# Patient Record
Sex: Male | Born: 1970 | Race: White | Hispanic: No | Marital: Married | Smoking: Never smoker
Health system: Southern US, Community
[De-identification: ages and names within clinical notes are randomized; demographics above are authoritative.]

## PROBLEM LIST (undated history)

## (undated) ENCOUNTER — Ambulatory Visit

## (undated) ENCOUNTER — Telehealth

## (undated) ENCOUNTER — Encounter: Attending: Hematology & Oncology | Primary: Hematology & Oncology

## (undated) ENCOUNTER — Telehealth: Attending: Oncology | Primary: Oncology

## (undated) ENCOUNTER — Encounter

## (undated) ENCOUNTER — Encounter: Attending: Radiation Oncology | Primary: Radiation Oncology

## (undated) ENCOUNTER — Telehealth: Attending: Surgical Oncology | Primary: Surgical Oncology

## (undated) ENCOUNTER — Encounter: Attending: Oncology | Primary: Oncology

## (undated) ENCOUNTER — Telehealth: Attending: Hematology & Oncology | Primary: Hematology & Oncology

## (undated) ENCOUNTER — Telehealth
Attending: Student in an Organized Health Care Education/Training Program | Primary: Student in an Organized Health Care Education/Training Program

## (undated) ENCOUNTER — Encounter: Attending: Surgery | Primary: Surgery

## (undated) ENCOUNTER — Telehealth: Attending: Radiation Oncology | Primary: Radiation Oncology

## (undated) ENCOUNTER — Ambulatory Visit: Payer: PRIVATE HEALTH INSURANCE

## (undated) ENCOUNTER — Ambulatory Visit: Payer: PRIVATE HEALTH INSURANCE | Attending: Surgical Oncology | Primary: Surgical Oncology

## (undated) ENCOUNTER — Ambulatory Visit: Payer: MEDICARE | Attending: Hematology & Oncology | Primary: Hematology & Oncology

## (undated) ENCOUNTER — Ambulatory Visit: Attending: Radiation Oncology | Primary: Radiation Oncology

## (undated) ENCOUNTER — Ambulatory Visit: Payer: PRIVATE HEALTH INSURANCE | Attending: Hematology & Oncology | Primary: Hematology & Oncology

## (undated) ENCOUNTER — Ambulatory Visit: Payer: PRIVATE HEALTH INSURANCE | Attending: Surgery | Primary: Surgery

## (undated) ENCOUNTER — Encounter: Payer: PRIVATE HEALTH INSURANCE | Attending: Hematology & Oncology | Primary: Hematology & Oncology

## (undated) ENCOUNTER — Encounter: Attending: Surgical Oncology | Primary: Surgical Oncology

## (undated) ENCOUNTER — Ambulatory Visit: Payer: PRIVATE HEALTH INSURANCE | Attending: Radiation Oncology | Primary: Radiation Oncology

## (undated) ENCOUNTER — Ambulatory Visit: Payer: MEDICARE

## (undated) ENCOUNTER — Telehealth: Attending: MS" | Primary: MS"

## (undated) ENCOUNTER — Ambulatory Visit: Attending: Oncology | Primary: Oncology

## (undated) ENCOUNTER — Ambulatory Visit: Attending: Surgical Oncology | Primary: Surgical Oncology

## (undated) ENCOUNTER — Encounter: Attending: Physical Medicine & Rehabilitation | Primary: Physical Medicine & Rehabilitation

## (undated) ENCOUNTER — Telehealth: Attending: Internal Medicine | Primary: Internal Medicine

## (undated) ENCOUNTER — Ambulatory Visit: Attending: Internal Medicine | Primary: Internal Medicine

## (undated) DIAGNOSIS — Z9641 Presence of insulin pump (external) (internal): Secondary | ICD-10-CM

## (undated) DIAGNOSIS — A4902 Methicillin resistant Staphylococcus aureus infection, unspecified site: Secondary | ICD-10-CM

## (undated) DIAGNOSIS — E119 Type 2 diabetes mellitus without complications: Secondary | ICD-10-CM

## (undated) HISTORY — PX: EYE SURGERY: SHX253

## (undated) MED ORDER — LORATADINE 10 MG TABLET: Freq: Every day | ORAL | 0.00 days | Status: SS | PRN

## (undated) MED ORDER — CETIRIZINE 10 MG TABLET: Freq: Every day | ORAL | 0.00000 days | PRN

## (undated) MED ORDER — LOPERAMIDE 2 MG CAPSULE
Freq: Four times a day (QID) | ORAL | 0 days | PRN
Start: ? — End: 2020-02-15

---

## 2005-08-16 ENCOUNTER — Ambulatory Visit: Payer: Self-pay | Admitting: Family Medicine

## 2005-09-08 ENCOUNTER — Ambulatory Visit: Payer: Self-pay | Admitting: Family Medicine

## 2010-05-28 ENCOUNTER — Ambulatory Visit: Payer: Self-pay | Admitting: Family Medicine

## 2012-11-22 IMAGING — US US EXTREM LOW VENOUS*L*
1 series · 17 of 21 positions shown · non-contrast
Comparison: none

REASON FOR EXAM: CR  [id number]  left leg pain swelling eval for DVT
COMMENTS:

PROCEDURE:     US  - US DOPPLER LOW EXTR LEFT  - May 28, 2010  [DATE]
RESULT:     Comparison: None

[Series 1: us extrem low venous*left* · 17 of 21 slices shown]
[im 1/21]
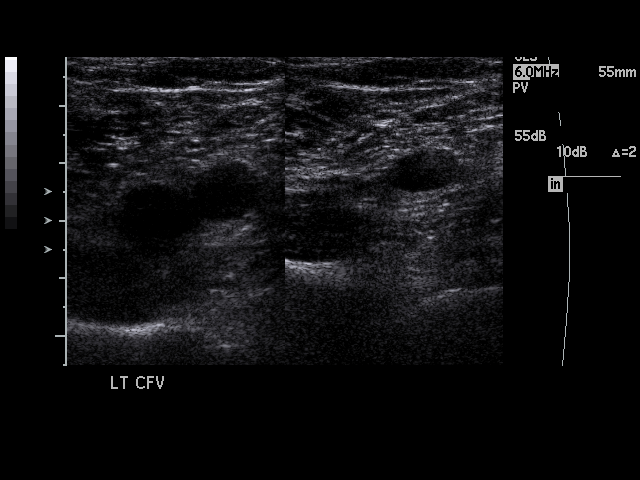
[im 2/21]
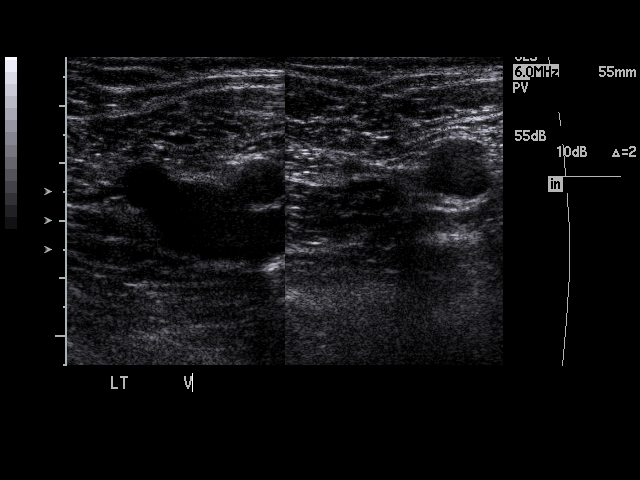
[im 4/21]
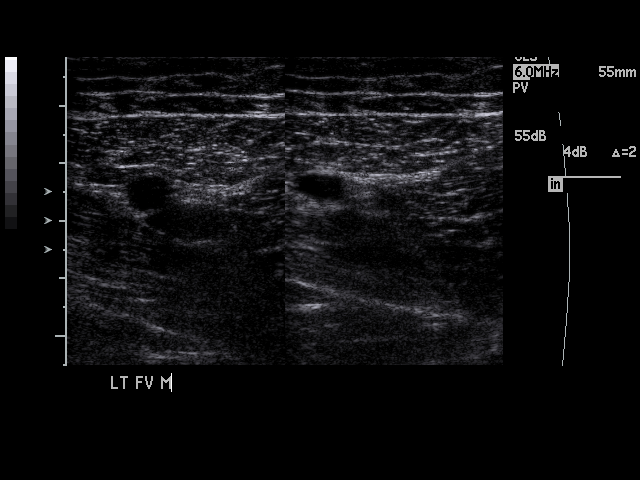
[im 5/21]
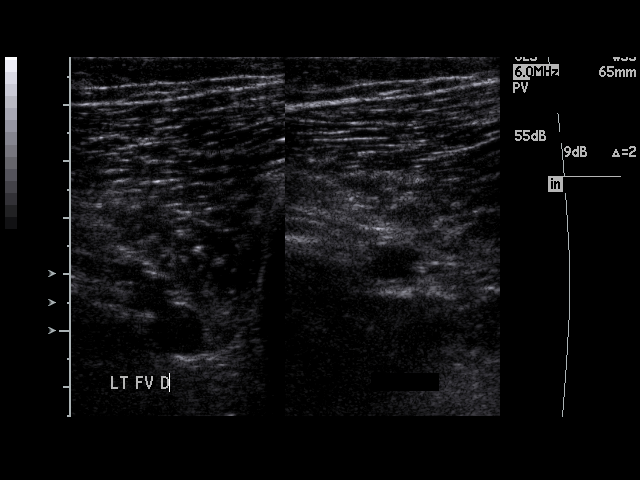
[im 6/21]
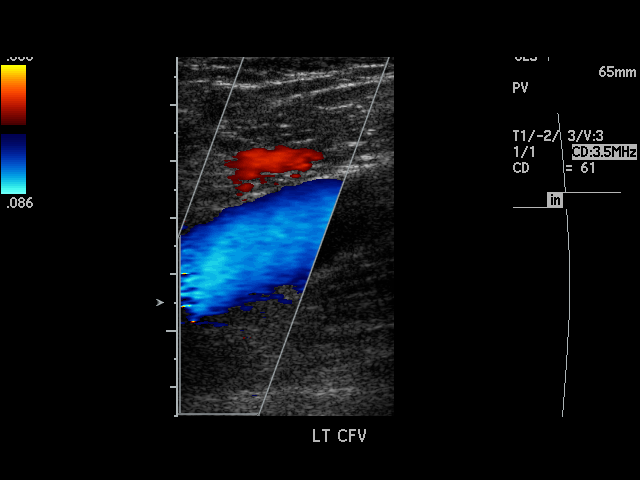
[im 7/21]
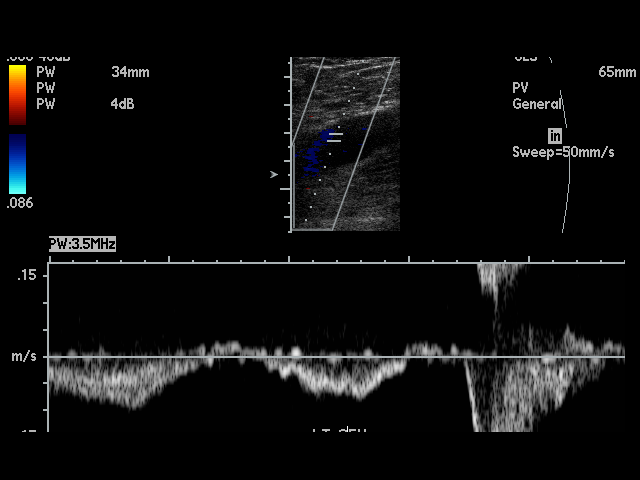
[im 9/21]
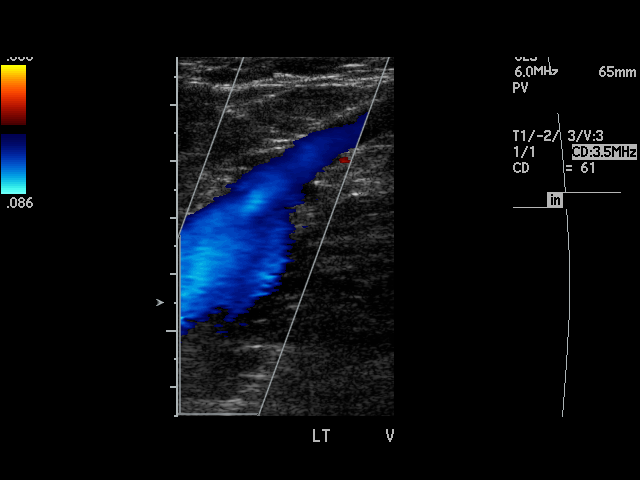
[im 10/21]
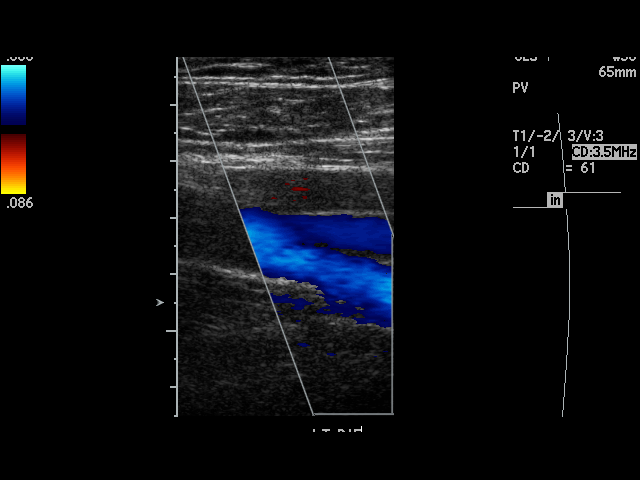
[im 11/21]
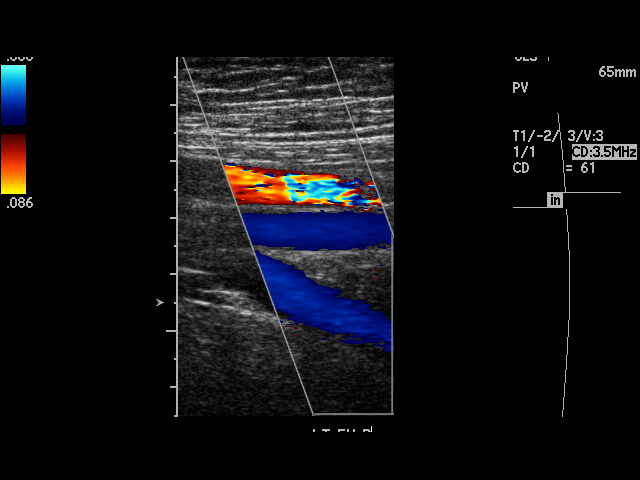
[im 12/21]
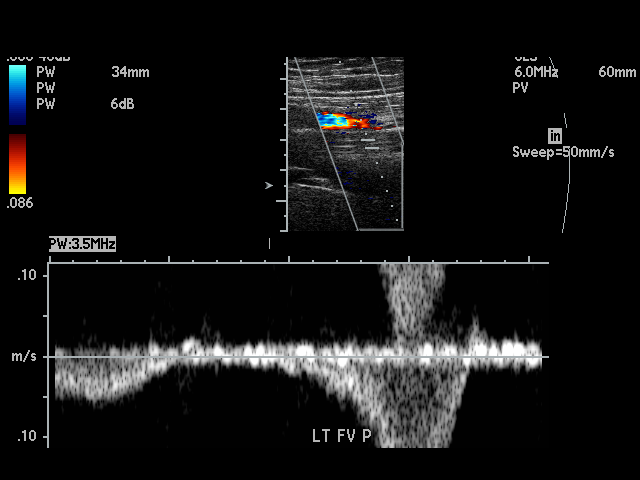
[im 13/21]
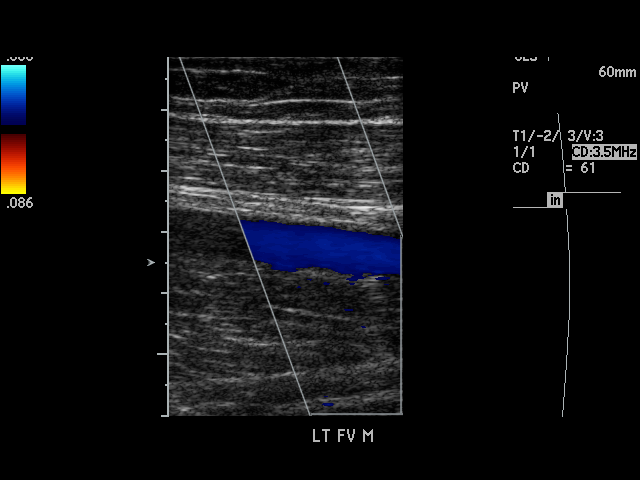
[im 15/21]
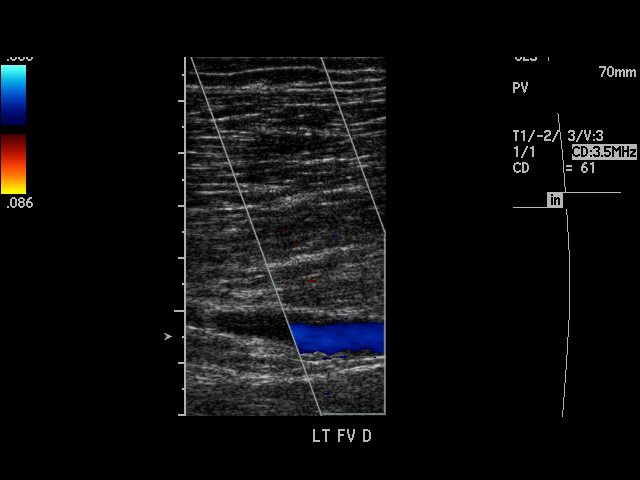
[im 16/21]
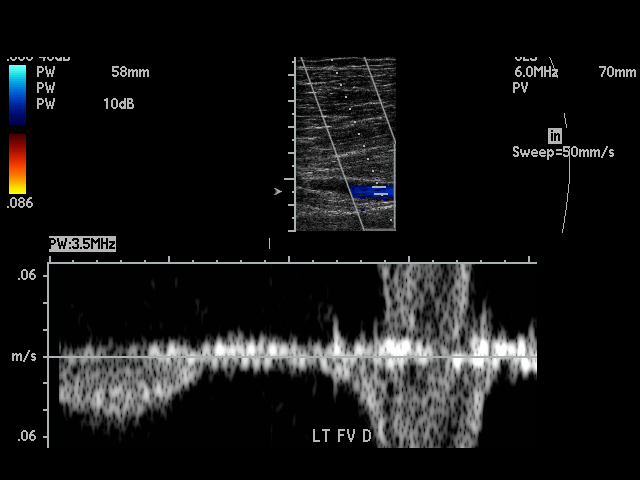
[im 17/21]
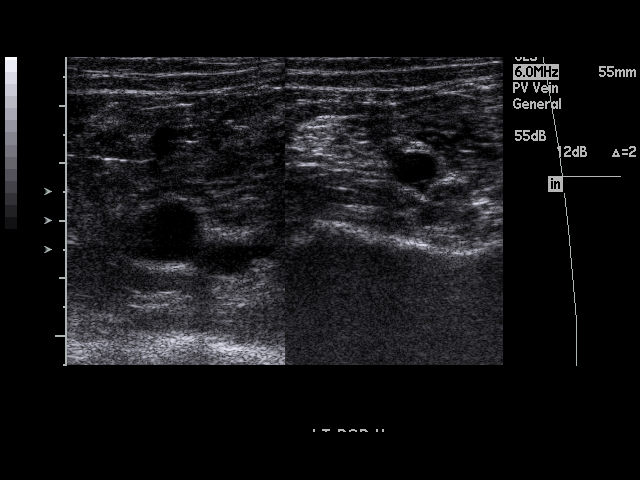
[im 18/21]
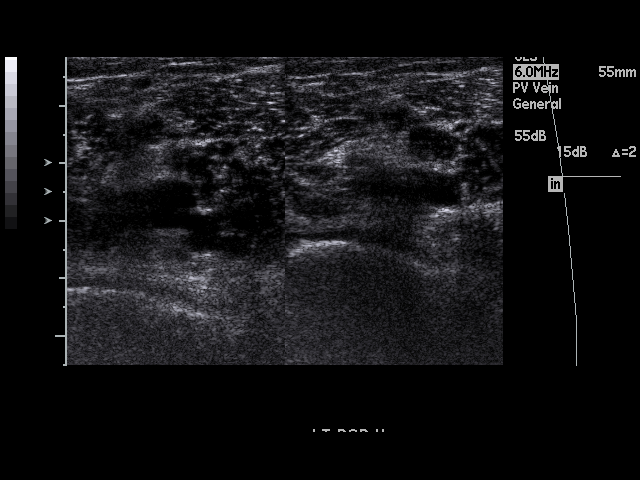
[im 20/21]
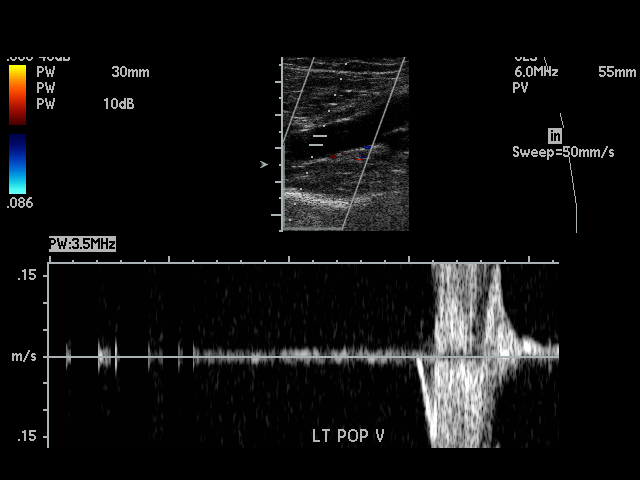
[im 21/21]
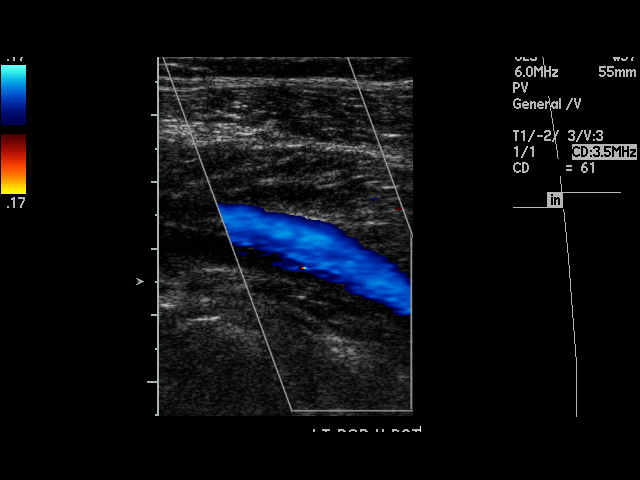

[17 of 21 positions shown; findings below may reference images not displayed]

FINDINGS: Multiple longitudinal and transverse gray-scale as well as color
and spectral Doppler images of the left lower extremity veins were obtained
from the common femoral veins through the popliteal veins.

The left common femoral, greater saphenous, femoral, popliteal veins, and
venous trifurcation are patent, demonstrating normal color-flow and
compressibility. No intraluminal thrombus is identified.There is normal
respiratory variation and augmentation demonstrated at all vein levels.
IMPRESSION: No evidence of DVT in the left lower extremity.

## 2014-04-09 DIAGNOSIS — A4902 Methicillin resistant Staphylococcus aureus infection, unspecified site: Secondary | ICD-10-CM

## 2014-04-09 HISTORY — DX: Methicillin resistant Staphylococcus aureus infection, unspecified site: A49.02

## 2014-07-19 ENCOUNTER — Observation Stay
Admission: EM | Admit: 2014-07-19 | Discharge: 2014-07-20 | Disposition: A | Discharge status: Home / Self Care | Payer: BC Managed Care – PPO | Attending: Surgery | Admitting: Surgery

## 2014-07-19 ENCOUNTER — Encounter: Payer: Self-pay | Admitting: Emergency Medicine

## 2014-07-19 DIAGNOSIS — Z794 Long term (current) use of insulin: Secondary | ICD-10-CM | POA: Insufficient documentation

## 2014-07-19 DIAGNOSIS — E876 Hypokalemia: Secondary | ICD-10-CM | POA: Diagnosis present

## 2014-07-19 DIAGNOSIS — E109 Type 1 diabetes mellitus without complications: Secondary | ICD-10-CM | POA: Insufficient documentation

## 2014-07-19 DIAGNOSIS — Z79899 Other long term (current) drug therapy: Secondary | ICD-10-CM | POA: Insufficient documentation

## 2014-07-19 DIAGNOSIS — Z91013 Allergy to seafood: Secondary | ICD-10-CM | POA: Diagnosis not present

## 2014-07-19 DIAGNOSIS — I10 Essential (primary) hypertension: Secondary | ICD-10-CM | POA: Insufficient documentation

## 2014-07-19 DIAGNOSIS — L02215 Cutaneous abscess of perineum: Principal | ICD-10-CM | POA: Diagnosis present

## 2014-07-19 DIAGNOSIS — F329 Major depressive disorder, single episode, unspecified: Secondary | ICD-10-CM | POA: Insufficient documentation

## 2014-07-19 HISTORY — DX: Presence of insulin pump (external) (internal): Z96.41

## 2014-07-19 HISTORY — DX: Type 2 diabetes mellitus without complications: E11.9

## 2014-07-19 HISTORY — DX: Methicillin resistant Staphylococcus aureus infection, unspecified site: A49.02

## 2014-07-19 LAB — BASIC METABOLIC PANEL
Anion gap: 10 (ref 5–15)
BUN: 22 mg/dL — ABNORMAL HIGH (ref 6–20)
CALCIUM: 8.7 mg/dL — AB (ref 8.9–10.3)
CO2: 26 mmol/L (ref 22–32)
Chloride: 102 mmol/L (ref 101–111)
Creatinine, Ser: 1.02 mg/dL (ref 0.61–1.24)
GFR calc Af Amer: >60 mL/min (ref >60)
GLUCOSE: 69 mg/dL (ref 65–99)
Potassium: 3.3 mmol/L — ABNORMAL LOW (ref 3.5–5.1)
Sodium: 138 mmol/L (ref 135–145)

## 2014-07-19 LAB — CBC WITH DIFFERENTIAL/PLATELET
Basophils Absolute: 0 10*3/uL (ref 0–0.1)
Basophils Relative: 0 %
EOS PCT: 0 %
Eosinophils Absolute: 0 10*3/uL (ref 0–0.7)
HCT: 47.2 % (ref 40.0–52.0)
Hemoglobin: 15.9 g/dL (ref 13.0–18.0)
LYMPHS PCT: 28 %
Lymphs Abs: 3.1 10*3/uL (ref 1.0–3.6)
MCH: 29.9 pg (ref 26.0–34.0)
MCHC: 33.6 g/dL (ref 32.0–36.0)
MCV: 89 fL (ref 80.0–100.0)
MONOS PCT: 9 %
Monocytes Absolute: 1 10*3/uL (ref 0.2–1.0)
NEUTROS PCT: 63 %
Neutro Abs: 7 10*3/uL — ABNORMAL HIGH (ref 1.4–6.5)
Platelets: 171 10*3/uL (ref 150–440)
RBC: 5.3 MIL/uL (ref 4.40–5.90)
RDW: 12.9 % (ref 11.5–14.5)
WBC: 11.1 10*3/uL — ABNORMAL HIGH (ref 3.8–10.6)

## 2014-07-19 LAB — GLUCOSE, CAPILLARY: Glucose-Capillary: 73 mg/dL (ref 65–99)

## 2014-07-19 MED ORDER — CLINDAMYCIN PHOSPHATE 600 MG/50ML IV SOLN
600.0000 mg | Freq: Three times a day (TID) | INTRAVENOUS | Status: DC
  Administered 2014-07-19 – 2014-07-20 (×2): 600 mg via INTRAVENOUS
  Filled 2014-07-19 (×5): qty 50

## 2014-07-19 MED ORDER — CEFTRIAXONE SODIUM IN DEXTROSE 20 MG/ML IV SOLN
1.0000 g | Freq: Once | INTRAVENOUS | Status: AC
  Administered 2014-07-19: 1 g via INTRAVENOUS

## 2014-07-19 MED ORDER — CLINDAMYCIN PHOSPHATE 600 MG/50ML IV SOLN
INTRAVENOUS | Status: AC
  Administered 2014-07-19: 600 mg via INTRAVENOUS
  Filled 2014-07-19: qty 50

## 2014-07-19 MED ORDER — POTASSIUM CHLORIDE 10 MEQ/100ML IV SOLN
10.0000 meq | Freq: Once | INTRAVENOUS | Status: AC
  Administered 2014-07-19: 10 meq via INTRAVENOUS
  Filled 2014-07-19 (×2): qty 100

## 2014-07-19 MED ORDER — DEXTROSE 5 % IV SOLN
Freq: Once | INTRAVENOUS | Status: AC
  Administered 2014-07-19: 20:00:00 via INTRAVENOUS

## 2014-07-19 MED ORDER — CEFTRIAXONE SODIUM IN DEXTROSE 20 MG/ML IV SOLN
INTRAVENOUS | Status: AC
  Administered 2014-07-19: 1 g via INTRAVENOUS
  Filled 2014-07-19: qty 50

## 2014-07-19 NOTE — ED Notes (Signed)
Abscess to groin area, hx of DM I, from [facility].  Was told he would need IV abx.  Denies chills, but did have fever of 100.7 at [facility].

## 2014-07-19 NOTE — ED Notes (Signed)
Surgeon in to see pt 

## 2014-07-19 NOTE — ED Notes (Signed)
Pt updated on or transit time. Spouse at bedside. Pt declines further needs at this time. Call bell at side.

## 2014-07-19 NOTE — ED Provider Notes (Signed)
CSN: [id number]     Arrival date & time 07/19/14  1727 History   First MD Initiated Contact with Patient 07/19/14 1841     Chief Complaint  Patient presents with  . Abscess     (Consider location/radiation/quality/duration/timing/severity/associated sxs/prior Treatment) Patient is a 44 y.o. male presenting with abscess.  Abscess Associated symptoms: no fever, no headaches, no nausea and no vomiting      44 year old male presents to the emergency department for evaluation of abscess to the perineal region. Patient states 3 days ago he noticed some discomfort in his perineal region. One day ago he noticed a large fluctuant area with swelling. Pain has progressively gotten worse and is currently moderate. He denies any fevers. He has not taken any medications for pain. Patient does have a history of type 1 diabetes as well as previous MRSA infection.  Past Medical History  Diagnosis Date  . Diabetes mellitus without complication   . MRSA (methicillin resistant Staphylococcus aureus) 3/16  . Insulin pump in place    Past Surgical History  Procedure Laterality Date  . Eye surgery     History reviewed. No pertinent family history. History  Substance Use Topics  . Smoking status: Never Smoker   . Smokeless tobacco: Not on file  . Alcohol Use: Yes     Comment: socially    Review of Systems  Constitutional: Negative.  Negative for fever, chills, activity change and appetite change.  HENT: Negative for congestion, ear pain, mouth sores, rhinorrhea, sinus pressure, sore throat and trouble swallowing.   Eyes: Negative for photophobia, pain and discharge.  Respiratory: Negative for cough, chest tightness and shortness of breath.   Cardiovascular: Negative for chest pain and leg swelling.  Gastrointestinal: Negative for nausea, vomiting, abdominal pain, diarrhea and abdominal distention.  Genitourinary: Negative for dysuria and difficulty urinating.  Musculoskeletal: Negative for back  pain, arthralgias and gait problem.  Skin: Positive for color change (Localized area of soft tissue swelling with tenderness to the perineal region).  Neurological: Negative for dizziness and headaches.  Hematological: Negative for adenopathy.  Psychiatric/Behavioral: Negative for behavioral problems and agitation.      Allergies  Shellfish allergy  Home Medications   Prior to Admission medications   Not on File   BP 136/73 mmHg  Pulse 83  Temp(Src) 98.4 F (36.9 C) (Oral)  Resp 18  Ht 5\' 11"  (1.803 m)  Wt 234 lb (106.142 kg)  BMI 32.65 kg/m2  SpO2 96% Physical Exam  Constitutional: He is oriented to person, place, and time. He appears well-developed and well-nourished.  HENT:  Head: Normocephalic and atraumatic.  Eyes: Conjunctivae and EOM are normal. Pupils are equal, round, and reactive to light.  Neck: Normal range of motion. Neck supple.  Cardiovascular: Normal rate.   Pulmonary/Chest: Effort normal. No respiratory distress.  Abdominal: Soft. Bowel sounds are normal. He exhibits no distension. There is no tenderness.  Genitourinary: Testes normal and penis normal. Right testis shows no mass, no swelling and no tenderness. Left testis shows no mass, no swelling and no tenderness. No penile tenderness. No discharge found.  Perineal region has a 6 x 4 cm elliptical-shaped soft tissue mass with erythema and tenderness. There is no drainage. Soft tissue mass is fluctuant. No surrounding erythema or cellulitis. No tenderness around the rectum. No scrotal tenderness. Skin shows no signs of necrotic tissue.  Musculoskeletal: Normal range of motion. He exhibits no edema or tenderness.  Neurological: He is alert and oriented to person, place, and  time.  Skin: Skin is warm and dry.  Psychiatric: He has a normal mood and affect. His behavior is normal. Judgment and thought content normal.    ED Course  Procedures (including critical care time) Labs Review Labs Reviewed  CBC  WITH DIFFERENTIAL/PLATELET - Abnormal; Notable for the following:    WBC 11.1 (*)    Neutro Abs 7.0 (*)    All other components within normal limits  BASIC METABOLIC PANEL - Abnormal; Notable for the following:    Potassium 3.3 (*)    BUN 22 (*)    Calcium 8.7 (*)    All other components within normal limits  GLUCOSE, CAPILLARY    Imaging Review No results found.   EKG Interpretation None      MDM   Final diagnoses:  Perineal abscess  Hypokalemia    1. Consulted Psychologist, sport and exercise. Surgeon examined patient. It was decided that patient would be admitted and go to the OR tonight for incision and drainage. Patient was given potassium IV, dextrose 5%, placed nothing by mouth.  Duanne Guess, PA-C 07/19/14 2024  Harvest Dark, MD 07/19/14 2325

## 2014-07-19 NOTE — ED Notes (Addendum)
Pt states that he has a huge abscess on his groin area that has been getting bigger.It started on Tuesday.  Circulation and airway intact.

## 2014-07-19 NOTE — H&P (Signed)
Surgery History and physical  CC: perineal pain, swelling  HPI: 44 yo patient with DM and insulin pump who presents with 3 days of increasingly worse perineal pain, swelling.  Began suddenly.  No drainage or redness.  No fevers/chills, night sweats, shortness of breath, cough, chest pain, abdominal pain, nausea/vomiting, diarrhea/constipation, dysuria/hematuria.  Does feel like his BG is low and fingerstick showed BG of 73.  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  Last PO was a hot dog 4 hours ago.    Active Ambulatory Problems    Diagnosis Date Noted  . No Active Ambulatory Problems   Resolved Ambulatory Problems    Diagnosis Date Noted  . No Resolved Ambulatory Problems   Past Medical History  Diagnosis Date  . Diabetes mellitus without complication   . MRSA (methicillin resistant Staphylococcus aureus) 3/16  . Insulin pump in place      Medication List    ASK your doctor about these medications        citalopram 40 MG tablet  Commonly known as:  CELEXA  Take 40 mg by mouth daily.     insulin pump Soln  1 each by Other route continuous. Pt uses Novolog.     losartan 50 MG tablet  Commonly known as:  COZAAR  Take 50 mg by mouth daily.     simvastatin 40 MG tablet  Commonly known as:  ZOCOR  Take 40 mg by mouth daily.        History   Social History  . Marital Status: Married    Spouse Name: N/A  . Number of Children: N/A  . Years of Education: N/A   Occupational History  . Not on file.   Social History Main Topics  . Smoking status: Never Smoker   . Smokeless tobacco: Not on file  . Alcohol Use: Yes     Comment: socially  . Drug Use: No  . Sexual Activity: Not on file   Other Topics Concern  . Not on file   Social History Narrative  . No narrative on file   Allergies  Allergen Reactions  . Shellfish Allergy Hives   ROS: Full ROS obtained, pertinent positives and negatives as above.  Blood pressure 136/73, pulse 83,  temperature 98.4 F (36.9 C), temperature source Oral, resp. rate 18, height 5\' 11"  (1.803 m), weight 106.142 kg (234 lb), SpO2 96 %. GEN: NAD/A&Ox3 FACE: no obvious facial trauma, normal external nose, normal external ears EYES: no scleral icterus, no conjunctivitis HEAD: normocephalic atraumatic CV: RRR, no MRG RESP: moving air well, lungs clear ABD: soft, nontender, nondistended PERINEUM: approx 5 x 5 tender mass at base of scrotum, no puncta, min erythema, no purulence EXT: moving all ext well, strength 5/5 NEURO: cnII-XII grossly intact, sensation intact all 4 ext  Labs: Reviewed, pertinent positives include K 3.3 WBC 11.1, 63% Neutrophils  A/P 44 yo DM male with perineal abscess. Will plan I and D of perineal abscess tonight as I feel that making NPO after midnight may require more significant BG monitoring.  I have discussed procedure with him and he would like to proceed.

## 2014-07-20 ENCOUNTER — Emergency Department: Payer: BC Managed Care – PPO | Admitting: Registered Nurse

## 2014-07-20 ENCOUNTER — Inpatient Hospital Stay: Admit: 2014-07-20 | Payer: Self-pay | Admitting: Surgery

## 2014-07-20 ENCOUNTER — Encounter
Admission: EM | Disposition: A | Discharge status: Home / Self Care | Payer: Self-pay | Source: Home / Self Care | Attending: Surgery

## 2014-07-20 DIAGNOSIS — L02215 Cutaneous abscess of perineum: Secondary | ICD-10-CM | POA: Diagnosis not present

## 2014-07-20 HISTORY — PX: INCISION AND DRAINAGE PERIRECTAL ABSCESS: SHX1804

## 2014-07-20 LAB — GLUCOSE, CAPILLARY
GLUCOSE-CAPILLARY: 174 mg/dL — AB (ref 65–99)
GLUCOSE-CAPILLARY: 202 mg/dL — AB (ref 65–99)
Glucose-Capillary: 157 mg/dL — ABNORMAL HIGH (ref 65–99)

## 2014-07-20 SURGERY — INCISION AND DRAINAGE, ABSCESS, PERIRECTAL
Anesthesia: General | Wound class: Contaminated

## 2014-07-20 MED ORDER — FENTANYL CITRATE (PF) 100 MCG/2ML IJ SOLN: 25.0000 ug | INTRAMUSCULAR | Status: DC | PRN

## 2014-07-20 MED ORDER — OXYCODONE-ACETAMINOPHEN 5-325 MG PO TABS: 1.0000 | ORAL_TABLET | ORAL | Status: DC | PRN

## 2014-07-20 MED ORDER — GLYCOPYRROLATE 0.2 MG/ML IJ SOLN
INTRAMUSCULAR | Status: DC | PRN
  Administered 2014-07-20: 0.2 mg via INTRAVENOUS

## 2014-07-20 MED ORDER — MIDAZOLAM HCL 2 MG/2ML IJ SOLN
INTRAMUSCULAR | Status: DC | PRN
  Administered 2014-07-20: 2 mg via INTRAVENOUS

## 2014-07-20 MED ORDER — DEXTROSE 5 % IV SOLN
INTRAVENOUS | Status: DC | PRN
  Administered 2014-07-20: 02:00:00 via INTRAVENOUS

## 2014-07-20 MED ORDER — SODIUM CHLORIDE 0.9 % IV SOLN
INTRAVENOUS | Status: DC
  Administered 2014-07-20: 03:00:00 via INTRAVENOUS

## 2014-07-20 MED ORDER — MORPHINE SULFATE 2 MG/ML IJ SOLN: 2.0000 mg | INTRAMUSCULAR | Status: DC | PRN

## 2014-07-20 MED ORDER — ONDANSETRON HCL 4 MG/2ML IJ SOLN: 4.0000 mg | Freq: Once | INTRAMUSCULAR | Status: DC | PRN

## 2014-07-20 MED ORDER — FENTANYL CITRATE (PF) 100 MCG/2ML IJ SOLN
INTRAMUSCULAR | Status: DC | PRN
  Administered 2014-07-20: 25 ug via INTRAVENOUS
  Administered 2014-07-20: 50 ug via INTRAVENOUS
  Administered 2014-07-20: 25 ug via INTRAVENOUS

## 2014-07-20 MED ORDER — CLINDAMYCIN HCL 300 MG PO CAPS: 600.0000 mg | ORAL_CAPSULE | Freq: Three times a day (TID) | ORAL | Status: DC

## 2014-07-20 MED ORDER — LIDOCAINE-EPINEPHRINE 1 %-1:100000 IJ SOLN
INTRAMUSCULAR | Status: AC
  Filled 2014-07-20: qty 1

## 2014-07-20 MED ORDER — LIDOCAINE HCL (CARDIAC) 20 MG/ML IV SOLN
INTRAVENOUS | Status: DC | PRN
  Administered 2014-07-20: 100 mg via INTRAVENOUS

## 2014-07-20 MED ORDER — PROPOFOL 10 MG/ML IV BOLUS
INTRAVENOUS | Status: DC | PRN
  Administered 2014-07-20: 260 mg via INTRAVENOUS

## 2014-07-20 MED ORDER — HYDROCODONE-ACETAMINOPHEN 5-325 MG PO TABS: 1.0000 | ORAL_TABLET | Freq: Four times a day (QID) | ORAL | Status: DC | PRN

## 2014-07-20 MED ORDER — LIDOCAINE-EPINEPHRINE 1 %-1:100000 IJ SOLN
INTRAMUSCULAR | Status: DC | PRN
  Administered 2014-07-20: 10 mL

## 2014-07-20 MED ORDER — CLINDAMYCIN HCL 150 MG PO CAPS
600.0000 mg | ORAL_CAPSULE | Freq: Three times a day (TID) | ORAL | Status: DC
  Administered 2014-07-20: 600 mg via ORAL
  Filled 2014-07-20 (×3): qty 2
  Filled 2014-07-20: qty 4
  Filled 2014-07-20 (×2): qty 2

## 2014-07-20 SURGICAL SUPPLY — 12 items
CANISTER SUCT 1200ML W/VALVE (MISCELLANEOUS) ×2 IMPLANT
DRAIN PENROSE 1/4X12 LTX (DRAIN) ×2 IMPLANT
GAUZE SPONGE 4X4 12PLY STRL (GAUZE/BANDAGES/DRESSINGS) ×2 IMPLANT
GLOVE BIO SURGEON STRL SZ7.5 (GLOVE) ×2 IMPLANT
GOWN STRL REUS W/ TWL LRG LVL3 (GOWN DISPOSABLE) ×1 IMPLANT
GOWN STRL REUS W/TWL LRG LVL3 (GOWN DISPOSABLE) ×1
NEEDLE HYPO 22GX1.5 SAFETY (NEEDLE) ×2 IMPLANT
PACK BASIN MINOR ARMC (MISCELLANEOUS) ×2 IMPLANT
PAD ABD DERMACEA PRESS 5X9 (GAUZE/BANDAGES/DRESSINGS) ×2 IMPLANT
SUT ETHILON 3-0 FS-10 30 BLK (SUTURE) ×2
SUTURE EHLN 3-0 FS-10 30 BLK (SUTURE) ×1 IMPLANT
SYRINGE 10CC LL (SYRINGE) ×2 IMPLANT

## 2014-07-20 NOTE — ED Notes (Signed)
Report to dawn, rn.  

## 2014-07-20 NOTE — ED Notes (Signed)
Pt up to restroom to void.  

## 2014-07-20 NOTE — Op Note (Signed)
Preoperative diagnosis: Perineal Abscess Postoperative diagnosis: Perineal Abscess Procedure performed: Incision and drainage perineal abscess Surgeon: Ritha Sampedro Anesthesia: General LMA EBL: 10 ml Specimen: None  Indication for procedure: Nicholas Marsh is a pleasant 44 yo diabetic male who presents with 3 days of perineal pain, swelling and fluctuance.  He was brought to the OR for incision and drainage of perineal abscess.  Details of procedure:  Informed consent was obtained.  Nicholas Marsh was brought to the operating room suite and laid supine on the OR table.  He was induced, LMA was placed and general anesthesia was administered.  His legs were then placed in stirrups and his perineum was prepped and draped.  Timeout was performed identifying patient's name, operative site and procedure to be performed.  He was noted to have an approx 4 x 4 x 2 cm area in his right perineum posterior to his scrotum.  This area was infiltrated with 1% lidocaine and incised anteriorly and posteriorly.  The cavity was then probed with a hemostat to break up any septations.  The cavity was irrigated and a penrose was placed through the cavity and sutured into place with 3-0 nylon sutures.  It was then packed with 1/2 inch iodoform gauze.  A sterile dressing was then placed over the wound.  The patient was then awakened, LMA was removed and he was transferred to PACU.  There were no immediate complications.  Needle, sponge and instrument count was correct at the end of the procedure.

## 2014-07-20 NOTE — Anesthesia Procedure Notes (Signed)
Procedure Name: LMA Insertion Date/Time: 07/20/2014 1:39 AM Performed by: Doreen Salvage Pre-anesthesia Checklist: Patient identified, Patient being monitored, Timeout performed, Emergency Drugs available and Suction available Patient Re-evaluated:Patient Re-evaluated prior to inductionOxygen Delivery Method: Circle system utilized Preoxygenation: Pre-oxygenation with 100% oxygen Intubation Type: IV induction Ventilation: Mask ventilation without difficulty LMA: LMA inserted LMA Size: 4.5 Tube type: Oral Number of attempts: 1 Placement Confirmation: positive ETCO2 and breath sounds checked- equal and bilateral Tube secured with: Tape Dental Injury: Teeth and Oropharynx as per pre-operative assessment

## 2014-07-20 NOTE — Discharge Instructions (Signed)
Do not drive on pain medications Call or return to ER if you develop fever greater than 101.5, increased pain, redness/drainage from incisions Take gauze packing out in 48 hours.  Okay to shower or take baths

## 2014-07-20 NOTE — Transfer of Care (Signed)
Immediate Anesthesia Transfer of Care Note  Patient: Nicholas Marsh  Procedure(s) Performed: Procedure(s): IRRIGATION AND DEBRIDEMENT PERIRECTAL ABSCESS (N/A)  Patient Location: PACU  Anesthesia Type:General  Level of Consciousness: sedated  Airway & Oxygen Therapy: Patient Spontanous Breathing and Patient connected to face mask oxygen  Post-op Assessment: Report given to RN and Post -op Vital signs reviewed and stable  Post vital signs: Reviewed and stable  Last Vitals:  Filed Vitals:   07/20/14 0218  BP: 125/66  Pulse: 86  Temp: 37.4 C  Resp: 20    Complications: No apparent anesthesia complications

## 2014-07-20 NOTE — Anesthesia Preprocedure Evaluation (Signed)
Anesthesia Evaluation  Patient identified by MRN, date of birth, ID band Patient awake    Reviewed: Allergy & Precautions, NPO status , Patient's Chart, lab work & pertinent test results  Airway Mallampati: III  TM Distance: >3 FB Neck ROM: Full    Dental  (+) Teeth Intact   Pulmonary          Cardiovascular hypertension, Pt. on medications     Neuro/Psych Depression    GI/Hepatic   Endo/Other  diabetes, Type 1, Insulin Dependent  Renal/GU      Musculoskeletal   Abdominal   Peds  Hematology   Anesthesia Other Findings   Reproductive/Obstetrics                             Anesthesia Physical Anesthesia Plan  ASA: III  Anesthesia Plan: General   Post-op Pain Management:    Induction: Intravenous  Airway Management Planned: LMA  Additional Equipment:   Intra-op Plan:   Post-operative Plan:   Informed Consent: I have reviewed the patients History and Physical, chart, labs and discussed the procedure including the risks, benefits and alternatives for the proposed anesthesia with the patient or authorized representative who has indicated his/her understanding and acceptance.     Plan Discussed with:   Anesthesia Plan Comments:         Anesthesia Quick Evaluation

## 2014-07-20 NOTE — ED Notes (Signed)
Pt encouraged to let rn know if he need pain medication. Pt verbalizes understanding.

## 2014-07-20 NOTE — Anesthesia Postprocedure Evaluation (Signed)
  Anesthesia Post-op Note  Patient: Nicholas Marsh  Procedure(s) Performed: Procedure(s): IRRIGATION AND DEBRIDEMENT PERINEAL ABSCESS (N/A)  Anesthesia type:General  Patient location: PACU  Post pain: Pain level controlled  Post assessment: Post-op Vital signs reviewed, Patient's Cardiovascular Status Stable, Respiratory Function Stable, Patent Airway and No signs of Nausea or vomiting  Post vital signs: Reviewed and stable  Last Vitals:  Filed Vitals:   07/20/14 0218  BP: 125/66  Pulse: 86  Temp: 37.4 C  Resp: 20    Level of consciousness: awake, alert  and patient cooperative  Complications: No apparent anesthesia complications

## 2014-07-22 ENCOUNTER — Encounter: Payer: Self-pay | Admitting: Surgery

## 2014-07-22 ENCOUNTER — Telehealth: Payer: Self-pay | Admitting: Surgery

## 2014-07-22 NOTE — Telephone Encounter (Signed)
Pt has packing that needs to be removed in the next couple of days. Please call patient to schedule per [facility]

## 2014-07-22 NOTE — Telephone Encounter (Signed)
Returned patient call and scheduled an nurse visit for 07/23/14 at 0900 in the [facility] office to remove packing. Patient confirmed appointment date, place, and time.

## 2014-07-23 ENCOUNTER — Ambulatory Visit (INDEPENDENT_AMBULATORY_CARE_PROVIDER_SITE_OTHER): Payer: BC Managed Care – PPO | Admitting: Surgery

## 2014-07-23 VITALS — BP 121/78 | HR 62 | Temp 98.0°F | Resp 18 | Ht 71.0 in | Wt 231.0 lb

## 2014-07-23 DIAGNOSIS — N340 Urethral abscess: Secondary | ICD-10-CM

## 2014-07-23 NOTE — Progress Notes (Deleted)
Subjective:     Patient ID: Nicholas Marsh, male   DOB: 1971/01/14, 44 y.o.   MRN: [id number]  HPI   Review of Systems     Objective:   Physical Exam     Assessment:     ***    Plan:     ***

## 2014-08-02 NOTE — Progress Notes (Signed)
Surgery Clinic Note  S: Doing well. Min pain.  Drain removed. Blood pressure 121/78, pulse 62, temperature 98 F (36.7 C), temperature source Oral, resp. rate 18, height 5\' 11"  (1.803 m), weight 231 lb (104.781 kg). GEN: NAD/A&Ox3 GROIN: Min induration, erythema/purulence  A/P 44 yo s/p I and D of perineal abscess, doing well - f/u prn.

## 2014-12-04 ENCOUNTER — Encounter: Payer: Self-pay | Admitting: Surgery

## 2014-12-04 NOTE — Patient Instructions (Signed)
Do not drive on pain medications °Call or return to ER if you develop fever greater than 101.5, nausea/vomiting, increased pain, redness/drainage from incisions ° ° °

## 2017-10-12 ENCOUNTER — Ambulatory Visit: Payer: BC Managed Care – PPO | Admitting: Anesthesiology

## 2017-10-12 ENCOUNTER — Ambulatory Visit
Admission: RE | Admit: 2017-10-12 | Discharge: 2017-10-12 | Disposition: A | Discharge status: Home / Self Care | Payer: BC Managed Care – PPO | Source: Ambulatory Visit | Attending: Internal Medicine | Admitting: Internal Medicine

## 2017-10-12 ENCOUNTER — Encounter
Admission: RE | Disposition: A | Discharge status: Home / Self Care | Payer: Self-pay | Source: Ambulatory Visit | Attending: Internal Medicine

## 2017-10-12 DIAGNOSIS — D120 Benign neoplasm of cecum: Secondary | ICD-10-CM | POA: Insufficient documentation

## 2017-10-12 DIAGNOSIS — K921 Melena: Secondary | ICD-10-CM | POA: Insufficient documentation

## 2017-10-12 DIAGNOSIS — Z91013 Allergy to seafood: Secondary | ICD-10-CM | POA: Diagnosis not present

## 2017-10-12 DIAGNOSIS — Z9641 Presence of insulin pump (external) (internal): Secondary | ICD-10-CM | POA: Diagnosis not present

## 2017-10-12 DIAGNOSIS — C20 Malignant neoplasm of rectum: Secondary | ICD-10-CM | POA: Diagnosis not present

## 2017-10-12 DIAGNOSIS — R198 Other specified symptoms and signs involving the digestive system and abdomen: Secondary | ICD-10-CM | POA: Insufficient documentation

## 2017-10-12 DIAGNOSIS — E109 Type 1 diabetes mellitus without complications: Secondary | ICD-10-CM | POA: Diagnosis not present

## 2017-10-12 DIAGNOSIS — Z881 Allergy status to other antibiotic agents status: Secondary | ICD-10-CM | POA: Diagnosis not present

## 2017-10-12 DIAGNOSIS — R197 Diarrhea, unspecified: Secondary | ICD-10-CM | POA: Insufficient documentation

## 2017-10-12 DIAGNOSIS — R194 Change in bowel habit: Secondary | ICD-10-CM | POA: Insufficient documentation

## 2017-10-12 DIAGNOSIS — Z79899 Other long term (current) drug therapy: Secondary | ICD-10-CM | POA: Insufficient documentation

## 2017-10-12 DIAGNOSIS — Z794 Long term (current) use of insulin: Secondary | ICD-10-CM | POA: Insufficient documentation

## 2017-10-12 DIAGNOSIS — Z8614 Personal history of Methicillin resistant Staphylococcus aureus infection: Secondary | ICD-10-CM | POA: Insufficient documentation

## 2017-10-12 HISTORY — PX: COLONOSCOPY WITH PROPOFOL: SHX5780

## 2017-10-12 SURGERY — COLONOSCOPY WITH PROPOFOL
Anesthesia: General

## 2017-10-12 MED ORDER — LIDOCAINE HCL (PF) 2 % IJ SOLN
INTRAMUSCULAR | Status: AC
  Filled 2017-10-12: qty 10

## 2017-10-12 MED ORDER — PROPOFOL 500 MG/50ML IV EMUL
INTRAVENOUS | Status: AC
  Filled 2017-10-12: qty 50

## 2017-10-12 MED ORDER — SODIUM CHLORIDE 0.9 % IV SOLN
INTRAVENOUS | Status: DC
  Administered 2017-10-12: 1000 mL via INTRAVENOUS

## 2017-10-12 MED ORDER — PROPOFOL 10 MG/ML IV BOLUS
INTRAVENOUS | Status: DC | PRN
  Administered 2017-10-12: 100 mg via INTRAVENOUS
  Administered 2017-10-12: 20 mg via INTRAVENOUS

## 2017-10-12 MED ORDER — LIDOCAINE HCL (CARDIAC) PF 100 MG/5ML IV SOSY
PREFILLED_SYRINGE | INTRAVENOUS | Status: DC | PRN
  Administered 2017-10-12: 45 mg via INTRAVENOUS

## 2017-10-12 MED ORDER — PROPOFOL 500 MG/50ML IV EMUL
INTRAVENOUS | Status: DC | PRN
  Administered 2017-10-12: 150 ug/kg/min via INTRAVENOUS

## 2017-10-12 NOTE — Anesthesia Preprocedure Evaluation (Addendum)
Anesthesia Evaluation  Patient identified by MRN, date of birth, ID band Patient awake    Reviewed: Allergy & Precautions, H&P , NPO status , Patient's Chart, lab work & pertinent test results  Airway Mallampati: III       Dental   Pulmonary neg pulmonary ROS,           Cardiovascular negative cardio ROS       Neuro/Psych negative neurological ROS  negative psych ROS   GI/Hepatic negative GI ROS, Neg liver ROS,   Endo/Other  negative endocrine ROSdiabetes, Type 1  Renal/GU negative Renal ROS  negative genitourinary   Musculoskeletal   Abdominal   Peds  Hematology negative hematology ROS (+)   Anesthesia Other Findings Past Medical History: No date: Diabetes mellitus without complication ([place]) No date: Insulin pump in place 3/16: MRSA (methicillin resistant Staphylococcus aureus)  Past Surgical History: No date: EYE SURGERY 07/20/2014: INCISION AND DRAINAGE PERIRECTAL ABSCESS; N/A     Comment:  Procedure: IRRIGATION AND DEBRIDEMENT PERINEAL ABSCESS;               Surgeon: Marlyce Huge, MD;  Location: [facility] ORS;               Service: General;  Laterality: N/A;  BMI    Body Mass Index:  28.73 kg/m      Reproductive/Obstetrics negative OB ROS                            Anesthesia Physical Anesthesia Plan  ASA: II  Anesthesia Plan: General   Post-op Pain Management:    Induction:   PONV Risk Score and Plan: Propofol infusion and TIVA  Airway Management Planned:   Additional Equipment:   Intra-op Plan:   Post-operative Plan:   Informed Consent: I have reviewed the patients History and Physical, chart, labs and discussed the procedure including the risks, benefits and alternatives for the proposed anesthesia with the patient or authorized representative who has indicated his/her understanding and acceptance.   Dental Advisory Given  Plan Discussed with:  Anesthesiologist, CRNA and Surgeon  Anesthesia Plan Comments:        Anesthesia Quick Evaluation

## 2017-10-12 NOTE — Op Note (Signed)
[facility] Patient Name: Nicholas Marsh Procedure Date: 10/12/2017 11:14 AM MRN: [id number] Account #: [id number] Date of Birth: [birth year] Admit Type: Outpatient Age: 47 Room: [facility] ENDO ROOM 1 Gender: Male Note Status: Finalized Procedure:            Colonoscopy Indications:          Clinically significant diarrhea of unexplained origin,                        Hematochezia, Change in bowel habits, Change in stool                        caliber Providers:            Benay Pike. Alice Reichert MD, MD Referring MD:         Youlanda Roys. Lovie Macadamia, MD (Referring MD) Medicines:            Propofol per Anesthesia Complications:        No immediate complications. Procedure:            Pre-Anesthesia Assessment:                       - The risks and benefits of the procedure and the                        sedation options and risks were discussed with the                        patient. All questions were answered and informed                        consent was obtained.                       - Patient identification and proposed procedure were                        verified prior to the procedure by the nurse. The                        procedure was verified in the procedure room.                       - ASA Grade Assessment: II - A patient with mild                        systemic disease.                       - After reviewing the risks and benefits, the patient                        was deemed in satisfactory condition to undergo the                        procedure.                       After obtaining informed consent, the colonoscope was  passed under direct vision. Throughout the procedure,                        the patient's blood pressure, pulse, and oxygen                        saturations were monitored continuously. The                        Colonoscope was introduced through the anus and                        advanced to the  the cecum, identified by appendiceal                        orifice and ileocecal valve. The colonoscopy was                        performed without difficulty. The patient tolerated the                        procedure well. The quality of the bowel preparation                        was good. The ileocecal valve, appendiceal orifice, and                        rectum were photographed. Findings:      The digital rectal exam findings include palpable rectal mass. Pertinent       negatives include normal sphincter tone.      A 8 mm polyp was found in the cecum. The polyp was semi-pedunculated.       The polyp was removed with a hot snare. Resection and retrieval were       complete.      An infiltrative, sessile and ulcerated non-obstructing medium-sized mass       was found in the distal rectum. The mass was non-circumferential. The       mass measured four cm in length. In addition, its diameter measured       three mm. No bleeding was present. This was biopsied with a cold       large-capacity forceps for histology.      The exam was otherwise without abnormality. Impression:           - Palpable rectal mass found on digital rectal exam.                       - One 8 mm polyp in the cecum, removed with a hot                        snare. Resected and retrieved.                       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 Biopsied.                       - The examination was otherwise normal. Recommendation:       - Patient has a contact number available for  emergencies. The signs and symptoms of potential                        delayed complications were discussed with the patient.                        Return to normal activities tomorrow. Written discharge                        instructions were provided to the patient.                       - Resume previous diet.                       - Continue present medications.                       - Await  pathology results.                       - Refer to a colo-rectal surgeon at the next available                        appointment.                       - The findings and recommendations were discussed with                        the patient and their family. Procedure Code(s):    --- Professional ---                       [phone], Colonoscopy, flexible; with removal of tumor(s),                        polyp(s), or other lesion(s) by snare technique                       [id number], 59, Colonoscopy, flexible; with biopsy, single                        or multiple Diagnosis Code(s):    --- Professional ---                       R19.5, Other fecal abnormalities                       R19.4, Change in bowel habit                       K92.1, Melena (includes Hematochezia)                       R19.7, Diarrhea, unspecified                       D49.0, Neoplasm of unspecified behavior of digestive                        system                       D12.0, Benign neoplasm  of cecum                       K62.89, Other specified diseases of anus and rectum CPT copyright 2017 [employer]. All rights reserved. The codes documented in this report are preliminary and upon coder review may  be revised to meet current compliance requirements. Efrain Sella MD, MD 10/12/2017 11:59:15 AM This report has been signed electronically. Number of Addenda: 0 Note Initiated On: 10/12/2017 11:14 AM Scope Withdrawal Time: 0 hours 14 minutes 27 seconds  Total Procedure Duration: 0 hours 19 minutes 6 seconds       [facility]

## 2017-10-12 NOTE — H&P (Signed)
Outpatient short stay form Pre-procedure 10/12/2017 11:14 AM Nicholas Marsh K. Nicholas Marsh, M.D.  Primary Physician: Juluis Pitch, M.D.  Reason for visit:  Diarrhea, hematochezia  History of present illness: 47 year old male with a history of type 1 diabetes mellitus and anxiety presents for 6 to 8 months of diarrhea, mucus in the stools, fecal urgency and tenesmus.  There is blood in the stool.  There is a subjective 15 pound weight loss over the last 7 or 8 months.  Negative family history for colon cancer or inflammatory bowel disease.    Current Facility-Administered Medications:  .  0.9 %  sodium chloride infusion, , Intravenous, Continuous, [place], Benay Pike, MD, Last Rate: 20 mL/hr at 10/12/17 1054, 1,000 mL at 10/12/17 1054  Medications Prior to Admission  Medication Sig Dispense Refill Last Dose  . BAYER CONTOUR NEXT TEST test strip USE 1 STRIP 6 TIMES A DAY AS INSTRUCTED  9   . citalopram (CELEXA) 40 MG tablet Take 40 mg by mouth daily.   07/18/2014 at Unknown time  . insulin aspart (NOVOLOG) 100 UNIT/ML injection Inject into the skin.     . Insulin Human (INSULIN PUMP) SOLN 1 each by Other route continuous. Pt uses Novolog.   07/19/2014 at Unknown time  . losartan (COZAAR) 50 MG tablet Take 50 mg by mouth daily.   07/18/2014 at Unknown time  . simvastatin (ZOCOR) 40 MG tablet Take 40 mg by mouth daily.   07/18/2014 at Unknown time     Allergies  Allergen Reactions  . Ciprofloxacin Other (See Comments)    Reaction:  Unknown   . Shellfish Allergy Hives     Past Medical History:  Diagnosis Date  . Diabetes mellitus without complication ([place])   . Insulin pump in place   . MRSA (methicillin resistant Staphylococcus aureus) 3/16    Review of systems:  Otherwise negative.    Physical Exam  Gen: Alert, oriented. Appears stated age.  HEENT: [place]/AT. PERRLA. Lungs: CTA, no wheezes. CV: RR nl S1, S2. Abd: soft, benign, no masses. BS+ Ext: No edema. Pulses 2+    Planned procedures:  Proceed with colonoscopy. The patient understands the nature of the planned procedure, indications, risks, alternatives and potential complications including but not limited to bleeding, infection, perforation, damage to internal organs and possible oversedation/side effects from anesthesia. The patient agrees and gives consent to proceed.  Please refer to procedure notes for findings, recommendations and patient disposition/instructions.     [name] K. Nicholas Marsh, M.D. Gastroenterology 10/12/2017  11:14 AM

## 2017-10-12 NOTE — Anesthesia Post-op Follow-up Note (Signed)
Anesthesia QCDR form completed.        

## 2017-10-12 NOTE — Interval H&P Note (Signed)
History and Physical Interval Note:  10/12/2017 11:15 AM  Nicholas Marsh  has presented today for surgery, with the diagnosis of Rectal bleeding  The various methods of treatment have been discussed with the patient and family. After consideration of risks, benefits and other options for treatment, the patient has consented to  Procedure(s): COLONOSCOPY WITH PROPOFOL (N/A) as a surgical intervention .  The patient's history has been reviewed, patient examined, no change in status, stable for surgery.  I have reviewed the patient's chart and labs.  Questions were answered to the patient's satisfaction.     [place], [place]

## 2017-10-12 NOTE — Transfer of Care (Signed)
Immediate Anesthesia Transfer of Care Note  Patient: Nicholas Marsh  Procedure(s) Performed: COLONOSCOPY WITH PROPOFOL (N/A )  Patient Location: PACU and Endoscopy Unit  Anesthesia Type:General  Level of Consciousness: awake  Airway & Oxygen Therapy: Patient Spontanous Breathing  Post-op Assessment: Report given to RN  Post vital signs: stable  Last Vitals:  Vitals Value Taken Time  BP 109/58 10/12/2017 12:01 PM  Temp 36 C 10/12/2017 11:59 AM  Pulse 59 10/12/2017 12:01 PM  Resp 0 10/12/2017 12:01 PM  SpO2 100 % 10/12/2017 12:01 PM  Vitals shown include unvalidated device data.  Last Pain:  Vitals:   10/12/17 1159  TempSrc: Tympanic  PainSc:          Complications: No apparent anesthesia complications

## 2017-10-13 ENCOUNTER — Other Ambulatory Visit: Payer: Self-pay | Admitting: Family Medicine

## 2017-10-13 ENCOUNTER — Encounter: Payer: Self-pay | Admitting: Internal Medicine

## 2017-10-13 LAB — SURGICAL PATHOLOGY

## 2017-10-13 NOTE — Anesthesia Postprocedure Evaluation (Signed)
Anesthesia Post Note  Patient: Nicholas Marsh  Procedure(s) Performed: COLONOSCOPY WITH PROPOFOL (N/A )  Patient location during evaluation: PACU Anesthesia Type: General Level of consciousness: awake and alert Pain management: pain level controlled Vital Signs Assessment: post-procedure vital signs reviewed and stable Respiratory status: spontaneous breathing, nonlabored ventilation and respiratory function stable Cardiovascular status: blood pressure returned to baseline and stable Postop Assessment: no apparent nausea or vomiting Anesthetic complications: no     Last Vitals:  Vitals:   10/12/17 1209 10/12/17 1210  BP: 112/60 (!) 121/91  Pulse: 63 (!) 58  Resp: 12 16  Temp:    SpO2: 100% 99%    Last Pain:  Vitals:   10/12/17 1210  TempSrc:   PainSc: 0-No pain                 Durenda Hurt

## 2017-10-18 ENCOUNTER — Ambulatory Visit: Admit: 2017-10-18 | Discharge: 2017-10-19 | Payer: PRIVATE HEALTH INSURANCE

## 2017-10-18 DIAGNOSIS — C20 Malignant neoplasm of rectum: Principal | ICD-10-CM

## 2017-10-25 ENCOUNTER — Encounter: Admit: 2017-10-25 | Discharge: 2017-10-25 | Payer: PRIVATE HEALTH INSURANCE | Attending: Surgery | Primary: Surgery

## 2017-10-25 ENCOUNTER — Ambulatory Visit: Admit: 2017-10-25 | Discharge: 2017-10-26 | Payer: PRIVATE HEALTH INSURANCE | Attending: Surgery | Primary: Surgery

## 2017-10-25 DIAGNOSIS — K629 Disease of anus and rectum, unspecified: Principal | ICD-10-CM

## 2017-10-25 DIAGNOSIS — K6289 Other specified diseases of anus and rectum: Principal | ICD-10-CM

## 2017-11-04 ENCOUNTER — Ambulatory Visit: Admit: 2017-11-04 | Discharge: 2017-11-05 | Payer: PRIVATE HEALTH INSURANCE

## 2017-11-04 DIAGNOSIS — C787 Secondary malignant neoplasm of liver and intrahepatic bile duct: Secondary | ICD-10-CM

## 2017-11-04 DIAGNOSIS — C20 Malignant neoplasm of rectum: Principal | ICD-10-CM

## 2017-11-10 ENCOUNTER — Ambulatory Visit
Admit: 2017-11-10 | Discharge: 2017-12-08 | Payer: PRIVATE HEALTH INSURANCE | Attending: Radiation Oncology | Primary: Radiation Oncology

## 2017-11-10 ENCOUNTER — Ambulatory Visit
Admit: 2017-11-10 | Discharge: 2017-11-11 | Payer: PRIVATE HEALTH INSURANCE | Attending: Hematology & Oncology | Primary: Hematology & Oncology

## 2017-11-10 DIAGNOSIS — C20 Malignant neoplasm of rectum: Principal | ICD-10-CM

## 2017-11-10 DIAGNOSIS — C787 Secondary malignant neoplasm of liver and intrahepatic bile duct: Secondary | ICD-10-CM

## 2017-11-14 ENCOUNTER — Ambulatory Visit: Admit: 2017-11-14 | Discharge: 2017-11-14 | Payer: PRIVATE HEALTH INSURANCE

## 2017-11-14 DIAGNOSIS — C20 Malignant neoplasm of rectum: Principal | ICD-10-CM

## 2017-11-14 DIAGNOSIS — C787 Secondary malignant neoplasm of liver and intrahepatic bile duct: Secondary | ICD-10-CM

## 2017-11-14 MED ORDER — PROCHLORPERAZINE MALEATE 10 MG TABLET
ORAL_TABLET | Freq: Four times a day (QID) | ORAL | prn refills | 0 days | Status: CP | PRN
Start: 2017-11-14 — End: ?

## 2017-11-14 MED ORDER — ONDANSETRON HCL 8 MG TABLET
ORAL_TABLET | Freq: Three times a day (TID) | ORAL | prn refills | 0 days | Status: CP | PRN
Start: 2017-11-14 — End: ?

## 2017-11-17 ENCOUNTER — Ambulatory Visit
Admit: 2017-11-17 | Discharge: 2017-11-18 | Payer: PRIVATE HEALTH INSURANCE | Attending: Hematology & Oncology | Primary: Hematology & Oncology

## 2017-11-17 ENCOUNTER — Ambulatory Visit: Admit: 2017-11-17 | Discharge: 2017-11-18 | Payer: PRIVATE HEALTH INSURANCE

## 2017-11-17 DIAGNOSIS — C20 Malignant neoplasm of rectum: Principal | ICD-10-CM

## 2017-11-21 ENCOUNTER — Ambulatory Visit: Admit: 2017-11-21 | Discharge: 2017-11-22 | Payer: PRIVATE HEALTH INSURANCE

## 2017-11-21 DIAGNOSIS — C20 Malignant neoplasm of rectum: Principal | ICD-10-CM

## 2017-11-21 MED ORDER — FLUOROURACIL (ADRUCIL) IVPB
INTRAVENOUS | 0 refills | 0.00000 days
Start: 2017-11-21 — End: 2018-01-26

## 2017-12-01 ENCOUNTER — Ambulatory Visit
Admit: 2017-12-01 | Discharge: 2017-12-02 | Payer: PRIVATE HEALTH INSURANCE | Attending: Hematology & Oncology | Primary: Hematology & Oncology

## 2017-12-01 DIAGNOSIS — C20 Malignant neoplasm of rectum: Principal | ICD-10-CM

## 2017-12-01 DIAGNOSIS — C787 Secondary malignant neoplasm of liver and intrahepatic bile duct: Secondary | ICD-10-CM

## 2017-12-05 ENCOUNTER — Ambulatory Visit: Admit: 2017-12-05 | Discharge: 2017-12-06 | Payer: PRIVATE HEALTH INSURANCE

## 2017-12-05 ENCOUNTER — Ambulatory Visit
Admit: 2017-12-05 | Discharge: 2017-12-06 | Disposition: A | Discharge status: Home / Self Care | Payer: PRIVATE HEALTH INSURANCE | Attending: Family

## 2017-12-05 DIAGNOSIS — C20 Malignant neoplasm of rectum: Principal | ICD-10-CM

## 2017-12-05 MED ORDER — FLUOROURACIL (ADRUCIL) IVPB
INTRAVENOUS | 0 refills | 0.00000 days
Start: 2017-12-05 — End: 2018-01-26

## 2017-12-12 ENCOUNTER — Ambulatory Visit
Admit: 2017-12-12 | Discharge: 2017-12-13 | Payer: PRIVATE HEALTH INSURANCE | Attending: Surgical Oncology | Primary: Surgical Oncology

## 2017-12-12 DIAGNOSIS — C20 Malignant neoplasm of rectum: Principal | ICD-10-CM

## 2017-12-12 DIAGNOSIS — C787 Secondary malignant neoplasm of liver and intrahepatic bile duct: Secondary | ICD-10-CM

## 2017-12-19 ENCOUNTER — Ambulatory Visit: Admit: 2017-12-19 | Discharge: 2017-12-19 | Payer: PRIVATE HEALTH INSURANCE

## 2017-12-19 DIAGNOSIS — C20 Malignant neoplasm of rectum: Principal | ICD-10-CM

## 2017-12-26 ENCOUNTER — Ambulatory Visit: Admit: 2017-12-26 | Discharge: 2017-12-27 | Payer: PRIVATE HEALTH INSURANCE

## 2017-12-26 DIAGNOSIS — C20 Malignant neoplasm of rectum: Principal | ICD-10-CM

## 2017-12-30 MED ORDER — PEGFILGRASTIM-CBQV 6 MG/0.6 ML SUBCUTANEOUS SYRINGE
Freq: Once | SUBCUTANEOUS | 0 refills | 0.00000 days | Status: CP
Start: 2017-12-30 — End: 2018-02-10

## 2018-01-02 ENCOUNTER — Ambulatory Visit: Admit: 2018-01-02 | Discharge: 2018-01-03 | Payer: PRIVATE HEALTH INSURANCE

## 2018-01-02 DIAGNOSIS — C20 Malignant neoplasm of rectum: Principal | ICD-10-CM

## 2018-01-02 MED ORDER — PEGFILGRASTIM-CBQV 6 MG/0.6 ML SUBCUTANEOUS SYRINGE
Freq: Once | SUBCUTANEOUS | 0 refills | 0.00000 days | Status: CP
Start: 2018-01-02 — End: 2018-01-02

## 2018-01-02 MED ORDER — PEGFILGRASTIM-CBQV 6 MG/0.6 ML SUBCUTANEOUS SYRINGE: 6 mg | mL | Freq: Once | 0 refills | 0 days | Status: AC

## 2018-01-02 MED ORDER — FLUOROURACIL (ADRUCIL) IVPB
INTRAVENOUS | 0 refills | 0.00000 days
Start: 2018-01-02 — End: 2018-01-26

## 2018-01-16 ENCOUNTER — Ambulatory Visit: Admit: 2018-01-16 | Discharge: 2018-01-16 | Payer: PRIVATE HEALTH INSURANCE

## 2018-01-16 DIAGNOSIS — C20 Malignant neoplasm of rectum: Principal | ICD-10-CM

## 2018-01-16 MED ORDER — FLUOROURACIL (ADRUCIL) IVPB
INTRAVENOUS | 0 refills | 0 days
Start: 2018-01-16 — End: ?

## 2018-01-26 ENCOUNTER — Ambulatory Visit
Admit: 2018-01-26 | Discharge: 2018-01-27 | Payer: PRIVATE HEALTH INSURANCE | Attending: Hematology & Oncology | Primary: Hematology & Oncology

## 2018-01-26 DIAGNOSIS — C20 Malignant neoplasm of rectum: Principal | ICD-10-CM

## 2018-01-26 DIAGNOSIS — C787 Secondary malignant neoplasm of liver and intrahepatic bile duct: Secondary | ICD-10-CM

## 2018-01-31 ENCOUNTER — Other Ambulatory Visit: Admit: 2018-01-31 | Discharge: 2018-02-01 | Payer: PRIVATE HEALTH INSURANCE

## 2018-01-31 ENCOUNTER — Ambulatory Visit: Admit: 2018-01-31 | Discharge: 2018-02-01 | Payer: PRIVATE HEALTH INSURANCE

## 2018-01-31 DIAGNOSIS — C20 Malignant neoplasm of rectum: Principal | ICD-10-CM

## 2018-02-06 ENCOUNTER — Ambulatory Visit: Admit: 2018-02-06 | Discharge: 2018-02-07 | Payer: PRIVATE HEALTH INSURANCE

## 2018-02-06 DIAGNOSIS — C20 Malignant neoplasm of rectum: Principal | ICD-10-CM

## 2018-02-10 MED ORDER — PEGFILGRASTIM-CBQV 6 MG/0.6 ML SUBCUTANEOUS SYRINGE
SUBCUTANEOUS | 3 refills | 0 days | Status: CP
Start: 2018-02-10 — End: ?

## 2018-02-20 ENCOUNTER — Ambulatory Visit: Admit: 2018-02-20 | Discharge: 2018-02-21 | Payer: PRIVATE HEALTH INSURANCE

## 2018-02-20 DIAGNOSIS — C20 Malignant neoplasm of rectum: Principal | ICD-10-CM

## 2018-02-23 ENCOUNTER — Ambulatory Visit: Admit: 2018-02-23 | Discharge: 2018-02-24 | Payer: PRIVATE HEALTH INSURANCE

## 2018-02-23 DIAGNOSIS — E109 Type 1 diabetes mellitus without complications: Secondary | ICD-10-CM

## 2018-02-23 DIAGNOSIS — C20 Malignant neoplasm of rectum: Principal | ICD-10-CM

## 2018-02-23 DIAGNOSIS — D701 Agranulocytosis secondary to cancer chemotherapy: Secondary | ICD-10-CM

## 2018-02-23 DIAGNOSIS — C787 Secondary malignant neoplasm of liver and intrahepatic bile duct: Secondary | ICD-10-CM

## 2018-02-23 DIAGNOSIS — T451X5A Adverse effect of antineoplastic and immunosuppressive drugs, initial encounter: Secondary | ICD-10-CM

## 2018-03-06 ENCOUNTER — Ambulatory Visit: Admit: 2018-03-06 | Discharge: 2018-03-07 | Payer: PRIVATE HEALTH INSURANCE

## 2018-03-06 DIAGNOSIS — C20 Malignant neoplasm of rectum: Principal | ICD-10-CM

## 2018-03-20 ENCOUNTER — Ambulatory Visit: Admit: 2018-03-20 | Discharge: 2018-03-21 | Payer: PRIVATE HEALTH INSURANCE

## 2018-03-20 DIAGNOSIS — C20 Malignant neoplasm of rectum: Principal | ICD-10-CM

## 2018-03-20 LAB — CBC W/ AUTO DIFF
BASOPHILS ABSOLUTE COUNT: 0 10*9/L (ref 0.0–0.1)
BASOPHILS RELATIVE PERCENT: 1.2 %
EOSINOPHILS ABSOLUTE COUNT: 0.1 10*9/L (ref 0.0–0.4)
EOSINOPHILS RELATIVE PERCENT: 2.2 %
HEMATOCRIT: 39.6 % — ABNORMAL LOW (ref 41.0–53.0)
HEMOGLOBIN: 13.7 g/dL (ref 13.5–17.5)
LYMPHOCYTES RELATIVE PERCENT: 42.2 %
MEAN CORPUSCULAR HEMOGLOBIN CONC: 34.6 g/dL (ref 31.0–37.0)
MEAN CORPUSCULAR HEMOGLOBIN: 31.6 pg (ref 26.0–34.0)
MEAN CORPUSCULAR VOLUME: 91.5 fL (ref 80.0–100.0)
MEAN PLATELET VOLUME: 11.5 fL — ABNORMAL HIGH (ref 7.0–10.0)
MONOCYTES ABSOLUTE COUNT: 0.4 10*9/L (ref 0.2–0.8)
MONOCYTES RELATIVE PERCENT: 13 %
NEUTROPHILS RELATIVE PERCENT: 41.4 %
PLATELET COUNT: 118 10*9/L — ABNORMAL LOW (ref 150–440)
RED BLOOD CELL COUNT: 4.33 10*12/L — ABNORMAL LOW (ref 4.50–5.90)
RED CELL DISTRIBUTION WIDTH: 14.4 % (ref 12.0–15.0)
WBC ADJUSTED: 3.2 10*9/L — ABNORMAL LOW (ref 4.5–11.0)

## 2018-03-20 LAB — CREATININE: EGFR CKD-EPI NON-AA MALE: >90 mL/min/{1.73_m2} (ref >=60)

## 2018-03-20 LAB — POTASSIUM: Potassium:SCnc:Pt:Ser/Plas:Qn:: 4.2

## 2018-03-20 LAB — EGFR CKD-EPI NON-AA MALE: Lab: >90

## 2018-03-20 LAB — MAGNESIUM: Magnesium:MCnc:Pt:Ser/Plas:Qn:: 2

## 2018-03-20 LAB — MEAN CORPUSCULAR VOLUME: Lab: 91.5

## 2018-03-20 MED ORDER — CAPECITABINE 500 MG TABLET
ORAL_TABLET | Freq: Two times a day (BID) | ORAL | 0 refills | 0.00000 days | Status: CP
Start: 2018-03-20 — End: ?
  Filled 2018-04-03: qty 120, 20d supply, fill #0

## 2018-03-20 MED ORDER — FLUOROURACIL (ADRUCIL) IVPB
INTRAVENOUS | 0 refills | 0.00000 days
Start: 2018-03-20 — End: ?

## 2018-03-20 NOTE — Unmapped (Signed)
Infusion on 03/20/2018   Component Date Value Ref Range Status   ??? Potassium 03/20/2018 4.2  3.5 - 5.0 mmol/L Final   ??? Creatinine 03/20/2018 0.59* 0.70 - 1.30 mg/dL Final   ??? EGFR CKD-EPI Non-African American,* 03/20/2018 >90  >=60 mL/min/1.20m2 Final   ??? EGFR CKD-EPI African American, Male 03/20/2018 >90  >=60 mL/min/1.32m2 Final   ??? Magnesium 03/20/2018 2.0  1.6 - 2.2 mg/dL Final   ??? WBC 16/11/9602 3.2* 4.5 - 11.0 10*9/L Final   ??? RBC 03/20/2018 4.33* 4.50 - 5.90 10*12/L Final   ??? HGB 03/20/2018 13.7  13.5 - 17.5 g/dL Final   ??? HCT 54/10/8117 39.6* 41.0 - 53.0 % Final   ??? MCV 03/20/2018 91.5  80.0 - 100.0 fL Final   ??? MCH 03/20/2018 31.6  26.0 - 34.0 pg Final   ??? MCHC 03/20/2018 34.6  31.0 - 37.0 g/dL Final   ??? RDW 14/78/2956 14.4  12.0 - 15.0 % Final   ??? MPV 03/20/2018 11.5* 7.0 - 10.0 fL Final   ??? Platelet 03/20/2018 118* 150 - 440 10*9/L Final   ??? Neutrophils % 03/20/2018 41.4  % Final   ??? Lymphocytes % 03/20/2018 42.2  % Final   ??? Monocytes % 03/20/2018 13.0  % Final   ??? Eosinophils % 03/20/2018 2.2  % Final   ??? Basophils % 03/20/2018 1.2  % Final   ??? Absolute Neutrophils 03/20/2018 1.3* 2.0 - 7.5 10*9/L Final   ??? Absolute Lymphocytes 03/20/2018 1.4* 1.5 - 5.0 10*9/L Final   ??? Absolute Monocytes 03/20/2018 0.4  0.2 - 0.8 10*9/L Final   ??? Absolute Eosinophils 03/20/2018 0.1  0.0 - 0.4 10*9/L Final   ??? Absolute Basophils 03/20/2018 0.0  0.0 - 0.1 10*9/L Final       If you feel like this is an emergency please call 911.  For appointments or questions Monday through Friday 8AM-5PM please call [phone] or Toll Free [phone]. For Medical questions or concerns ask for the Nurse Triage Line.  On Nights, Weekends, and Holidays call [phone] and ask for the Oncologist on Call.  Reasons to call the Nurse Triage Line:  Fever of 100.5 or greater  Nausea and/or vomiting not relived with nausea medicine  Diarrhea or constipation  Severe pain not relieved with usual pain regimen  Shortness of breath Uncontrolled bleeding  Mental status changes

## 2018-03-20 NOTE — Unmapped (Signed)
Discharged from infusion area ambulatory with no complications noted post chemo. Home pump attached and running per [employer].

## 2018-03-20 NOTE — Unmapped (Signed)
He is scheduled to return for discussion of radiation and simulation. Will get capecitabine prescription submitted for authorization.

## 2018-03-20 NOTE — Unmapped (Signed)
Per test claim for CAPECITABINE 500MG  TABLET at the [facility], patient needs Medication Assistance Program for Prior Authorization.

## 2018-03-22 NOTE — Unmapped (Signed)
[facility] Specialty Medication Referral: PA Approved      Medication (Brand/Generic): Capecitabine    Final Test Claim completed with resulted information below:    Patient ABLE to fill at [facility]  Insurance Company:  [employer]  Anticipated Copay: $100  Is anticipated copay with a copay card or grant? No    Co-Pay Card Assistance: No co-pay card option available   [facility] Assistance: Ineligible per Field seismologist Program: Ineligible per grant regulations      Does patient's insurance plan only allow a 15 day supply for the first 6 fills in the [place] Program? No  If yes, inform patient they can request to dis-enroll from the [facility] by calling the patient help desk at N/A.      If the copay is under the $25 defined limit, per policy there will be no further investigation of need for financial assistance at this time unless patient requests. This referral has been communicated to the provider and handed off to the [facility] team for further processing and filling of prescribed medication.   ______________________________________________________________________  Please utilize this referral for viewing purposes as it will serve as the central location for all relevant documentation and updates.

## 2018-03-24 NOTE — Unmapped (Signed)
Left a message to call me about coordinating his specialty medicine.  Will try again first thing Monday if necessary.      Elaina Pattee, PharmD, BCOP, CPP

## 2018-03-27 NOTE — Unmapped (Signed)
Left message for Max Stewart to call me regarding his medication coordination.

## 2018-03-27 NOTE — Unmapped (Signed)
No answer

## 2018-03-30 NOTE — Unmapped (Signed)
No answer.  Need to provided counseling/coordination of capecitabine.  Will notify team of trouble contacting Mr. [name].      Elaina Pattee, PharmD, BCOP, CPP

## 2018-04-03 MED FILL — CAPECITABINE 500 MG TABLET: 20 days supply | Qty: 120 | Fill #0 | Status: AC

## 2018-04-03 NOTE — Unmapped (Signed)
[facility]) Pharmacy Onboarding    Max Stewart is a 48 y.o. male who was counseled on initiation of capecitabine. Patient's date of birth/HIPAA verified.    Diagnosis: colon cancer    Related to appropriateness of therapy, the following patient information was reviewed: diagnosis, contraindications, precautions, comorbidities, and allergies. Patient will receive a Lexi-Comp drug information handout with shipment.    Patient Specific Needs     ?? Patient speaks [place], an interpreter is not needed.  ?? Patient is able to read and understand education materials at a high school level or above.  ?? Patient does not have any cultural barriers.  ?? Patient has no cognitive or physical impairments.    Medication Acquisition     Anticipated copay discussed with patient: $100    MAP Involvement:  ?? Prior authorization: yes  ?? Copay assistance: no  ?? Grant assistance: no    SSC and Delivery Information     Patient will receive a medication information handout and a Herbalist with their shipment.    Patient informed that shipment will be delivered as soon as possible February 25 or 26th via UPS and patient will not have to sign for the package.    Delivery address verified:  [address]  [place], [place] [address]    Reviewed the services that the [facility] will provide and how to contact them [phone] option 4).  A representative from the pharmacy will contact the patient to remind them to set up their refill 7-10 days prior to the patient running out of medication. Emphasized the importance of answering or returning phone calls from the [facility] to prevent delays in therapy.  The pharmacy MUST speak to the patient to schedule the refill.    Patient informed that a pharmacist is available Monday through Friday 8:30 am to 4:30 pm. A pharmacist is available via pager 24/7 to answer any clinical questions regarding the Medication.    Medication Education     Please see progress note from 04/03/2018 for medication-specific counseling session.    Required counseling elements:  ?? Dosing/administration  ?? Missed dose  ?? Side effects/precautions  ?? Storage, handling, and disposal  ?? Drug interactions/medication reconciliation  ?? Adherence    Dory Peru, CPP  Clinical Pharmacist Practitioner

## 2018-04-03 NOTE — Unmapped (Signed)
Pharmacy: First Cycle Chemotherapy Patient Education    Chemotherapy regimen/agents: capecitabine  Day 1 of chemotherapy: to be determined.  Radiation appointment 04/04/18    I counseled Max Stewart about his new chemotherapy regimen.  Patient has been on infusional 5-fluorouracil.      ?? Dosing/administration:  Discussed twice daily dosing during radiation M-Friday only  ?? Missed dose:  Don't double up  ?? Side effects/precautions:  Reviewed side effects below and compared to previous FOLFOX  ?? Storage, handling, and disposal  ?? Drug interactions/medication reconciliation:  No interactions seen.    ?? Adherence:  Important to try to take at same time everyday    Counseling Points:     Handout(s) provided from:  [email or web address].    Adverse effects discussed included the following:      Neutropenia/Febrile Neutropenia  Thrombocytopenia  Anemia  Nausea/Vomiting  Diarrhea  Mucositis  Nail and Skin Changes  Palmar/Plantar Erythrodysthesia (Hand/foot syndrome and skin reaction)    Approximate time counseling patient/coordinating specialty medication: 20 mins.      Thank you, please feel free to page with any questions or concerns.  Gardner Candle, PharmD, BCOP, CPP

## 2018-04-04 ENCOUNTER — Ambulatory Visit
Admit: 2018-04-04 | Discharge: 2018-04-08 | Payer: PRIVATE HEALTH INSURANCE | Attending: Radiation Oncology | Primary: Radiation Oncology

## 2018-04-04 ENCOUNTER — Ambulatory Visit: Admit: 2018-04-04 | Discharge: 2018-04-08 | Payer: PRIVATE HEALTH INSURANCE

## 2018-04-04 DIAGNOSIS — C787 Secondary malignant neoplasm of liver and intrahepatic bile duct: Secondary | ICD-10-CM

## 2018-04-04 DIAGNOSIS — C20 Malignant neoplasm of rectum: Principal | ICD-10-CM

## 2018-04-04 NOTE — Unmapped (Signed)
Here to fu with dr Kathryne Eriksson for consent prior to sim for radiaiton treatment. I spent less than 5 mins educating pt and wife on side effects and radiation treatment process. Consent witnessed

## 2018-04-04 NOTE — Unmapped (Signed)
RADIATION ONCOLOGY SIMULATION NOTE     Patient Name: Max Stewart  Patient Age: 48 y.o.  Date of Encounter: 04/04/2018    SIMULATION:    Type:  Initial simulation of the pelvis    Contrast: None    The patient was taken to the CT simulation room and placed in a supine position with customized immobilization and/or position devices including wingboard.   Catheters/markers were placed on surface anatomy of interest including anal marker.  A CT images were obtained from the mid-abdomen to mid-thigh.  I approved the patient set up and reviewed the CT images; both are adequate. I tentatively plan to utilize multiple fields. The number and use of treatment devices will be determined at the time of computerized planning.    I have placed an isocenter/localization point in three dimensions on these images.  This was marked on the patient???s skin for subsequent radiation treatment set-up.  Additional details of the CT simulation are available in the departmental [facility] electronic medical record.  CT images were then transferred to the radiation treatment-planning computer for planning and dosimetry.

## 2018-04-04 NOTE — Unmapped (Signed)
RADIATION ONCOLOGY TREATMENT PLANNING NOTE     Patient Name: Max Stewart  Patient Age: 48 y.o.  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 Refer to the consult for full clinical details.    I plan to treat him/her with photons utilizing 3D CRT technique.     The radiation target area/treatment site will be the rectal mass and pelvic LN    I will attempt to minimize the dose to small bowel, femoral heads, bladder  The total radiation dose will be 5040 cGy at 180 cGy/fraction for a total of 28 fractions, treated once a day. Chemotherapy administered concurrently.    Treatment Intent: curative.    Technique Rationale:     3DCRT: 3D conformal therapy is necessary to increase dose conformity and to review DVH's and to review isodose distribution for the target and normal tissue structures listed above    Simulation Order: I requested radiation treatment planning CT scan to acquire information regarding patient's anatomy of the treatment site, radiation treatment target volumes, and adjacent normal organs. This is required to be done in patient's treatment position for radiation treatment planning and for patient's subsequent radiation treatment positioning and treatment setups.     Image Fusion:  None    Special Treatment Procedure: Treatment planning for [name] is more complex and requires more time because of concurrent chemotherapy necessitating more monitoring/management of acute toxicities.    Verification Simulation: I have ordered for the patient to come in for verification simulation on the treatment machine to ensure that information was transferred appropriately from the planning system to the treatment machine treatment and to verify patient setup, immobilization, and image guidance.    Image Guidance/Tracking: Weekly PORT films to verify agreement of treatment ports with original planned fields.     Rayetta Humphrey, MD  Assistant Professor  [facility] Dept of Radiation Oncology  04/04/2018      I was present for the sim    Nolon Rod MD

## 2018-04-05 ENCOUNTER — Ambulatory Visit
Admit: 2018-04-05 | Discharge: 2018-04-08 | Payer: PRIVATE HEALTH INSURANCE | Attending: Radiation Oncology | Primary: Radiation Oncology

## 2018-04-05 ENCOUNTER — Ambulatory Visit: Admit: 2018-04-05 | Discharge: 2018-04-08 | Payer: PRIVATE HEALTH INSURANCE

## 2018-04-05 DIAGNOSIS — C20 Malignant neoplasm of rectum: Secondary | ICD-10-CM

## 2018-04-05 DIAGNOSIS — Z510 Encounter for antineoplastic radiation therapy: Principal | ICD-10-CM

## 2018-04-05 NOTE — Unmapped (Signed)
RADIATION ONCOLOGY FOLLOW-UP VISIT NOTE     Encounter Date: 04/04/2018  Patient Name: Max Stewart  Medical Record Number: [id number]    DIAGNOSIS:  47yo with metastatic distal rectal cancer with imaging demonstrating 5 distinct lesions consistent with liver metastases.  He is now s/p neoadjuvant FOLFOX with a good response on imaging from today.    DURATION SINCE COMPLETION OF RADIOTHERAPY:  NA    ASSESSMENT:  Disease Status: Treatment response on imaging today    RECOMMENDATIONS:  We reviewed his imaging from earlier today- per my review the hepatic lesions are no longer visible and the primary tumor has certainly had a response as well- awaiting formal review from radiology.  We discussed that the next step in treatment is chemoradiation which will be followed by surgical resection.  We reviewed the logistics of chemoRT including daily treatment M-F for ~5 1/2 weeks.  We reviewed potential side effects including fatigue, bowel irritation (diarrhea, urgency), urinary irritation (frequency, urgency, dysuria), and rare late side effects including bowel obstruction, damage to femoral heads, secondary malignancy.    He has a radiation planning session after our visit today.  He is currently 2 weeks out from chemotherapy and we will plan to start RT ~2 weeks (4 weeks post-chemo).  We reviewed starting xeloda on the first day of RT.    Signed, witnessed consent was obtained.    INTERVAL HISTORY:      Since our meeting 11/2017 he has undergone neoadjuvant FOLFOX with what appears to be a good response clinically.  Symptomatically he also felt much better shortly after starting chemo.  After the first cycle his rectal discomfort and straining had improved significantly.  He otherwise tolerated chemo well.    Today he feels good and is ready to move forward with additional treatment.  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    REVIEW OF SYSTEMS:  A comprehensive review of 10 systems was negative except for pertinent positives noted in HPI.    PAST MEDICAL HISTORY/FAMILY HISTORY/SOCIAL HISTORY:  Reviewed in EPIC    ALLERGIES/MEDICATIONS:  Reviewed in EPIC    PHYSICAL EXAM:  Vital Signs for this encounter:   BP 114/59  - Pulse 63  - Temp 36.2 ??C (97.2 ??F) (Temporal)  - Wt 95.2 kg (209 lb 14.4 oz)  - SpO2 98%  - BMI 29.28 kg/m??   Karnofsky/Lansky Performance Status: 100, Fully active, able to carry on all pre-disease performed without restriction (ECOG equivalent 0)  General:   No acute distress, alert and oriented X 4   Head: Normocephalic, without obvious abnormality, atraumatic  Eyes: EOMI, no scleral icterus  Lungs: clear to auscultation bilaterally  Heart: regular rate and rhythm, S1, S2 normal, no murmur, click, rub or gallop  Abdomen: soft, non-tender; bowel sounds normal; no masses,  no organomegaly  Rectal: Deferred  Extremities: extremities normal, atraumatic, no cyanosis or edema  Lymph nodes: No palpable supraclavicular lymphadenopathy  Neurologic: Grossly normal.  CN Stewart-XII grossly intact, normal gait.      RADIOLOGY:    MRI today- awaiting formal read from radiology.  Per my review there is resolution of the hepatic lesions and the rectal primary is ill-defined but decreased in size from before.  I also see resolution/decrease in size of some of the previously seen mesorectal LN.    Labs:    No results  found for: WBC, HGB, HCT, PLT, LDH, CREATININE, AST, ALT, MG    Rayetta Humphrey, MD  Assistant Professor  [facility] Dept of Radiation Oncology  04/04/2018

## 2018-04-07 ENCOUNTER — Ambulatory Visit
Admit: 2018-04-07 | Discharge: 2018-04-07 | Payer: PRIVATE HEALTH INSURANCE | Attending: Surgical Oncology | Primary: Surgical Oncology

## 2018-04-07 ENCOUNTER — Ambulatory Visit: Admit: 2018-04-07 | Discharge: 2018-04-07 | Payer: PRIVATE HEALTH INSURANCE | Attending: MS" | Primary: MS"

## 2018-04-07 DIAGNOSIS — C787 Secondary malignant neoplasm of liver and intrahepatic bile duct: Secondary | ICD-10-CM

## 2018-04-07 DIAGNOSIS — C20 Malignant neoplasm of rectum: Principal | ICD-10-CM

## 2018-04-07 DIAGNOSIS — Z800 Family history of malignant neoplasm of digestive organs: Secondary | ICD-10-CM

## 2018-04-07 NOTE — Unmapped (Unsigned)
Patient Name: Max Stewart  Patient Age: 48 y.o.  Encounter Date: 04/07/2018    REFERRING PHYSICIAN:  Delrae Sawyers, MD  [address]  Surgery  [id number] Physician Office Building  [place], [place] [address]    CONSULTING PHYSICIANS:  Patient Care Team:  Dionisio Paschal, MD as PCP - General  [place] [address] [place] (Inactive)  Roni Bread, MD (Oncology)  Tammy Tobey Bride, ANP as Nurse Practitioner (Oncology)  Arnell Sieving, RN as Registered Nurse (Oncology Navigator)  Delrae Sawyers, MD (Surgical Oncology)    PRIMARY CARE PROVIDER:  DAVID Thurmond Butts, MD    REASON FOR VISIT: Metastatic rectal adenocarcinoma     HISTORY OF PRESENT ILLNESS:    Max Stewart is a 48 y.o. male with PMHx significant for type I DM who is seen in consultation at the request of Luana Shu* for rectal adenocarcinoma with metastatic disease to to the liver.  In January he started having symptoms of urgency with approximately 20-30 bowel of gas and mucus stools.  He started to also have a weight loss at this time.  He says he saw his physician who was concerned with irritable bowel syndrome initially and is unsure if the weight loss was due to his change in dietary habits either.  He continued to have symptoms and underwent a colonoscopy which showed a rectal mass (this was NOT inked at this time).  This was biopsied with path returning as invasive moderately to poorly differentiated rectal adenocarcinoma.  He underwent a CT chest abdomen pelvis which showed 5 liver metastases.  No disease noted in the chest.  He subsequently underwent an MRI which noted primary Low rectal tumor radiographically staged as T3d, N2, Mx and also showed 5 liver mets in segments 2, 4A, 4B, 5, 8.  He has completed neoadjuvant chemotherapy (FOLFOX) for 4 months, started FOLFOX on 10/14, last dose 03/20/18. Plan for chemoradiation (capecitabine) for 5 weeks to start 04/18/18.      He states he is doing well, much improved from initial vist.   He did have one episode of a fever with a negative work-up.  Having minimal symptoms mainly cold intolerance of his extremities.  His rectal symptoms he was experiencing have significantly improved since the first dose chemotherapy, he endorses regular BM 1-2/day.  His weight is currently stable at 210 lbs. He denies fevers, chills, pain, night sweats, n/v/d/c, no blood in stool. No urinary incontinence, no trouble with erection or ejaculation, no pain down legs or in his back.    He has not had any previous abdominal surgeries.  Not on any blood thinners.  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 He does have an insulin pump that he has had for years.      REVIEW OF SYSTEMS:   Remainder of a 10 system review of systems is negative.     ALLERGIES:  is allergic to ciprofloxacin and shellfish containing products.    MEDICATIONS: Reviewed in EPIC      MEDICAL HISTORY:  Type I DM    SURGICAL HISTORY:  Past Surgical History:   Procedure Laterality Date   ??? IR INSERT PORT AGE GREATER THAN 5 YRS  11/14/2017    IR INSERT PORT AGE GREATER THAN 5 YRS 11/14/2017 Ammie Dalton, MD IMG [facility]       SOCIAL HISTORY:   Denies tobacco use.  Occasional EtOH.  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   Wt Readings from Last 3 Encounters:   04/07/18 95.5 kg (210 lb 9.6 oz)   04/04/18 95.2 kg (209 lb 14.4 oz)   03/20/18 95.7 kg (210 lb 13.9 oz)       PHYSICAL EXAMINATION:     General Appearance:  No acute distress. Alert and oriented x 3.     Head:  Normocephalic, atraumatic.    Eyes: Conjunctiva and lids appear normal. Pupils equal and round, sclera anicteric.    Nose: Nares grossly normal, no drainage.    Neck: Supple, symmetrical.      Pulmonary: Normal respiratory effort. Lungs clear to ausculation bilaterally. No rhonchi. No wheeze.    Cardiovascular: Regular rate and rhythm.     Abdomen: Soft, non-tender, without masses.     Musculoskeletal: Normal gait. Extremities without clubbing, cyanosis or edema.    Neurologic:  No motor abnormalities noted. Sensation grossly intact.    Skin:  Skin color normal. No rashes or lesions.     Psychiatric: Judgement and insight seem appropriate. Oriented to person, place and time.     DRE: *** - did not hear direct exam comments by Dr. Chauncey Mann    Flex sig: No lesion identified       DIAGNOSTIC STUDIES:   Pathology  B. Rectum mass, cold biopsy:  - Invasive adenocarcinoma, moderately to poorly differentiated (see comment)    Labs  CEA 1.6  Cr 0.7  Albumin 3.8    IMAGING:  04/04/18  FINDINGS:  LOWER CHEST: Unchanged 3.0 cm nonenhancing right pericardial cyst (18:7).  HEPATOBILIARY: Marked interval improvement with near complete resolution of the previously identified rim enhancing hepatic lesions compatible with hepatic metastases. For reference:  -Hepatic segment 4A lesion, 0.8 cm (25:30), previously 1.8 cm and now only faintly visualized on the arterial phase.  -Hepatic segment 2 lesion, nonmeasurable, previously 1.3 cm (13:32).  -Hepatic segment 5 lesion, nonmeasurable, previously 1.3 cm (13:57).  -Hepatic segment 8 lesion, nonmeasurable, previously 1.0 cm (13:27).  -Hepatic segment 4B lesion, 0.3 cm (28:46), previously 0.7 cm (13:41).  No new suspicious liver lesions are identified.  Gallbladder sludge without evidence of gallbladder wall thickening or pericholecystic fluid. No biliary dilatation.  PANCREAS: Unremarkable.  SPLEEN: Small splenule, otherwise unremarkable.  ADRENAL GLANDS: Unremarkable.  KIDNEYS/URETERS: Unremarkable. No renal collecting system dilatation.   BLADDER: Unremarkable.  REPRODUCTIVE ORGANS: Partially imaged trace right hydrocele, otherwise unremarkable.  GI TRACT:   PRIMARY TUMOR:   - The primary tumor and extramural disease shows: dense low signal intensity fibrotic scar with no/minimal intermediate to high T2 signal how tumor previously did not demonstrate mucin/significant high T2 signal  - Distance of the inferior margin of treated tumor to the anal verge: Difficult to determine due to fibrotic tissue/treatment response, approximately 5.8 cm (10:16)  - Distance of the inferior margin of treated tumor to the top of sphincter complex/anorectal junction: Difficult to determine due to fibrotic tissue/treatment response  - Relationship to anterior peritoneal reflection: Below  - Craniocaudal length: Circumferential thickening, most pronounced along the posterior rectum measuring 4.5 cm (10:16)  - Previous craniocaudal length: 4.5 cm  - Extramural depth of invasion (  tumor and fibrosis): Absent.  - Previous extramural depth of invasion: 16 mm  T stage  - MR-T category:  T 1/2: Tumor confined to rectal wall  Structures with possible invasion: (if applicable)  - GU: None  - Pelvic sidewall: None   - Pelvic floor: Close to the bilateral levator ani musculature without obvious invasion.   - Sacrum: None   - Vessels: None   - Nerves: None   Functional sequences:  DWI: Restricted diffusion in the tumor bed is present, minimal  FOR LOW RECTAL TUMORS:  Involvement of anal sphincters: absent   MESORECTAL LYMPH NODES (superior rectal and mesorectal only) AND TUMOR DEPOSITS:  Previously identified mesorectal lymph nodes are smaller in size and are no longer visualized. For example, one now measures 0.3 cm (8:60), previously 0.6 cm.  Previously identified subcentimeter tumor deposits and extramural invasion is no longer definitively visualized.  IMA node: Absent   EXTRA-MESORECTAL LYMPH NODES:   Decreased size of a indeterminate left internal iliac node now measuring 0.3 cm (8:22), previously 0.4 cm.  EXTRAMURAL VENOUS INVASION (EMVI): Absent, previously present  No dilated or thick-walled loops of bowel elsewhere. No acute inflammatory process.   PERITONEUM/RETROPERITONEUM AND MESENTERY: No ascites.  VASCULATURE: Normal caliber of the abdominal aorta. Duplicated left and single right renal arteries. Portal and hepatic venous systems are patent. IVC is patent. A previously identified partially thrombosed ectatic right pelvic sidewall vessel now appears grossly patent.  LYMPH NODES: No enlarged lymph nodes in the abdomen or pelvis.  BONES/SOFT TISSUES: No suspicious osseous lesions. Degenerative changes at L5-S1. Focal regions of soft tissue stranding along the right anterior abdominal wall (5:2), query injection related.  ??  IMPRESSION:  - Since 10/18/2017, the primary tumor and extramural disease show near complete response. Post treatment category: TRG stage 2  -Previously identified extramural venous invasion is no longer visualized, with decreased size and/or resolution of previously identified mesorectal lymph nodes/tumor deposits.  -Marked interval improvement with near complete resolution of previously identified hepatic metastases as above.   -No new metastatic disease in the abdomen or pelvis.  ??  10/18/17 CT of chest  IMPRESSION:  No intrathoracic metastasis.  Well-defined 3.5 x 2.7 cm cystic lesion in the right cardiophrenic region that is abutting the right atrium, compatible with pericardial cyst.    ASSESSMENT:   ?? Metastatic rectal adenocarcinoma (T3dN2M1) 6 cm from anal verge, completed 8 cycles of chemotherapy rx with FOLFOX last dose 03/20/18, good treatment effect.  The primary tumor and extramural disease show near complete response. Post treatment category: TRG stage 2  ?? Liver metastases  Hepatic segment 4A lesion, 0.8 cm (25:30), previously 1.8 cm and now only faintly visualized on the arterial phase.  -Hepatic segment 2 lesion, nonmeasurable, previously 1.3 cm (13:32).  -Hepatic segment 5 lesion, nonmeasurable, previously 1.3 cm (13:57).  -Hepatic segment 8 lesion, nonmeasurable, previously 1.0 cm (13:27). -Hepatic segment 4B lesion, 0.3 cm (28:46), previously 0.7 cm (13:41).    ?? Surgical intervention for hepatic disease will be planned with a combination of ablation and resection as well as primary tumor LAR with diverting loop ileostomy   ?? Completed systemic therapy with Dr. Forbes Cellar 11/2017 FOLFOX start - last dose 03/20/18 - 8 cycles   - cycle 3 delayed for slow to recover grade 3 npenia  ?? Meet with genetics today  ?? Plan to move forward with chemoradiation therapy     PLAN:  ?? Continue with Dr. Carles Collet for chemoRT planned 04/24/18 - 05/29/18  ??  Will follow up 4 weeks after chemoRT - expect this to be around mid May, with MRI abdomen and pelvis and sigmoidoscopy in the office for preop evaluation    Gevena Barre, NP   Surgical Oncology

## 2018-04-07 NOTE — Unmapped (Unsigned)
Flexible sigmoidoscopy set up. [id number]

## 2018-04-07 NOTE — Unmapped (Signed)
??   Continue with Dr. Carles Collet for chemoRT planned 04/24/18 - 05/29/18  ?? Will follow up with MRI abdomen and pelvis 4 weeks after RT and sigmoidoscopy in the office for preop evaluation

## 2018-04-10 ENCOUNTER — Ambulatory Visit: Admit: 2018-04-10 | Discharge: 2018-05-09 | Payer: PRIVATE HEALTH INSURANCE

## 2018-04-10 DIAGNOSIS — K769 Liver disease, unspecified: Principal | ICD-10-CM

## 2018-04-10 DIAGNOSIS — Z510 Encounter for antineoplastic radiation therapy: Principal | ICD-10-CM

## 2018-04-10 DIAGNOSIS — C799 Secondary malignant neoplasm of unspecified site: Principal | ICD-10-CM

## 2018-04-10 DIAGNOSIS — C20 Malignant neoplasm of rectum: Principal | ICD-10-CM

## 2018-04-10 NOTE — Unmapped (Unsigned)
Patient Name: Max Stewart  Patient Age: 48 y.o.  Encounter Date: 04/07/2018    Referring Physician:   Delrae Sawyers, MD  [address]  Surgery  [id number] Physician Office Building  [place], [place] [address]    Primary Care Provider:  DAVID Thurmond Butts, MD    CONSULTING PHYSICIANS:  Patient Care Team:  Dionisio Paschal, MD as PCP - General  [place] [address] [place] (Inactive)  Roni Bread, MD (Oncology)  Tammy Tobey Bride, ANP as Nurse Practitioner (Oncology)  Arnell Sieving, RN as Registered Nurse (Oncology Navigator)  Delrae Sawyers, MD (Surgical Oncology)    ________________________  DIAGNOSIS:  ??   ________________________  PREVIOUS TREATMENT:  ??   ________________________  CURRENT TREATMENT:  ??   ________________________  DIAGNOSTIC STUDIES:   Pathology  B. Rectum mass, cold biopsy:  - Invasive adenocarcinoma, moderately to poorly differentiated (see comment)    Labs  CEA 1.6  Cr 0.7  Albumin 3.8    ________________________  IMAGING:    MRI pelvis (10/18/2017)  PRIMARY TUMOR:   - Distance to the anal verge: 6.3 cm (8:20)  - Distance to the top of sphincter complex/anorectal junction: 1.7 cm (8:20)  - Relationship to anterior peritoneal reflection: Below  - Craniocaudal length: 4.5 cm (8:20)  - Morphology: Circumferential   - Mucinous: No   ??  T stage  - Extramural depth of invasion: 1.6 cm  - MR-T category: ??T3d.   ??  Structures with possible invasion: (if applicable)  - GU: None.  - Pelvic sidewall: None   - Pelvic floor: The tumor is close to the bilateral levator ani muscle, more prominent on the right side without obvious evidence of invasion (10:13, 11:16) but with slight abutment of the levator ani on the right side.  - Sacrum: none   - Vessels: None   - Nerves: None   ??  FOR LOW RECTAL TUMORS:  Involvement of anal sphincters: absent. The inferior border of the tumor ends approximately 1.5 cm above the level of sphincter complex.   ??  EXTRAMURAL VENOUS INVASION (EMVI): Present (9:6-9).  ??  CIRCUMFERENTIAL RESECTION MARGIN: [For T3 Tumor Only]  - The tumor is close to the levator ani mucles with slight abutment on the right side and therefore circumferential resection margin could be positive.  ??  MESORECTAL LYMPH NODES (superior rectal and mesorectal only) AND TUMOR DEPOSITS:  ??  Mutiple (4) mesorectal lymph nodes with heterogenous dark T2 signal measuring up to 0.6 cm.  ??  Multiple subcentimeter tumor deposits. Multifocal extramural invasion is noted.  ??  EXTRA-MESORECAL LYMPH NODES:   ??  A 0.4 cm left internal iliac node (9:8) is also noted, indeterminate.   ??  No IMA enlarged lymph nodes.  ??  OTHER: There is small partially thrombosed ectatic right pelvic sidewall vessel measuring up to 1.1 cm (9:7).  ??  Moderately distended urinary bladder. Distal ureters are nonvisualized and likely decompressed. Seminal vesicles and prostate gland are unremarkable. No obstruction or bowel inflammation. No suspicious osseous lesions. External soft tissues are unremarkable.  ??  IMPRESSION:  - Low rectal tumor radiographically staged as T3d, N2, Mx  -The tumor is close to the levator ani mucles with slight abutment on the right side and therefore circumferential resection margin could be positive.  -Extramural venous invasion is positive. There are likely mesorectal tumor deposits.  - Clear sphincter margin.  MRI abdomen (9/27)  HEPATOBILIARY: There are approximately 5 scattered hepatic  lesions which demonstrate T1 hypointensity, mild T2 hyperintensity, restricted diffusion, and peripheral rim enhancement, most compatible with hepatic metastasis:  -Hepatic segment 4A lesion, 1.8 cm (13:33), unchanged.  -Hepatic segment 2 lesion, 1.3 cm (13:32), unchanged.  -Hepatic segment 5 lesion, 1.3 cm (13:57), unchanged.  -Hepatic segment 8 lesion, 1.0 cm (13:27), unchanged.  -Hepatic segment 4B lesion, 0.7 cm (13:41), unchanged.  ??  No biliary ductal dilatation.   ??  GALLBLADDER: Small amount of gallbladder sludge. No cholelithiasis, gallbladder wall thickening, or pericholecystic fluid.  PANCREAS: Unremarkable.  SPLEEN: No splenomegaly. Small splenule.  ADRENAL GLANDS: Unremarkable.  KIDNEYS/URETERS: Unremarkable.  ??  VISUALIZED BOWEL/PERITONEUM/RETROPERITONEUM: Susceptibility artifact in the right lower quadrant. No bowel obstruction. No acute inflammatory process. No ascites.   ??  VASCULATURE: Abdominal aorta within normal limits for patient's age. Unremarkable inferior vena cava. Hepatic veins, portal veins, SMV, and splenic vein are patent. Duplicated left and single right renal arteries.  LYMPH NODES: No adenopathy.  ??  BONES : No suspicious osseous lesions. Multilevel degenerative changes of the spine.  SOFT TISSUES: Unremarkable.  ??  IMPRESSION:  ??  Approximately 5 metastases scattered throughout both hepatic lobes as detailed above.  ________________________  ASSESSMENT:   ?? Metastatic rectal adenocarcinoma ([id number]) currently undergoing  chemotherapy rx with FOLFOX.    Liver metastases   -Hepatic segment 4A lesion, 1.8 cm (13:33), unchanged   Hepatic segment 4B lesion, 0.7 cm (13:41), unchanged.   -Hepatic segment 2 lesion, 1.3 cm (13:32), unchanged.   -Hepatic segment 5 lesion, 1.3 cm (13:57), unchanged.   -Hepatic segment 8 lesion, 1.0 cm (13:27), unchanged.     ?? If his liver disease does not progress, it is possible that  through a combination of ablation and resection it may be surgically treatable.    _________________________  PLAN:  ??       Follow Up Note:    Max Stewart is a 48 y.o. male who is seen here for ***        Vital Signs for this encounter:  BSA: 2.19 meters squared  BP 111/69  - Pulse 67  - Temp 37 ??C (98.6 ??F) (Oral)  - Resp 14  - Wt 95.5 kg (210 lb 9.6 oz)  - SpO2 98%  - BMI 29.37 kg/m??   Wt Readings from Last 3 Encounters:   04/07/18 95.5 kg (210 lb 9.6 oz)   04/04/18 95.2 kg (209 lb 14.4 oz)   03/20/18 95.7 kg (210 lb 13.9 oz)       PHYSICAL EXAMINATION:     General Appearance:  No acute distress. Alert and oriented x 3.     Eyes: Conjunctiva and lids appear normal. Pupils equal and round, extraocular motility grossly intact sclera anicteric.    Pulmonary: Normal work of breathing on room air.    Cardiovascular: Regular rate and rhythm.     Abdomen: ***    Musculoskeletal: Normal gait. Extremities without clubbing, cyanosis or edema.    Neurologic:  Nonfocal. No motor abnormalities noted.    Lymphatic: No cervical or supraclavicular LAN.       MEDICAL HISTORY:  No past medical history on file.    SURGICAL HISTORY:  Past Surgical History:   Procedure Laterality Date   ??? IR INSERT PORT AGE GREATER THAN 5 YRS  11/14/2017    IR INSERT PORT AGE GREATER THAN 5 YRS 11/14/2017 Ammie Dalton, MD IMG [facility]         REASON FOR VISIT: Metastatic rectal  adenocarcinoma     HISTORY OF PRESENT ILLNESS:    Max Stewart is a 48 y.o. male with PMHx significant for type I DM who is seen in consultation at the request of Luana Shu* for rectal adenocarcinoma with metastatic disease to to the liver.  In January he started having symptoms of urgency with approximately 20-30 bowel of gas and mucus stools.  He started to also have a weight loss at this time.  He says he saw his physician who was concerned with irritable bowel syndrome initially and is unsure if the weight loss was due to his change in dietary habits either.  He continued to have symptoms and underwent a colonoscopy which showed a rectal mass (this was NOT inked at this time).  This was biopsied with path returning as invasive moderately to poorly differentiated rectal adenocarcinoma.  He underwent a CT chest abdomen pelvis which showed 5 liver metastases.  No disease noted in the chest.  He subsequently underwent an MRI which also showed 5 liver mets in segments 2, 4A, 4B, 5, 8.  He was seen by medical oncology with plans for neoadjuvant chemotherapy (FOLFOX) for 4 months followed by chemoradiation (capecitabine) for 5 weeks.  He started FOLFOX on 10/14.    Since then he states he is doing well.  Has undergone 2 cycles.  Did have one episode of a fever with a negative work-up.  Having minimal symptoms mainly cold intolerance of his extremities.  He feels that the rectal symptoms he is experiencing have diminished since the chemotherapy.  Since the start of his symptoms he has had approximately a 40 pound weight loss.      He has not had any previous abdominal surgeries.  Not on any blood thinners.  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

## 2018-04-11 NOTE — Unmapped (Signed)
Referring Provider:   Roni Bread    Primary Provider:   DAVID Thurmond Butts, MD    Reason for Visit:   Diagnosis ICD-10-CM Associated Orders   1. 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 Family history of colon cancer Z80.0 Miscellaneous DNA Sendout     Miscellaneous DNA Sendout       HISTORY OF PRESENT ILLNESS: Max Stewart is a 48 y.o. male with a personal history of metastatic rectal cancer. He was referred to the [facility] to discuss the possibility of an hereditary predisposition to cancer, genetic testing, and to further clarify his future cancer risks, as well as potential cancer risk for family members due to his personal and family history of cancer.  Max Stewart is accompanied to clinic by his wife, Crystal.  The history is collected from available records in Epic and verbal report of Max Stewart.         Rectal adenocarcinoma (CMS-HCC)    11/04/2017 Initial Diagnosis     Rectal adenocarcinoma (CMS-HCC)      11/21/2017 - 04/01/2018 Chemotherapy     OP GI FOLFOX (ADJUVANT)  Oxaliplatin 85 mg/m2 IV on Day 1  Leucovorin 400 mg/m2 IV on Day 1  5-FU bolus 400 mg/m2 IV + 5-FU continuous infusion 2400 mg/m2 over 46 hours  Every 14 days        Medical history relevant to hereditary cancer evaluation includes:  1. Rectal cancer  1. Diagnosed with metastatic rectal cancer at age 55  2. No IHC or MSI performed.   3. Chemotherapy regimen described above.   4. Radiation therapy is ongoing.   2. Max Stewart did not have screening colonoscopy prior to this diagnosis.   3. Family history of cancer described below.     No past medical history on file.    Past Surgical History:   Procedure Laterality Date   ??? IR INSERT PORT AGE GREATER THAN 5 YRS  11/14/2017    IR INSERT PORT AGE GREATER THAN 5 YRS 11/14/2017 Ammie Dalton, MD IMG VIR [facility]       PSYCHOSOCIAL HISTORY: Max Stewart lives with his wife, Aggie Cosier (married 30 years). 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     FAMILY HISTORY:   During the visit, a 4-generation pedigree was obtained.    Children:   ?? Daughter (20) - cancer-free  ?? Daughter (16) - cancer-free  Siblings:   ?? Brother Thayer Ohm, 76) - cancer-free  Mother and maternal relatives:  ?? Mother Harriett Sine, ~16-10) - cancer-free  ?? Aunt Darel Hong, d. ?) - diagnosed with an unknown type of cancer  ?? Uncle (d. ?) - diagnosed with an unknown type of cancer  ?? Aunt Erskine Squibb, ?) - cancer-free  ?? Cousin (Tammy, d. ~42-44) - diagnosed with colon cancer at age ~65-44  ?? There are 5 maternal aunts who are cancer-free  ?? Grandfather (d. 30s) - cancer-free, died in an accident  ?? Grandmother (d. ?) - Max Stewart had no contact with this family member.   Father and paternal relatives:   ?? Father (2s) - cancer-free  ?? Aunt Boyd Kerbs, 50s) - cancer-free, half-sibling to father  ?? Uncle Peyton Najjar, d. 49s) - cancer-free, half-sibling to father  ?? Grandmother (d. >) -  diagnosed with an unknown type of cancer  ?? Great aunt (d. ?) - cancer-free  ?? First cousin once removed (~44) - diagnosed with colon cancer at age 60  ?? Grandfather (d. >50) - cancer-free    Max Stewart family is Caucasian with ancestors from [place].  Ashkenazi Jewish ancestry is denied for both maternal and paternal families.    ASSESSMENT: Max Stewart is a 48 y.o. male with a personal history of cancer which may be suggestive of an inherited predisposition to cancer. He was diagnosed with metastatic rectal cancer at the relatively young age of 47. He has additional family history of colon cancer in the 36s in a maternal first cousin and paternal first cousin once removed. Given that Max Stewart has not had surgical resection of his tumor, his tumor has not been evaluated for Lynch syndrome by immunohistochemistry and microsatellite studies. We recommended comprehensive genetic testing for hereditary colorectal cancer in Max Stewart. If Max Stewart is found to have hereditary colorectal cancer, there would be significant implications for his management and risk stratification in family members.     GENETIC TESTING: We discussed the methodology and potential implications of the following genetic test(s):  [facility] Colorectal Cancer Guidelines-Based Panel: next-generation sequencing and deletion/duplication testing of APC, BMPR1A, EPCAM*, MLH1, MSH2, MSH6, MUTYH, PMS2, PTEN, SMAD4, STK11, TP53 genes; $1500 via [facility]; 3-week TAT (*deletion/duplication only)    DISCUSSION: We reviewed the characteristics, features and inheritance patterns of hereditary cancer syndromes. We discussed genetic testing, including the appropriate family members to test, the process of testing, and insurance coverage. We also discussed the implications of a positive, negative and/ or variant of uncertain significance genetic test result.     After careful consideration of the benefits and limitations of genetic testing of the genes on the [facility] Colorectal Cancers Guidelines Based Panel Max Stewart decided to pursue testing. A blood sample was obtained and sent to [facility] for next-generation sequencing and deletion/duplication analysis of the recommended genes. Max Stewart stated that he wishes to discuss results via phone. We will discuss risk management in more detail during the results disclosure, as appropriate according to results of genetic testing.    PLAN:  1. Blood draw for genetic testing at [facility].   2. Results will be delivered by phone in 2-3 weeks.     We encouraged Max Stewart to remain in contact with cancer genetics annually so that we can continuously update the family history and inform him of any changes in cancer genetics and testing that may be of benefit for this family. Mr.  Stewart questions were answered to his satisfaction today. Our contact information was provided should additional questions or concerns arise.     Thank you for the referral and allowing [place] to share in the care of your patient.     Lesia Hausen, MS, [facility]  Genetic Counselor  Department of Genetics      This patient was discussed with Dr. Lowell Guitar who agrees with the above assessment and plan. Total time spent by Lesia Hausen, MS, CGC in face-to-face counseling was approximately 30 minutes.

## 2018-04-12 NOTE — Unmapped (Signed)
I was the supervising physician in the delivery of the service.    Amie Portland, MD, PhD  Medical Geneticist

## 2018-04-21 NOTE — Unmapped (Signed)
April 21, 2018       [name]  [address]  [place] [address]      Dear [name],    It was a pleasure meeting with you in the [facility] on April 07, 2018. We are sending this letter as a summary of our discussion and your genetic test results. Your family tree is also included.     GENETIC TESTING: Most cancer is sporadic, meaning it happens by chance. About 10% of cancer is hereditary, or due to strong inherited risk factors. Families with features suggestive of hereditary risk tend to have multiple family members diagnosed with the same or related cancers (such as breast and ovarian cancer), diagnoses in multiple generations, diagnoses before age 3, and people diagnosed with two primary (new) cancers.      Due to your personal history of rectal cancer at age 48, we recommended you pursue genetic testing of 19 genes that are associated with hereditary colorectal cancer syndromes. Your genetic testing was performed at [facility]. Testing did not reveal any known pathogenic mutations. This is a normal test result. Since the current test is not perfect, it is possible there may be a mutation that current testing cannot detect, but that chance is small.     CANCER SCREENING: This result suggests that your cancers were most likely not due to an inherited predisposition. This result is reassuring that you do not have Lynch syndrome or another well-understood hereditary predisposition to cancer. Most cancers happen by chance and your negative test suggests that the cancers fall into this category. We recommend that you follow the cancer screening and management guidelines provided by your primary physician.    FAMILY MEMBERS: Your relatives are at a somewhat increased risk of developing colon cancer simply due to your diagnosis of colorectal cancer.  Your first degree relatives should share their family history of colon cancer with their health care providers to determine appropriate age and intervals for screening for colon cancer.    Again, it has been a pleasure working with you. Cancer genetics is a rapidly advancing field and it is possible that new genetic tests will be appropriate for you in the future. We strongly encourage you to remain in contact with [place] on an annual basis so we can update your personal and family histories and let you know of advances in cancer genetics that may benefit you and your family. If we can be of further assistance please call us at [phone].    Sincerely,    Max Hausen, MS, CGC, Genetic Counselor    Max Portland, MD, PhD, Medical Geneticist      Enclosures: Genetic test results, family tree

## 2018-04-21 NOTE — Unmapped (Signed)
April 21, 2018 3:33 PM    Left VM for Mr. Giangrande with good news about genetic testing results. I will alert his care team and send a summary letter with a copy of the results.     Lesia Hausen, MS, [facility]  Chief Operating Officer of [employer]

## 2018-04-24 NOTE — Unmapped (Unsigned)
04/24/2018  Did not stay for status check  Dose Site Summary:    Subjective/Assessment/Recommendations:    1. Fatigue:  2. Pain:  3. Urinary Elimination:  4. Bowel Elimination:  5. Prescription Needs:  6. Psychosocial:  7. Other:

## 2018-04-25 DIAGNOSIS — C20 Malignant neoplasm of rectum: Principal | ICD-10-CM

## 2018-04-25 NOTE — Unmapped (Signed)
RADIATION TREATMENT MANAGEMENT NOTE     Encounter Date: 04/25/2018  Patient Name: Max Stewart  Medical Record Number: [id number]    DIAGNOSIS:  48yo with metastatic distal rectal cancer with imaging demonstrating 5 distinct lesions consistent with liver metastases.  He is now s/p neoadjuvant FOLFOX with a good response on imaging from today.    ASSESSMENT: 720cGy of planned 5040cGy  Karnofsky/Lansky Performance Status: 100, Fully active, able to carry on all pre-disease performed without restriction (ECOG equivalent 0)  Chemotherapy/Systemic therapy:administered concurrently (xeloda)  Clinical Trial:   no    RECOMMENDATIONS:  1. Plan for Therapy: Continue treatment as planned  2. GI:  No issues  3. GU: No issues  4. He is taking xeloda without problems    SUBJECTIVE: Started treatment last week without any issues.  His stools are a little looser than baseline but nothing that bothers him.  No urinary issues.  Taking xeloda- no hand/feet/mouth rashes.    PHYSICAL EXAM:  Vital Signs for this encounter:  BP 119/59  - Pulse 67  - Temp 36.2 ??C (97.2 ??F) (Tympanic)  - Resp 16  - Wt 98.1 kg (216 lb 3.2 oz)  - SpO2 98%  - BMI 30.15 kg/m??   Last weight:    Wt Readings from Last 4 Encounters:   04/25/18 98.1 kg (216 lb 3.2 oz)   04/07/18 95.5 kg (210 lb 9.6 oz)   04/04/18 95.2 kg (209 lb 14.4 oz)   03/20/18 95.7 kg (210 lb 13.9 oz)     General:  Alert and Orientated X 3.  No acute distress.    Skin: No rashes on hands, mouth    I have reviewed the patient's dose delivery, dosimetry, lab tests, patient treatment set-up, port films, treatment parameters and x-rays.    Rayetta Humphrey, MD  Assistant Professor  [facility] Dept of Radiation Oncology  04/25/2018

## 2018-04-25 NOTE — Unmapped (Signed)
Max Stewart here for status check with Dr. Carles Collet.    SiteLast TxDose  Rx:Ini Rectum: 04/24/2018: 720/4,500 cGy  Rx:BST Rectu: : 0/540 cGy  IOE:VOJJK Rect: 04/24/2018: 720 cGy    Pain/Swelling: No issues  Discharge: No issue  Skin: No issues  Energy Level/Fatigue/Active Level: It's still fine  Elimination: stools are getting loose  Appetite/Diet: Fine  Nausea/Vomitting: No issues     Chemo: complete  Weight: stable  Wt Readings from Last 3 Encounters:   04/25/18 98.1 kg (216 lb 3.2 oz)   04/07/18 95.5 kg (210 lb 9.6 oz)   04/04/18 95.2 kg (209 lb 14.4 oz)     Notified Dr. Carles Collet

## 2018-05-01 ENCOUNTER — Encounter
Admit: 2018-05-01 | Discharge: 2018-05-09 | Payer: PRIVATE HEALTH INSURANCE | Attending: Radiation Oncology | Primary: Radiation Oncology

## 2018-05-01 DIAGNOSIS — Z9221 Personal history of antineoplastic chemotherapy: Principal | ICD-10-CM

## 2018-05-01 DIAGNOSIS — Z510 Encounter for antineoplastic radiation therapy: Principal | ICD-10-CM

## 2018-05-01 DIAGNOSIS — C20 Malignant neoplasm of rectum: Principal | ICD-10-CM

## 2018-05-01 DIAGNOSIS — C787 Secondary malignant neoplasm of liver and intrahepatic bile duct: Principal | ICD-10-CM

## 2018-05-01 NOTE — Unmapped (Signed)
RADIATION TREATMENT MANAGEMENT NOTE     Encounter Date: 05/01/2018  Patient Name: Max Stewart  Medical Record Number: [id number]    DIAGNOSIS:  80XB with metastatic distal rectal cancer with imaging demonstrating 5 distinct lesions consistent with liver metastases.  He is now s/p neoadjuvant FOLFOX with a good response on imaging from today.    ASSESSMENT: 1440cGy of planned 5040cGy  Karnofsky/Lansky Performance Status: 100, Fully active, able to carry on all pre-disease performed without restriction (ECOG equivalent 0)  Chemotherapy/Systemic therapy:administered concurrently (xeloda)  Clinical Trial:   no    RECOMMENDATIONS:  1. Plan for Therapy: Continue treatment as planned  2. GI:  A few issues this week- will monitor closely  3. GU: Noticing a few obstructive symptoms- can consider flomax if this worsens  4. He is taking xeloda without problems    SUBJECTIVE: Tolerating treatment well.  He has noticed a few more GI issues including mild tensmus, urgency, mucous.  Stools are loose but not diarrhea.  He is having some incomplete urinary emptying and frequency- no dysuria.  Not bothersome currently.    PHYSICAL EXAM:  Vital Signs for this encounter:  BP 124/71  - Pulse 67  - Temp 36.2 ??C (97.2 ??F) (Temporal)  - Wt 99.2 kg (218 lb 9.6 oz)  - SpO2 97%  - BMI 30.49 kg/m??   Last weight:    Wt Readings from Last 4 Encounters:   05/01/18 99.2 kg (218 lb 9.6 oz)   04/25/18 98.1 kg (216 lb 3.2 oz)   04/07/18 95.5 kg (210 lb 9.6 oz)   04/04/18 95.2 kg (209 lb 14.4 oz)     General:  Alert and Orientated X 3.  No acute distress.    Skin: No rashes on hands, mouth    I have reviewed the patient's dose delivery, dosimetry, lab tests, patient treatment set-up, port films, treatment parameters and x-rays.    Rayetta Humphrey, MD  Assistant Professor  [facility] Dept of Radiation Oncology  05/01/2018

## 2018-05-01 NOTE — Unmapped (Signed)
05/01/2018    Dose Site Summary:  Rx:Ini Rectum: 04/28/2018: 1,440/4,500 cGy  Rx:BST Rectu: : 0/540 cGy  ZOX:WRUEA Rect: 04/28/2018: 1,440 cGy  Subjective/Assessment/Recommendations:    1. Nutrition:   2. Esophagitis:  3. Chemo: taking po xeloda.   4. Smoking Cessation:  5. Fatigue:   6. Pain:  7. Elimination: states increased  Gas and mucous.   8. Prescription Needs:  9. Psychosocial:  10. Other:

## 2018-05-02 NOTE — Unmapped (Signed)
[facility] Refill Coordination Note    Specialty Medication(s) to be Shipped:   Hematology/Oncology: Capecitabine 500mg     Other medication(s) to be shipped: N/A     Max Stewart, DOB: 04/05/1970  Phone: [phone] (home)       All above HIPAA information was verified with patient.     Completed refill call assessment today to schedule patient's medication shipment from the [facility] [phone]).       Specialty medication(s) and dose(s) confirmed: Regimen is correct and unchanged.   Changes to medications: Bryam reports no changes reported at this time.  Changes to insurance: No  Questions for the pharmacist: No    Confirmed patient received Welcome Packet with first shipment. The patient will receive a drug information handout for each medication shipped and additional FDA Medication Guides as required.       DISEASE/MEDICATION-SPECIFIC INFORMATION        N/A    SPECIALTY MEDICATION ADHERENCE     Medication Adherence    Patient reported X missed doses in the last month:  0  Specialty Medication:  Xeloda  Patient is on additional specialty medications:  No  Patient is on more than two specialty medications:  No  Any gaps in refill history greater than 2 weeks in the last 3 months:  no                Xeloda 500 mg: 7 days of medicine on hand         SHIPPING     Shipping address confirmed in Epic.     Delivery Scheduled: Yes, Expected medication delivery date: 05/09/2018.     Medication will be delivered via Next Day Courier to the home address in Epic [place].    Kindell Strada Perlie Gold   [facility] Specialty Technician

## 2018-05-02 NOTE — Unmapped (Signed)
Final Attempt Per State of Emergency Protocol    The [facility] has made a Second and final attempt to reach this patient to refill the following medication:capecitabine 500.      We have Left voicemails on the following phone numbers: [phone] .    Dates contacted: 03/19 - 03/24  Last scheduled delivery: 04/03/2018      The patient may be at risk of non-compliance with this medication. The patient should call the [facility] at [phone] (option 4) to refill medication.    Torrie Namba [employer]   [facility] Specialty Technician

## 2018-05-08 ENCOUNTER — Encounter
Admit: 2018-05-08 | Discharge: 2018-05-09 | Payer: PRIVATE HEALTH INSURANCE | Attending: Radiation Oncology | Primary: Radiation Oncology

## 2018-05-08 DIAGNOSIS — Z510 Encounter for antineoplastic radiation therapy: Principal | ICD-10-CM

## 2018-05-08 DIAGNOSIS — C20 Malignant neoplasm of rectum: Principal | ICD-10-CM

## 2018-05-08 DIAGNOSIS — Z9221 Personal history of antineoplastic chemotherapy: Principal | ICD-10-CM

## 2018-05-08 DIAGNOSIS — C787 Secondary malignant neoplasm of liver and intrahepatic bile duct: Principal | ICD-10-CM

## 2018-05-08 MED FILL — CAPECITABINE 500 MG TABLET: 8 days supply | Qty: 48 | Fill #1 | Status: AC

## 2018-05-08 MED FILL — CAPECITABINE 500 MG TABLET: ORAL | 8 days supply | Qty: 48 | Fill #1

## 2018-05-08 NOTE — Unmapped (Signed)
05/08/2018    Dose Site Summary:  Rx:Ini Rectum: 05/08/2018: 2,520/4,500 cGy  Rx:BST Rectu: : 0/540 cGy  ZOX:WRUEA Rect: 05/08/2018: 2,520 cGy  Subjective/Assessment/Recommendations:    1. Fatigue: no issues.   2. Pain:  3. Urinary Elimination: denies inew issues  4. Bowel Elimination: denies new issues  5. Prescription Needs:  6. Psychosocial:  7. Other:

## 2018-05-09 NOTE — Unmapped (Signed)
RADIATION TREATMENT MANAGEMENT NOTE     Encounter Date: 05/08/2018  Patient Name: Max Stewart  Medical Record Number: [id number]    DIAGNOSIS:  48yo with metastatic distal rectal cancer with imaging demonstrating 5 distinct lesions consistent with liver metastases.  He is now s/p neoadjuvant FOLFOX with a good response on imaging from today.    ASSESSMENT: 2520cGy of planned 5040cGy  Karnofsky/Lansky Performance Status: 100, Fully active, able to carry on all pre-disease performed without restriction (ECOG equivalent 0)  Chemotherapy/Systemic therapy:administered concurrently (xeloda)  Clinical Trial:   no    RECOMMENDATIONS:  1. Plan for Therapy: Continue treatment as planned  2. GI:  No issues this week  3. GU: Noticing a few obstructive symptoms previously- stable today.  can consider flomax if this worsens  4. He is taking xeloda without problems    SUBJECTIVE:  Doing well in clinic today with no new issues.  Still having some mild bowel urgency and occasional mucous but not too severe.  Taking xeloda without any issues    PHYSICAL EXAM:  Vital Signs for this encounter:  BP 125/78  - Pulse 70  - Temp 36.1 ??C (97 ??F) (Temporal)  - Wt 97.8 kg (215 lb 8 oz)  - SpO2 98%  - BMI 30.06 kg/m??   Last weight:    Wt Readings from Last 4 Encounters:   05/08/18 97.8 kg (215 lb 8 oz)   05/01/18 99.2 kg (218 lb 9.6 oz)   04/25/18 98.1 kg (216 lb 3.2 oz)   04/07/18 95.5 kg (210 lb 9.6 oz)     General:  Alert and Orientated X 3.  No acute distress.    Skin: No rashes on hands, mouth    I have reviewed the patient's dose delivery, dosimetry, lab tests, patient treatment set-up, port films, treatment parameters and x-rays.    Rayetta Humphrey, MD  Assistant Professor  [facility] Dept of Radiation Oncology  05/09/2018

## 2018-05-10 ENCOUNTER — Ambulatory Visit: Admit: 2018-05-10 | Discharge: 2018-06-08 | Payer: PRIVATE HEALTH INSURANCE

## 2018-05-10 DIAGNOSIS — C799 Secondary malignant neoplasm of unspecified site: Secondary | ICD-10-CM

## 2018-05-10 DIAGNOSIS — Z510 Encounter for antineoplastic radiation therapy: Principal | ICD-10-CM

## 2018-05-10 DIAGNOSIS — C20 Malignant neoplasm of rectum: Secondary | ICD-10-CM

## 2018-05-15 ENCOUNTER — Encounter
Admit: 2018-05-15 | Discharge: 2018-06-08 | Payer: PRIVATE HEALTH INSURANCE | Attending: Radiation Oncology | Primary: Radiation Oncology

## 2018-05-15 DIAGNOSIS — C20 Malignant neoplasm of rectum: Principal | ICD-10-CM

## 2018-05-15 NOTE — Unmapped (Signed)
RADIATION TREATMENT MANAGEMENT NOTE     Encounter Date: 05/15/2018  Patient Name: Max Stewart  Medical Record Number: [id number]    DIAGNOSIS:  48yo with metastatic distal rectal cancer with imaging demonstrating 5 distinct lesions consistent with liver metastases.  He is now s/p neoadjuvant FOLFOX with a good response on imaging from today.    ASSESSMENT: 3240cGy of planned 5040cGy  Karnofsky/Lansky Performance Status: 100, Fully active, able to carry on all pre-disease performed without restriction (ECOG equivalent 0)  Chemotherapy/Systemic therapy:administered concurrently (xeloda)  Clinical Trial:   no    RECOMMENDATIONS:  1. Plan for Therapy: Continue treatment as planned  2. GI:  More symptoms this week- discussed dietary modifications.  Can consider imodium if this worsens in the future.  3. GU: Noticing a few obstructive symptoms previously- stable today.  can consider flomax if this worsens  4. He is taking xeloda without problems    SUBJECTIVE:  Having some more mild GI issues this week.  He has noticed more urgency, loose stool after eating some meals.  It tends to be certain foods that he has issues with (greasy foods, salads, etc).  No issues with a grilled chicken sandwich over the weekend.  Not overly bothersome to him.  He still has some mild LUTS- sensation of incomplete emptying and some mild frequency.  Not bothersome.  He prefers to avoid starting new medications if possible.    PHYSICAL EXAM:  Vital Signs for this encounter:  BP 122/72  - Pulse 72  - Temp 36 ??C (96.8 ??F) (Temporal)  - Wt 98.9 kg (218 lb 1.6 oz)  - SpO2 96%  - BMI 30.42 kg/m??   Last weight:    Wt Readings from Last 4 Encounters:   05/15/18 98.9 kg (218 lb 1.6 oz)   05/08/18 97.8 kg (215 lb 8 oz)   05/01/18 99.2 kg (218 lb 9.6 oz)   04/25/18 98.1 kg (216 lb 3.2 oz)     General:  Alert and Orientated X 3.  No acute distress.    Skin: No rashes on hands, mouth    I have reviewed the patient's dose delivery, dosimetry, lab tests, patient treatment set-up, port films, treatment parameters and x-rays.    Rayetta Humphrey, MD  Assistant Professor  [facility] Dept of Radiation Oncology  05/15/2018

## 2018-05-15 NOTE — Unmapped (Signed)
05/15/2018    Dose Site Summary:  Rx:Ini Rectum: 05/12/2018: 3,240/4,500 cGy  Rx:BST Rectu: : 0/540 cGy  ZOX:WRUEA Rect: 05/12/2018: 3,240 cGy  Subjective/Assessment/Recommendations:    1. Nutrition:  2. Chemo:capecitbine taken twice a day. Denies nausea.   3. Nausea/Vomiting: none  4. Urinary elimination: denies issues  5. Bowel elimination: States 8-9 bm a day. Every time after eating even a snack. States there is urgency then constipation.   6. Fatigue: energy is good. Continues to work.   7. Pain: denies.  8. Prescription Needs:   9. Psychosocial:   10. Other:

## 2018-05-22 DIAGNOSIS — C787 Secondary malignant neoplasm of liver and intrahepatic bile duct: Secondary | ICD-10-CM

## 2018-05-22 DIAGNOSIS — Z510 Encounter for antineoplastic radiation therapy: Principal | ICD-10-CM

## 2018-05-22 DIAGNOSIS — C20 Malignant neoplasm of rectum: Secondary | ICD-10-CM

## 2018-05-22 NOTE — Unmapped (Signed)
05/22/2018    Dose Site Summary:  Rx:Ini Rectum: 05/22/2018: 4,320/4,500 cGy  Rx:BST Rectu: : 0/540 cGy  GNF:AOZHY Rect: 05/22/2018: 4,320 cGy  Subjective/Assessment/Recommendations:    1. Fatigue: energy is good.  2. Pain: none  3. Urinary Elimination:  4. Bowel Elimination: no change  5. Prescription Needs:  6. Psychosocial:  7. Other:

## 2018-05-22 NOTE — Unmapped (Signed)
RADIATION TREATMENT MANAGEMENT NOTE     Encounter Date: 05/22/2018  Patient Name: Max Stewart  Medical Record Number: [id number]    DIAGNOSIS:  48yo with metastatic distal rectal cancer with imaging demonstrating 5 distinct lesions consistent with liver metastases.  He is now s/p neoadjuvant FOLFOX with a good response on imaging from today.    ASSESSMENT: 4320cGy of planned 5040cGy  Karnofsky/Lansky Performance Status: 100, Fully active, able to carry on all pre-disease performed without restriction (ECOG equivalent 0)  Chemotherapy/Systemic therapy:administered concurrently (xeloda)  Clinical Trial:   no    RECOMMENDATIONS:  1. Plan for Therapy: Continue treatment as planned  2. GI:  Symptoms stable this week- managed with dietary modifications.  Can consider imodium if this worsens in the future.  3. GU: Noticing a few obstructive symptoms previously- continues to be stable today.  can consider flomax if this worsens  4. He is taking xeloda without problems  5. Follow-up:  He discussed repeat imaging in 4-6 weeks with Dr. Chauncey Mann previously- will reach out to their team to confirm scheduling.  I will tentatively see back in 4 months and can adjust as needed    SUBJECTIVE:  No major changes this week.  Still has some issues with diarrhea/urgency that is mostly related to certain foods.  Still having some mild urinary irritation with mild urgency/frequency but no worse from prior.  Very happy with the way things have gone during treatment so far.    PHYSICAL EXAM:  Vital Signs for this encounter:  BP 116/69  - Pulse 62  - Temp 36.4 ??C (97.5 ??F) (Temporal)  - Wt 98.9 kg (218 lb)  - BMI 30.40 kg/m??   Last weight:    Wt Readings from Last 4 Encounters:   05/22/18 98.9 kg (218 lb)   05/15/18 98.9 kg (218 lb 1.6 oz)   05/08/18 97.8 kg (215 lb 8 oz)   05/01/18 99.2 kg (218 lb 9.6 oz)     General:  Alert and Orientated X 3.  No acute distress.    Skin: No rashes on hands, mouth    I have reviewed the patient's dose delivery, dosimetry, lab tests, patient treatment set-up, port films, treatment parameters and x-rays.    Rayetta Humphrey, MD  Assistant Professor  [facility] Dept of Radiation Oncology  05/22/2018

## 2018-06-02 NOTE — Unmapped (Unsigned)
Patient states that they are done with Capecitabine 500 mg and radiation.   Do not need medication sent

## 2018-06-05 NOTE — Unmapped (Signed)
RADIATION ONCOLOGY TREATMENT COMPLETION NOTE    Encounter Date: 05/26/2018  Patient Name: Max Stewart  Medical Record Number: [id number]    Referring Physician: No referring provider defined for this encounter.    Primary Care Provider: Hetty Ely, MD    DIAGNOSIS:  28BM with metastatic distal rectal cancer with imaging demonstrating 5 distinct lesions consistent with liver metastases.????He is now s/p neoadjuvant FOLFOX with a good response on imaging.    TREATMENT INTENT: curative    CLINICAL TRIAL: no    CHEMOTHERAPY: administered concurrently (xeloda)    RADIATION TREATMENT SUMMARY:          Treatment site Treatment Technique/Modality Energy Dose per fraction Total number  of fractions Total dose Start date End date   Pelvis 3D CRT 15 MV 180 cGy 25 4500 cGy 04/18/2018   05/23/2018   Rectum [place] 3D CRT 15 MV 180 cGy 3 540 cGy 05/24/2018 05/26/2018             COMPLETED INTENDED COURSE:  Yes    TREATMENT BREAK > 2 WEEKS:  No    TOLERANCE TO TREATMENT:  Mild toxicities or complications not requiring treatment    FEEDING TUBE:  no    PLAN FOR FOLLOW-UP: Max Stewart is to return for follow up with me/our group in 6 month(s) and for follow up in 1 month with surg onc.      Max Lombard, MD  06/05/18 9:02 AM

## 2018-06-05 NOTE — Unmapped (Signed)
Patient disenrolled from [facility] specialty refill calls for capecitabine as patient is no longer receiving the medication from the [facility].    Care coordination: 5 minutes    Konrad Penta, PharmD, BCOP, CPP  Clinical Pharmacist Practitioner, Gastrointestinal Oncology  Pager: [phone]

## 2018-06-30 ENCOUNTER — Ambulatory Visit
Admit: 2018-06-30 | Discharge: 2018-06-30 | Payer: PRIVATE HEALTH INSURANCE | Attending: Surgical Oncology | Primary: Surgical Oncology

## 2018-06-30 ENCOUNTER — Ambulatory Visit: Admit: 2018-06-30 | Discharge: 2018-06-30 | Payer: PRIVATE HEALTH INSURANCE

## 2018-06-30 DIAGNOSIS — C20 Malignant neoplasm of rectum: Principal | ICD-10-CM

## 2018-06-30 DIAGNOSIS — C787 Secondary malignant neoplasm of liver and intrahepatic bile duct: Secondary | ICD-10-CM

## 2018-09-29 ENCOUNTER — Ambulatory Visit: Admit: 2018-09-29 | Discharge: 2018-09-29 | Payer: PRIVATE HEALTH INSURANCE

## 2018-09-29 ENCOUNTER — Ambulatory Visit
Admit: 2018-09-29 | Discharge: 2018-09-29 | Payer: PRIVATE HEALTH INSURANCE | Attending: Surgical Oncology | Primary: Surgical Oncology

## 2018-09-29 DIAGNOSIS — Z08 Encounter for follow-up examination after completed treatment for malignant neoplasm: Principal | ICD-10-CM

## 2018-09-29 DIAGNOSIS — C20 Malignant neoplasm of rectum: Principal | ICD-10-CM

## 2018-09-29 DIAGNOSIS — Z85048 Personal history of other malignant neoplasm of rectum, rectosigmoid junction, and anus: Secondary | ICD-10-CM

## 2018-10-24 ENCOUNTER — Ambulatory Visit
Admit: 2018-10-24 | Discharge: 2018-11-08 | Payer: PRIVATE HEALTH INSURANCE | Attending: Radiation Oncology | Primary: Radiation Oncology

## 2018-10-24 DIAGNOSIS — C20 Malignant neoplasm of rectum: Secondary | ICD-10-CM

## 2019-01-25 ENCOUNTER — Institutional Professional Consult (permissible substitution): Admit: 2019-01-25 | Discharge: 2019-01-26 | Payer: PRIVATE HEALTH INSURANCE

## 2019-01-25 ENCOUNTER — Ambulatory Visit
Admit: 2019-01-25 | Discharge: 2019-01-26 | Payer: PRIVATE HEALTH INSURANCE | Attending: Hematology & Oncology | Primary: Hematology & Oncology

## 2019-01-25 DIAGNOSIS — Z08 Encounter for follow-up examination after completed treatment for malignant neoplasm: Principal | ICD-10-CM

## 2019-01-25 DIAGNOSIS — Z85048 Personal history of other malignant neoplasm of rectum, rectosigmoid junction, and anus: Secondary | ICD-10-CM

## 2019-01-25 DIAGNOSIS — C20 Malignant neoplasm of rectum: Principal | ICD-10-CM

## 2019-04-02 ENCOUNTER — Ambulatory Visit
Admit: 2019-04-02 | Discharge: 2019-04-03 | Payer: PRIVATE HEALTH INSURANCE | Attending: Surgical Oncology | Primary: Surgical Oncology

## 2019-04-02 ENCOUNTER — Ambulatory Visit: Admit: 2019-04-02 | Discharge: 2019-04-03 | Payer: PRIVATE HEALTH INSURANCE

## 2019-04-02 DIAGNOSIS — C20 Malignant neoplasm of rectum: Principal | ICD-10-CM

## 2019-04-02 DIAGNOSIS — C787 Secondary malignant neoplasm of liver and intrahepatic bile duct: Secondary | ICD-10-CM

## 2019-04-06 DIAGNOSIS — C787 Secondary malignant neoplasm of liver and intrahepatic bile duct: Principal | ICD-10-CM

## 2019-04-06 DIAGNOSIS — C20 Malignant neoplasm of rectum: Principal | ICD-10-CM

## 2019-05-01 ENCOUNTER — Other Ambulatory Visit
Admission: RE | Admit: 2019-05-01 | Discharge: 2019-05-01 | Disposition: A | Discharge status: Home / Self Care | Payer: BC Managed Care – PPO | Source: Ambulatory Visit | Attending: Internal Medicine | Admitting: Internal Medicine

## 2019-05-01 ENCOUNTER — Other Ambulatory Visit: Payer: Self-pay

## 2019-05-01 DIAGNOSIS — Z01812 Encounter for preprocedural laboratory examination: Secondary | ICD-10-CM | POA: Diagnosis present

## 2019-05-01 DIAGNOSIS — Z20822 Contact with and (suspected) exposure to covid-19: Secondary | ICD-10-CM | POA: Insufficient documentation

## 2019-05-01 LAB — SARS CORONAVIRUS 2 (TAT 6-24 HRS): SARS Coronavirus 2: NEGATIVE

## 2019-05-02 ENCOUNTER — Encounter: Payer: Self-pay | Admitting: Internal Medicine

## 2019-05-03 ENCOUNTER — Ambulatory Visit: Payer: BC Managed Care – PPO | Admitting: Anesthesiology

## 2019-05-03 ENCOUNTER — Other Ambulatory Visit: Payer: Self-pay

## 2019-05-03 ENCOUNTER — Encounter
Admission: RE | Disposition: A | Discharge status: Home / Self Care | Payer: Self-pay | Source: Home / Self Care | Attending: Internal Medicine

## 2019-05-03 ENCOUNTER — Ambulatory Visit
Admission: RE | Admit: 2019-05-03 | Discharge: 2019-05-03 | Disposition: A | Discharge status: Home / Self Care | Payer: BC Managed Care – PPO | Attending: Internal Medicine | Admitting: Internal Medicine

## 2019-05-03 ENCOUNTER — Encounter: Payer: Self-pay | Admitting: Internal Medicine

## 2019-05-03 DIAGNOSIS — Z923 Personal history of irradiation: Secondary | ICD-10-CM | POA: Insufficient documentation

## 2019-05-03 DIAGNOSIS — Z8614 Personal history of Methicillin resistant Staphylococcus aureus infection: Secondary | ICD-10-CM | POA: Diagnosis not present

## 2019-05-03 DIAGNOSIS — K640 First degree hemorrhoids: Secondary | ICD-10-CM | POA: Insufficient documentation

## 2019-05-03 DIAGNOSIS — Z9221 Personal history of antineoplastic chemotherapy: Secondary | ICD-10-CM | POA: Insufficient documentation

## 2019-05-03 DIAGNOSIS — E119 Type 2 diabetes mellitus without complications: Secondary | ICD-10-CM | POA: Diagnosis not present

## 2019-05-03 DIAGNOSIS — C20 Malignant neoplasm of rectum: Secondary | ICD-10-CM | POA: Diagnosis not present

## 2019-05-03 DIAGNOSIS — Z79899 Other long term (current) drug therapy: Secondary | ICD-10-CM | POA: Diagnosis not present

## 2019-05-03 DIAGNOSIS — R933 Abnormal findings on diagnostic imaging of other parts of digestive tract: Secondary | ICD-10-CM | POA: Diagnosis present

## 2019-05-03 DIAGNOSIS — Z794 Long term (current) use of insulin: Secondary | ICD-10-CM | POA: Diagnosis not present

## 2019-05-03 HISTORY — PX: COLONOSCOPY WITH PROPOFOL: SHX5780

## 2019-05-03 LAB — GLUCOSE, CAPILLARY: Glucose-Capillary: 144 mg/dL — ABNORMAL HIGH (ref 70–99)

## 2019-05-03 SURGERY — COLONOSCOPY WITH PROPOFOL
Anesthesia: General

## 2019-05-03 MED ORDER — SODIUM CHLORIDE 0.9 % IV SOLN
INTRAVENOUS | Status: DC
  Administered 2019-05-03: 1000 mL via INTRAVENOUS

## 2019-05-03 MED ORDER — PROPOFOL 500 MG/50ML IV EMUL
INTRAVENOUS | Status: AC
  Filled 2019-05-03: qty 50

## 2019-05-03 MED ORDER — MIDAZOLAM HCL 2 MG/2ML IJ SOLN
INTRAMUSCULAR | Status: DC | PRN
  Administered 2019-05-03: 2 mg via INTRAVENOUS

## 2019-05-03 MED ORDER — FENTANYL CITRATE (PF) 100 MCG/2ML IJ SOLN
INTRAMUSCULAR | Status: DC | PRN
  Administered 2019-05-03: 50 ug via INTRAVENOUS

## 2019-05-03 MED ORDER — FENTANYL CITRATE (PF) 100 MCG/2ML IJ SOLN
INTRAMUSCULAR | Status: AC
  Filled 2019-05-03: qty 2

## 2019-05-03 MED ORDER — PROPOFOL 500 MG/50ML IV EMUL
INTRAVENOUS | Status: DC | PRN
  Administered 2019-05-03: 100 ug/kg/min via INTRAVENOUS

## 2019-05-03 MED ORDER — MIDAZOLAM HCL 2 MG/2ML IJ SOLN
INTRAMUSCULAR | Status: AC
  Filled 2019-05-03: qty 2

## 2019-05-03 NOTE — Anesthesia Preprocedure Evaluation (Signed)
Anesthesia Evaluation  Patient identified by MRN, date of birth, ID band Patient awake    Reviewed: Allergy & Precautions, H&P , NPO status , Patient's Chart, lab work & pertinent test results, reviewed documented beta blocker date and time   Airway Mallampati: II   Neck ROM: full    Dental  (+) Poor Dentition   Pulmonary neg pulmonary ROS,    Pulmonary exam normal        Cardiovascular negative cardio ROS Normal cardiovascular exam Rhythm:regular Rate:Normal     Neuro/Psych negative neurological ROS  negative psych ROS   GI/Hepatic negative GI ROS, Neg liver ROS,   Endo/Other  negative endocrine ROSdiabetes, Well Controlled, Type 2  Renal/GU negative Renal ROS  negative genitourinary   Musculoskeletal   Abdominal   Peds  Hematology negative hematology ROS (+)   Anesthesia Other Findings Past Medical History: No date: Diabetes mellitus without complication ([place]) No date: Insulin pump in place 3/16: MRSA (methicillin resistant Staphylococcus aureus) Past Surgical History: 10/12/2017: COLONOSCOPY WITH PROPOFOL; N/A     Comment:  Procedure: COLONOSCOPY WITH PROPOFOL;  Surgeon: [place],               Benay Pike, MD;  Location: [facility] ENDOSCOPY;  Service:               Gastroenterology;  Laterality: N/A; No date: EYE SURGERY 07/20/2014: INCISION AND DRAINAGE PERIRECTAL ABSCESS; N/A     Comment:  Procedure: IRRIGATION AND DEBRIDEMENT PERINEAL ABSCESS;               Surgeon: Marlyce Huge, MD;  Location: [facility] ORS;               Service: General;  Laterality: N/A;   Reproductive/Obstetrics negative OB ROS                             Anesthesia Physical Anesthesia Plan  ASA: III  Anesthesia Plan: General   Post-op Pain Management:    Induction:   PONV Risk Score and Plan:   Airway Management Planned:   Additional Equipment:   Intra-op Plan:   Post-operative Plan:    Informed Consent: I have reviewed the patients History and Physical, chart, labs and discussed the procedure including the risks, benefits and alternatives for the proposed anesthesia with the patient or authorized representative who has indicated his/her understanding and acceptance.     Dental Advisory Given  Plan Discussed with: CRNA  Anesthesia Plan Comments:         Anesthesia Quick Evaluation

## 2019-05-03 NOTE — Op Note (Signed)
[facility] Gastroenterology Patient Name: Nicholas Marsh Procedure Date: 05/03/2019 8:28 AM MRN: [id number] Account #: [id number] Date of Birth: July 19, 1970 Admit Type: Outpatient Age: 49 Room: [facility] ENDO ROOM 3 Gender: Male Note Status: Finalized Procedure:             Colonoscopy Indications:           High risk colon cancer surveillance: Personal history                         of rectal cancer. 49 yo male patient is s/p                         chemoradiation in 2020 for rectal cancer diagnosed in                         Sep 2019. Procedure being done today per request by                         patient's Colorectal surgeon, Dr. Farrel Conners, for                         abnormal MRI (Feb 2021) showing posttreatment lesion,                         mass vs. scar. Providers:             Benay Pike. Alice Reichert MD, MD Referring MD:          Youlanda Roys. Lovie Macadamia, MD (Referring MD) Medicines:             Propofol per Anesthesia Complications:         No immediate complications. Estimated blood loss:                         Minimal. Procedure:             Pre-Anesthesia Assessment:                        - The risks and benefits of the procedure and the                         sedation options and risks were discussed with the                         patient. All questions were answered and informed                         consent was obtained.                        - Patient identification and proposed procedure were                         verified prior to the procedure by the nurse. The                         procedure was verified in the procedure room.                        -  ASA Grade Assessment: III - A patient with severe                         systemic disease.                        - After reviewing the risks and benefits, the patient                         was deemed in satisfactory condition to undergo the                         procedure.   After obtaining informed consent, the colonoscope was                         passed under direct vision. Throughout the procedure,                         the patient's blood pressure, pulse, and oxygen                         saturations were monitored continuously. The                         Colonoscope was introduced through the anus and                         advanced to the the cecum, identified by appendiceal                         orifice and ileocecal valve. The colonoscopy was                         performed without difficulty. The patient tolerated                         the procedure well. The quality of the bowel                         preparation was excellent. The ileocecal valve,                         appendiceal orifice, and rectum were photographed. Findings:      The perianal and digital rectal examinations were normal. Pertinent       negatives include normal sphincter tone.      The digital rectal exam findings include palpable rectal mass.      An infiltrative and ulcerated non-obstructing small mass was found in       the distal rectum. The mass was non-circumferential. The mass measured       three cm in length. In addition, its diameter measured two mm. No       bleeding was present. Biopsies were taken with a cold forceps for       histology.      Non-bleeding internal hemorrhoids were found during retroflexion. The       hemorrhoids were Grade I (internal hemorrhoids that do not prolapse).      The exam was otherwise without abnormality on direct and retroflexion  views. Impression:            - Palpable rectal mass found on digital rectal exam.                        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                         Biopsied.                        - Non-bleeding internal hemorrhoids.                        - The examination was otherwise normal on direct and                         retroflexion views. Recommendation:         - Patient has a contact number available for                         emergencies. The signs and symptoms of potential                         delayed complications were discussed with the patient.                         Return to normal activities tomorrow. Written                         discharge instructions were provided to the patient.                        - Resume previous diet.                        - Continue present medications.                        - Await pathology results.                        - Return to Dr. Audie Clear at [facility] pending results of                         biopsies.                        - The findings and recommendations were discussed with                         the patient. Procedure Code(s):     --- Professional ---                        [phone], Colonoscopy, flexible; with biopsy, single or                         multiple Diagnosis Code(s):     --- Professional ---                        K64.0, First degree hemorrhoids  D49.0, Neoplasm of unspecified behavior of digestive                         system                        K62.89, Other specified diseases of anus and rectum                        Z85.048, Personal history of other malignant neoplasm                         of rectum, rectosigmoid junction, and anus CPT copyright 2019 American Medical Association. All rights reserved. The codes documented in this report are preliminary and upon coder review may  be revised to meet current compliance requirements. Efrain Sella MD, MD 05/03/2019 8:55:10 AM This report has been signed electronically. Number of Addenda: 0 Note Initiated On: 05/03/2019 8:28 AM Scope Withdrawal Time: 0 hours 7 minutes 3 seconds  Total Procedure Duration: 0 hours 14 minutes 54 seconds  Estimated Blood Loss:  Estimated blood loss was minimal.      [facility]

## 2019-05-03 NOTE — Transfer of Care (Signed)
Immediate Anesthesia Transfer of Care Note  Patient: Nicholas Marsh  Procedure(s) Performed: COLONOSCOPY WITH PROPOFOL (N/A )  Patient Location: PACU  Anesthesia Type:General  Level of Consciousness: awake and sedated  Airway & Oxygen Therapy: Patient Spontanous Breathing and Patient connected to nasal cannula oxygen  Post-op Assessment: Report given to RN and Post -op Vital signs reviewed and stable  Post vital signs: Reviewed  Last Vitals:  Vitals Value Taken Time  BP 116/79 05/03/19 0851  Temp    Pulse 73 05/03/19 0851  Resp 17 05/03/19 0851  SpO2 98 % 05/03/19 0851  Vitals shown include unvalidated device data.  Last Pain:  Vitals:   05/03/19 0754  TempSrc: Temporal  PainSc: 0-No pain         Complications: No apparent anesthesia complications

## 2019-05-03 NOTE — Anesthesia Postprocedure Evaluation (Signed)
Anesthesia Post Note  Patient: Nicholas Marsh  Procedure(s) Performed: COLONOSCOPY WITH PROPOFOL (N/A )  Patient location during evaluation: PACU Anesthesia Type: General Level of consciousness: awake and alert Pain management: pain level controlled Vital Signs Assessment: post-procedure vital signs reviewed and stable Respiratory status: spontaneous breathing, nonlabored ventilation, respiratory function stable and patient connected to nasal cannula oxygen Cardiovascular status: blood pressure returned to baseline and stable Postop Assessment: no apparent nausea or vomiting Anesthetic complications: no     Last Vitals:  Vitals:   05/03/19 0900 05/03/19 0910  BP: 105/80 110/83  Pulse: (!) 56 (!) 52  Resp: 16 18  Temp:    SpO2: 100% 100%    Last Pain:  Vitals:   05/03/19 0853  TempSrc: Temporal  PainSc:                  Molli Barrows

## 2019-05-03 NOTE — Interval H&P Note (Signed)
History and Physical Interval Note:  05/03/2019 8:29 AM  Nicholas Marsh  has presented today for surgery, with the diagnosis of RECTAL CANCER (METS TO LIVER).  The various methods of treatment have been discussed with the patient and family. After consideration of risks, benefits and other options for treatment, the patient has consented to  Procedure(s): COLONOSCOPY WITH PROPOFOL (N/A) as a surgical intervention.  The patient's history has been reviewed, patient examined, no change in status, stable for surgery.  I have reviewed the patient's chart and labs.  Questions were answered to the patient's satisfaction.     [place], [place]

## 2019-05-03 NOTE — H&P (Signed)
Outpatient short stay form Pre-procedure 05/03/2019 8:29 AM Atiyana Welte K. Alice Reichert, M.D.  Primary Physician: Juluis Pitch, M.D.  Reason for visit:  Hx of Rectal cancer s/p chemoradiation  History of present illness:  As above. Patient denies change in bowel habits, rectal bleeding, weight loss or abdominal pain.      Current Facility-Administered Medications:  .  0.9 %  sodium chloride infusion, , Intravenous, Continuous, [place], Benay Pike, MD, Last Rate: 20 mL/hr at 05/03/19 0807, 1,000 mL at 05/03/19 [id number]  Medications Prior to Admission  Medication Sig Dispense Refill Last Dose  . citalopram (CELEXA) 40 MG tablet Take 40 mg by mouth daily.   05/02/2019 at Unknown time  . insulin aspart (NOVOLOG) 100 UNIT/ML injection Inject into the skin.   05/02/2019 at Unknown time  . Insulin Human (INSULIN PUMP) SOLN 1 each by Other route continuous. Pt uses Novolog.   05/03/2019 at Unknown time  . losartan (COZAAR) 50 MG tablet Take 50 mg by mouth daily.   05/02/2019 at Unknown time  . simvastatin (ZOCOR) 40 MG tablet Take 40 mg by mouth daily.   05/02/2019 at Unknown time  . BAYER CONTOUR NEXT TEST test strip USE 1 STRIP 6 TIMES A DAY AS INSTRUCTED  9      Allergies  Allergen Reactions  . Ciprofloxacin Other (See Comments)    Reaction:  Unknown   . Shellfish Allergy Hives     Past Medical History:  Diagnosis Date  . Diabetes mellitus without complication ([place])   . Insulin pump in place   . MRSA (methicillin resistant Staphylococcus aureus) 3/16    Review of systems:  Otherwise negative.    Physical Exam  Gen: Alert, oriented. Appears stated age.  HEENT: [place]/AT. PERRLA. Lungs: CTA, no wheezes. CV: RR nl S1, S2. Abd: soft, benign, no masses. BS+ Ext: No edema. Pulses 2+    Planned procedures: Proceed with colonoscopy. The patient understands the nature of the planned procedure, indications, risks, alternatives and potential complications including but not limited to bleeding,  infection, perforation, damage to internal organs and possible oversedation/side effects from anesthesia. The patient agrees and gives consent to proceed.  Please refer to procedure notes for findings, recommendations and patient disposition/instructions.     [name] K. Alice Reichert, M.D. Gastroenterology 05/03/2019  8:29 AM

## 2019-05-04 ENCOUNTER — Encounter: Payer: Self-pay | Admitting: *Deleted

## 2019-05-04 LAB — SURGICAL PATHOLOGY

## 2019-05-18 ENCOUNTER — Other Ambulatory Visit: Admit: 2019-05-18 | Discharge: 2019-05-18 | Payer: PRIVATE HEALTH INSURANCE

## 2019-05-18 ENCOUNTER — Ambulatory Visit
Admit: 2019-05-18 | Discharge: 2019-05-18 | Payer: PRIVATE HEALTH INSURANCE | Attending: Surgical Oncology | Primary: Surgical Oncology

## 2019-05-18 DIAGNOSIS — C20 Malignant neoplasm of rectum: Principal | ICD-10-CM

## 2019-05-18 MED ORDER — NEOMYCIN 500 MG TABLET
ORAL_TABLET | Freq: Three times a day (TID) | ORAL | 0 refills | 1 days | Status: CP
Start: 2019-05-18 — End: 2019-05-19

## 2019-05-18 MED ORDER — BISACODYL 5 MG TABLET,DELAYED RELEASE
ORAL_TABLET | Freq: Once | ORAL | 0 refills | 1.00000 days | Status: CP
Start: 2019-05-18 — End: 2019-05-18

## 2019-05-18 MED ORDER — POLYETHYLENE GLYCOL 3350 17 GRAM ORAL POWDER PACKET
PACK | Freq: Once | ORAL | 0 refills | 1.00000 days | Status: CP
Start: 2019-05-18 — End: 2019-05-18

## 2019-05-19 ENCOUNTER — Ambulatory Visit: Admit: 2019-05-19 | Discharge: 2019-05-20 | Payer: PRIVATE HEALTH INSURANCE | Attending: Family | Primary: Family

## 2019-05-22 ENCOUNTER — Encounter
Admit: 2019-05-22 | Discharge: 2019-05-30 | Disposition: A | Discharge status: Home / Self Care | Payer: PRIVATE HEALTH INSURANCE | Admitting: Surgical Oncology

## 2019-05-22 ENCOUNTER — Ambulatory Visit
Admit: 2019-05-22 | Discharge: 2019-05-30 | Disposition: A | Discharge status: Home / Self Care | Payer: PRIVATE HEALTH INSURANCE | Admitting: Surgical Oncology

## 2019-05-29 MED ORDER — ONDANSETRON 4 MG DISINTEGRATING TABLET
ORAL_TABLET | Freq: Four times a day (QID) | ORAL | 0 refills | 4.00000 days | Status: CP | PRN
Start: 2019-05-29 — End: 2019-06-05
  Filled 2019-05-30: qty 15, 4d supply, fill #0

## 2019-05-29 MED ORDER — OXYCODONE 10 MG TABLET
ORAL_TABLET | Freq: Four times a day (QID) | ORAL | 0 refills | 5.00000 days | Status: CP | PRN
Start: 2019-05-29 — End: 2019-06-03
  Filled 2019-05-30: qty 20, 5d supply, fill #0

## 2019-05-29 MED ORDER — TAMSULOSIN 0.4 MG CAPSULE
ORAL_CAPSULE | Freq: Every evening | ORAL | 0 refills | 7.00 days | Status: CP
Start: 2019-05-29 — End: 2019-06-05
  Filled 2019-05-30: qty 7, 7d supply, fill #0

## 2019-05-29 MED ORDER — NYSTATIN 100,000 UNIT/ML ORAL SUSPENSION
Freq: Four times a day (QID) | ORAL | 0 refills | 3.00 days | Status: CP
Start: 2019-05-29 — End: 2019-06-01
  Filled 2019-05-30: qty 60, 3d supply, fill #0

## 2019-05-29 MED ORDER — OXYCODONE 10 MG TABLET: 10 mg | tablet | Freq: Four times a day (QID) | 0 refills | 5 days | Status: AC

## 2019-05-29 MED ORDER — LOPERAMIDE 2 MG CAPSULE
ORAL_CAPSULE | Freq: Three times a day (TID) | ORAL | 0 refills | 20.00 days | Status: CP | PRN
Start: 2019-05-29 — End: 2019-06-28
  Filled 2019-05-30: qty 60, 20d supply, fill #0

## 2019-05-30 MED FILL — LOPERAMIDE 2 MG CAPSULE: 20 days supply | Qty: 60 | Fill #0 | Status: AC

## 2019-05-30 MED FILL — OXYCODONE 10 MG TABLET: 5 days supply | Qty: 20 | Fill #0 | Status: AC

## 2019-05-30 MED FILL — NYSTATIN 100,000 UNIT/ML ORAL SUSPENSION: 3 days supply | Qty: 60 | Fill #0 | Status: AC

## 2019-05-30 MED FILL — TAMSULOSIN 0.4 MG CAPSULE: 7 days supply | Qty: 7 | Fill #0 | Status: AC

## 2019-05-30 MED FILL — ONDANSETRON 4 MG DISINTEGRATING TABLET: 4 days supply | Qty: 15 | Fill #0 | Status: AC

## 2019-06-04 ENCOUNTER — Ambulatory Visit
Admit: 2019-06-04 | Discharge: 2019-06-10 | Disposition: A | Discharge status: Home / Self Care | Payer: PRIVATE HEALTH INSURANCE | Admitting: Surgical Oncology

## 2019-06-10 MED ORDER — OXYCODONE 5 MG TABLET
ORAL_TABLET | ORAL | 0 refills | 2 days | Status: CP | PRN
Start: 2019-06-10 — End: 2019-06-15
  Filled 2019-06-10: qty 10, 2d supply, fill #0

## 2019-06-10 MED ORDER — TAMSULOSIN 0.4 MG CAPSULE
ORAL_CAPSULE | Freq: Every evening | ORAL | 0 refills | 7 days | Status: CP
Start: 2019-06-10 — End: 2019-06-17
  Filled 2019-06-10: qty 7, 7d supply, fill #0

## 2019-06-10 MED ORDER — AMOXICILLIN 875 MG-POTASSIUM CLAVULANATE 125 MG TABLET
ORAL_TABLET | Freq: Two times a day (BID) | ORAL | 0 refills | 6 days | Status: CP
Start: 2019-06-10 — End: 2019-06-16
  Filled 2019-06-10: qty 12, 6d supply, fill #0

## 2019-06-10 MED ORDER — CYCLOBENZAPRINE 5 MG TABLET
ORAL_TABLET | Freq: Three times a day (TID) | ORAL | 0 refills | 7.00000 days | Status: CP | PRN
Start: 2019-06-10 — End: 2019-06-17
  Filled 2019-06-10: qty 21, 7d supply, fill #0

## 2019-06-10 MED ORDER — NYSTATIN 100,000 UNIT/ML ORAL SUSPENSION
Freq: Four times a day (QID) | ORAL | 0 refills | 3.00000 days | Status: CP
Start: 2019-06-10 — End: 2019-06-13
  Filled 2019-06-10: qty 60, 3d supply, fill #0

## 2019-06-10 MED FILL — CYCLOBENZAPRINE 5 MG TABLET: 7 days supply | Qty: 21 | Fill #0 | Status: AC

## 2019-06-10 MED FILL — TAMSULOSIN 0.4 MG CAPSULE: 7 days supply | Qty: 7 | Fill #0 | Status: AC

## 2019-06-10 MED FILL — NYSTATIN 100,000 UNIT/ML ORAL SUSPENSION: 3 days supply | Qty: 60 | Fill #0 | Status: AC

## 2019-06-10 MED FILL — OXYCODONE 5 MG TABLET: 2 days supply | Qty: 10 | Fill #0 | Status: AC

## 2019-06-10 MED FILL — AMOXICILLIN 875 MG-POTASSIUM CLAVULANATE 125 MG TABLET: 6 days supply | Qty: 12 | Fill #0 | Status: AC

## 2019-06-18 ENCOUNTER — Ambulatory Visit
Admit: 2019-06-18 | Discharge: 2019-06-18 | Payer: PRIVATE HEALTH INSURANCE | Attending: Surgical Oncology | Primary: Surgical Oncology

## 2019-06-18 DIAGNOSIS — C20 Malignant neoplasm of rectum: Principal | ICD-10-CM

## 2019-06-18 DIAGNOSIS — C787 Secondary malignant neoplasm of liver and intrahepatic bile duct: Principal | ICD-10-CM

## 2019-06-18 MED ORDER — OXYCODONE 5 MG TABLET
ORAL_TABLET | Freq: Four times a day (QID) | ORAL | 0 refills | 3.00000 days | Status: CP | PRN
Start: 2019-06-18 — End: ?

## 2019-06-18 MED ORDER — SODIUM CHLORIDE 0.9 % (FLUSH) INJECTION SYRINGE
Freq: Two times a day (BID) | INTRAVENOUS | 0 refills | 10 days | Status: CP
Start: 2019-06-18 — End: ?

## 2019-06-25 ENCOUNTER — Ambulatory Visit
Admit: 2019-06-25 | Discharge: 2019-06-26 | Payer: PRIVATE HEALTH INSURANCE | Attending: Surgical Oncology | Primary: Surgical Oncology

## 2019-06-25 ENCOUNTER — Ambulatory Visit: Admit: 2019-06-25 | Discharge: 2019-06-26 | Payer: PRIVATE HEALTH INSURANCE

## 2019-07-02 ENCOUNTER — Ambulatory Visit
Admit: 2019-07-02 | Discharge: 2019-07-03 | Payer: PRIVATE HEALTH INSURANCE | Attending: Surgical Oncology | Primary: Surgical Oncology

## 2019-07-02 DIAGNOSIS — C20 Malignant neoplasm of rectum: Principal | ICD-10-CM

## 2019-07-05 DIAGNOSIS — C20 Malignant neoplasm of rectum: Principal | ICD-10-CM

## 2019-07-05 DIAGNOSIS — C787 Secondary malignant neoplasm of liver and intrahepatic bile duct: Principal | ICD-10-CM

## 2019-07-06 ENCOUNTER — Other Ambulatory Visit: Admit: 2019-07-06 | Discharge: 2019-07-07 | Payer: PRIVATE HEALTH INSURANCE

## 2019-07-06 ENCOUNTER — Ambulatory Visit: Admit: 2019-07-06 | Discharge: 2019-07-07 | Payer: PRIVATE HEALTH INSURANCE

## 2019-07-06 ENCOUNTER — Ambulatory Visit
Admit: 2019-07-06 | Discharge: 2019-07-07 | Payer: PRIVATE HEALTH INSURANCE | Attending: Surgical Oncology | Primary: Surgical Oncology

## 2019-07-06 DIAGNOSIS — C20 Malignant neoplasm of rectum: Principal | ICD-10-CM

## 2019-07-06 DIAGNOSIS — C787 Secondary malignant neoplasm of liver and intrahepatic bile duct: Principal | ICD-10-CM

## 2019-07-23 ENCOUNTER — Ambulatory Visit
Admit: 2019-07-23 | Discharge: 2019-07-24 | Payer: PRIVATE HEALTH INSURANCE | Attending: Surgical Oncology | Primary: Surgical Oncology

## 2019-07-23 ENCOUNTER — Ambulatory Visit: Admit: 2019-07-23 | Discharge: 2019-07-24 | Payer: PRIVATE HEALTH INSURANCE

## 2019-07-23 DIAGNOSIS — C787 Secondary malignant neoplasm of liver and intrahepatic bile duct: Principal | ICD-10-CM

## 2019-07-26 ENCOUNTER — Ambulatory Visit: Admit: 2019-07-26 | Discharge: 2019-07-26 | Payer: PRIVATE HEALTH INSURANCE

## 2019-07-26 ENCOUNTER — Institutional Professional Consult (permissible substitution): Admit: 2019-07-26 | Discharge: 2019-07-26 | Payer: PRIVATE HEALTH INSURANCE

## 2019-07-26 ENCOUNTER — Ambulatory Visit
Admit: 2019-07-26 | Discharge: 2019-07-26 | Payer: PRIVATE HEALTH INSURANCE | Attending: Hematology & Oncology | Primary: Hematology & Oncology

## 2019-07-26 DIAGNOSIS — C20 Malignant neoplasm of rectum: Principal | ICD-10-CM

## 2019-08-02 ENCOUNTER — Encounter
Admit: 2019-08-02 | Discharge: 2019-08-05 | Disposition: A | Discharge status: Home / Self Care | Payer: PRIVATE HEALTH INSURANCE | Admitting: Surgical Oncology

## 2019-08-02 ENCOUNTER — Ambulatory Visit
Admit: 2019-08-02 | Discharge: 2019-08-05 | Disposition: A | Discharge status: Home / Self Care | Payer: PRIVATE HEALTH INSURANCE | Admitting: Surgical Oncology

## 2019-08-02 MED ORDER — INSULIN ASPART U-100  100 UNIT/ML SUBCUTANEOUS SOLUTION
Freq: Every day | SUBCUTANEOUS | 0 days
Start: 2019-08-02 — End: ?

## 2019-08-05 MED ORDER — OXYCODONE 5 MG TABLET
ORAL_TABLET | ORAL | 0 refills | 2.00000 days | Status: CP | PRN
Start: 2019-08-05 — End: 2019-08-10

## 2019-08-05 MED ORDER — GABAPENTIN 100 MG CAPSULE
ORAL_CAPSULE | Freq: Three times a day (TID) | ORAL | 0 refills | 30.00 days | Status: CP
Start: 2019-08-05 — End: 2019-09-04

## 2019-08-05 MED ORDER — LOPERAMIDE 2 MG CAPSULE
ORAL_CAPSULE | Freq: Four times a day (QID) | ORAL | 0 refills | 8.00 days | Status: CP | PRN
Start: 2019-08-05 — End: 2019-09-04

## 2019-08-07 MED ORDER — CITALOPRAM 40 MG TABLET
Freq: Every day | ORAL | 0 days
Start: 2019-08-07 — End: ?

## 2019-08-07 MED ORDER — LOSARTAN 50 MG TABLET
Freq: Every day | ORAL | 0 days
Start: 2019-08-07 — End: ?

## 2019-08-10 ENCOUNTER — Ambulatory Visit
Admit: 2019-08-10 | Discharge: 2019-08-11 | Payer: PRIVATE HEALTH INSURANCE | Attending: Surgical Oncology | Primary: Surgical Oncology

## 2019-08-16 MED ORDER — ROSUVASTATIN 20 MG TABLET
Freq: Every day | ORAL | 0.00000 days
Start: 2019-08-16 — End: ?

## 2019-10-24 DIAGNOSIS — C20 Malignant neoplasm of rectum: Principal | ICD-10-CM

## 2019-10-25 ENCOUNTER — Institutional Professional Consult (permissible substitution): Admit: 2019-10-25 | Discharge: 2019-10-25 | Payer: PRIVATE HEALTH INSURANCE

## 2019-10-25 ENCOUNTER — Ambulatory Visit
Admit: 2019-10-25 | Discharge: 2019-10-25 | Payer: PRIVATE HEALTH INSURANCE | Attending: Hematology & Oncology | Primary: Hematology & Oncology

## 2019-10-25 ENCOUNTER — Ambulatory Visit: Admit: 2019-10-25 | Discharge: 2019-10-25 | Payer: PRIVATE HEALTH INSURANCE

## 2019-10-25 DIAGNOSIS — C20 Malignant neoplasm of rectum: Principal | ICD-10-CM

## 2019-10-25 DIAGNOSIS — C787 Secondary malignant neoplasm of liver and intrahepatic bile duct: Principal | ICD-10-CM

## 2019-10-25 MED ORDER — TADALAFIL 2.5 MG TABLET
ORAL_TABLET | Freq: Every day | ORAL | 1 refills | 0.00000 days | Status: CP
Start: 2019-10-25 — End: ?

## 2019-11-13 DIAGNOSIS — C20 Malignant neoplasm of rectum: Principal | ICD-10-CM

## 2019-11-13 DIAGNOSIS — N529 Male erectile dysfunction, unspecified: Principal | ICD-10-CM

## 2019-11-14 MED ORDER — TADALAFIL 2.5 MG TABLET
ORAL_TABLET | Freq: Every day | ORAL | 1 refills | 0.00000 days | Status: CP
Start: 2019-11-14 — End: ?

## 2020-01-15 MED ORDER — TADALAFIL 2.5 MG TABLET
ORAL_TABLET | Freq: Every day | ORAL | 1 refills | 0.00000 days | Status: CP
Start: 2020-01-15 — End: 2020-03-25

## 2020-01-23 DIAGNOSIS — C787 Secondary malignant neoplasm of liver and intrahepatic bile duct: Principal | ICD-10-CM

## 2020-01-23 DIAGNOSIS — C20 Malignant neoplasm of rectum: Principal | ICD-10-CM

## 2020-01-24 ENCOUNTER — Institutional Professional Consult (permissible substitution): Admit: 2020-01-24 | Discharge: 2020-01-25 | Payer: PRIVATE HEALTH INSURANCE

## 2020-01-24 ENCOUNTER — Ambulatory Visit: Admit: 2020-01-24 | Discharge: 2020-01-25 | Payer: PRIVATE HEALTH INSURANCE

## 2020-01-24 DIAGNOSIS — Z8616 Personal history of COVID-19: Principal | ICD-10-CM

## 2020-01-24 DIAGNOSIS — Z08 Encounter for follow-up examination after completed treatment for malignant neoplasm: Principal | ICD-10-CM

## 2020-01-24 DIAGNOSIS — Z8505 Personal history of malignant neoplasm of liver: Principal | ICD-10-CM

## 2020-01-24 DIAGNOSIS — C787 Secondary malignant neoplasm of liver and intrahepatic bile duct: Principal | ICD-10-CM

## 2020-01-24 DIAGNOSIS — E109 Type 1 diabetes mellitus without complications: Principal | ICD-10-CM

## 2020-01-24 DIAGNOSIS — C20 Malignant neoplasm of rectum: Principal | ICD-10-CM

## 2020-01-24 DIAGNOSIS — Z85048 Personal history of other malignant neoplasm of rectum, rectosigmoid junction, and anus: Principal | ICD-10-CM

## 2020-01-24 DIAGNOSIS — N5239 Other post-surgical erectile dysfunction: Principal | ICD-10-CM

## 2020-01-28 DIAGNOSIS — C787 Secondary malignant neoplasm of liver and intrahepatic bile duct: Principal | ICD-10-CM

## 2020-02-15 MED ORDER — LOPERAMIDE 2 MG CAPSULE
ORAL_CAPSULE | Freq: Four times a day (QID) | ORAL | 0 refills | 8 days | Status: CP | PRN
Start: 2020-02-15 — End: 2020-02-21

## 2020-02-20 ENCOUNTER — Ambulatory Visit: Admit: 2020-02-20 | Discharge: 2020-02-21 | Payer: PRIVATE HEALTH INSURANCE

## 2020-02-20 DIAGNOSIS — C787 Secondary malignant neoplasm of liver and intrahepatic bile duct: Principal | ICD-10-CM

## 2020-02-21 ENCOUNTER — Ambulatory Visit
Admit: 2020-02-21 | Discharge: 2020-02-22 | Payer: PRIVATE HEALTH INSURANCE | Attending: Hematology & Oncology | Primary: Hematology & Oncology

## 2020-02-21 ENCOUNTER — Institutional Professional Consult (permissible substitution): Admit: 2020-02-21 | Discharge: 2020-02-22 | Payer: PRIVATE HEALTH INSURANCE

## 2020-02-21 DIAGNOSIS — C787 Secondary malignant neoplasm of liver and intrahepatic bile duct: Principal | ICD-10-CM

## 2020-02-21 DIAGNOSIS — C20 Malignant neoplasm of rectum: Principal | ICD-10-CM

## 2020-02-21 DIAGNOSIS — Z6832 Body mass index (BMI) 32.0-32.9, adult: Principal | ICD-10-CM

## 2020-02-21 MED ORDER — LOPERAMIDE 2 MG CAPSULE
ORAL_CAPSULE | Freq: Four times a day (QID) | ORAL | 11 refills | 15 days | Status: CP | PRN
Start: 2020-02-21 — End: ?

## 2020-02-29 ENCOUNTER — Ambulatory Visit
Admit: 2020-02-29 | Discharge: 2020-03-01 | Payer: PRIVATE HEALTH INSURANCE | Attending: Surgical Oncology | Primary: Surgical Oncology

## 2020-02-29 DIAGNOSIS — C787 Secondary malignant neoplasm of liver and intrahepatic bile duct: Principal | ICD-10-CM

## 2020-03-25 MED ORDER — TADALAFIL 2.5 MG TABLET
ORAL_TABLET | Freq: Every day | ORAL | 4 refills | 0.00000 days | Status: CP
Start: 2020-03-25 — End: ?

## 2020-04-11 ENCOUNTER — Ambulatory Visit
Admit: 2020-04-11 | Discharge: 2020-04-12 | Payer: PRIVATE HEALTH INSURANCE | Attending: Physician Assistant | Primary: Physician Assistant

## 2020-04-14 ENCOUNTER — Encounter
Admit: 2020-04-14 | Discharge: 2020-04-15 | Disposition: A | Discharge status: Home / Self Care | Payer: PRIVATE HEALTH INSURANCE | Admitting: Surgical Oncology

## 2020-04-14 ENCOUNTER — Ambulatory Visit
Admit: 2020-04-14 | Discharge: 2020-04-15 | Disposition: A | Discharge status: Home / Self Care | Payer: MEDICARE | Admitting: Surgical Oncology

## 2020-04-15 MED ORDER — OXYCODONE 5 MG TABLET
ORAL_TABLET | ORAL | 0 refills | 2.00 days | Status: CP | PRN
Start: 2020-04-15 — End: 2020-04-20
  Filled 2020-04-15: qty 10, 2d supply, fill #0

## 2020-04-15 MED ORDER — ACETAMINOPHEN 325 MG TABLET
Freq: Four times a day (QID) | ORAL | 0 refills | 0.00 days
Start: 2020-04-15 — End: ?

## 2020-04-16 MED ORDER — POLYETHYLENE GLYCOL 3350 17 GRAM ORAL POWDER PACKET
PACK | Freq: Every day | ORAL | 0 refills | 30.00 days
Start: 2020-04-16 — End: 2020-05-16

## 2020-04-30 ENCOUNTER — Encounter: Admit: 2020-04-30 | Discharge: 2020-05-01 | Payer: PRIVATE HEALTH INSURANCE

## 2020-04-30 DIAGNOSIS — C787 Secondary malignant neoplasm of liver and intrahepatic bile duct: Principal | ICD-10-CM

## 2020-04-30 DIAGNOSIS — C20 Malignant neoplasm of rectum: Principal | ICD-10-CM

## 2020-05-15 ENCOUNTER — Encounter
Admit: 2020-05-15 | Discharge: 2020-05-16 | Payer: PRIVATE HEALTH INSURANCE | Attending: Hematology & Oncology | Primary: Hematology & Oncology

## 2020-05-15 DIAGNOSIS — C20 Malignant neoplasm of rectum: Principal | ICD-10-CM

## 2020-05-15 DIAGNOSIS — C787 Secondary malignant neoplasm of liver and intrahepatic bile duct: Principal | ICD-10-CM

## 2020-08-07 ENCOUNTER — Ambulatory Visit: Admit: 2020-08-07 | Discharge: 2020-08-07 | Payer: PRIVATE HEALTH INSURANCE

## 2020-08-07 ENCOUNTER — Institutional Professional Consult (permissible substitution): Admit: 2020-08-07 | Discharge: 2020-08-07 | Payer: PRIVATE HEALTH INSURANCE

## 2020-08-07 ENCOUNTER — Ambulatory Visit
Admit: 2020-08-07 | Discharge: 2020-08-07 | Payer: PRIVATE HEALTH INSURANCE | Attending: Hematology & Oncology | Primary: Hematology & Oncology

## 2020-08-07 DIAGNOSIS — C20 Malignant neoplasm of rectum: Principal | ICD-10-CM

## 2020-08-07 DIAGNOSIS — G8929 Other chronic pain: Principal | ICD-10-CM

## 2020-08-07 DIAGNOSIS — C787 Secondary malignant neoplasm of liver and intrahepatic bile duct: Principal | ICD-10-CM

## 2020-08-07 DIAGNOSIS — M545 Chronic bilateral low back pain without sciatica: Principal | ICD-10-CM

## 2020-08-07 MED ORDER — TADALAFIL 5 MG TABLET
ORAL_TABLET | Freq: Every day | ORAL | 11 refills | 30 days | Status: CP | PRN
Start: 2020-08-07 — End: 2020-09-06

## 2020-09-09 ENCOUNTER — Ambulatory Visit: Admit: 2020-09-09 | Discharge: 2020-09-10 | Payer: PRIVATE HEALTH INSURANCE

## 2020-09-09 DIAGNOSIS — G8929 Other chronic pain: Principal | ICD-10-CM

## 2020-09-09 DIAGNOSIS — M545 Chronic bilateral low back pain without sciatica: Principal | ICD-10-CM

## 2020-10-21 ENCOUNTER — Institutional Professional Consult (permissible substitution): Admit: 2020-10-21 | Discharge: 2020-10-22 | Payer: PRIVATE HEALTH INSURANCE

## 2020-11-27 ENCOUNTER — Ambulatory Visit: Admit: 2020-11-27 | Discharge: 2020-11-28 | Payer: PRIVATE HEALTH INSURANCE

## 2020-11-27 ENCOUNTER — Ambulatory Visit
Admit: 2020-11-27 | Discharge: 2020-11-28 | Payer: PRIVATE HEALTH INSURANCE | Attending: Hematology & Oncology | Primary: Hematology & Oncology

## 2020-11-27 ENCOUNTER — Institutional Professional Consult (permissible substitution): Admit: 2020-11-27 | Discharge: 2020-11-28 | Payer: PRIVATE HEALTH INSURANCE

## 2020-11-27 DIAGNOSIS — C20 Malignant neoplasm of rectum: Principal | ICD-10-CM

## 2020-11-27 MED ORDER — CITALOPRAM 40 MG TABLET
ORAL_TABLET | Freq: Every day | ORAL | 3 refills | 90 days | Status: CP
Start: 2020-11-27 — End: ?

## 2020-12-02 ENCOUNTER — Ambulatory Visit: Admit: 2020-12-02 | Discharge: 2020-12-03 | Payer: PRIVATE HEALTH INSURANCE

## 2020-12-09 ENCOUNTER — Ambulatory Visit: Admit: 2020-12-09 | Payer: PRIVATE HEALTH INSURANCE | Attending: Radiation Oncology | Primary: Radiation Oncology

## 2020-12-09 DIAGNOSIS — C787 Secondary malignant neoplasm of liver and intrahepatic bile duct: Principal | ICD-10-CM

## 2020-12-12 ENCOUNTER — Ambulatory Visit: Admit: 2020-12-12 | Payer: PRIVATE HEALTH INSURANCE

## 2020-12-23 ENCOUNTER — Telehealth: Admit: 2020-12-23 | Discharge: 2020-12-24 | Payer: PRIVATE HEALTH INSURANCE

## 2020-12-24 ENCOUNTER — Ambulatory Visit: Admit: 2020-12-24 | Discharge: 2020-12-24 | Payer: PRIVATE HEALTH INSURANCE

## 2020-12-24 DIAGNOSIS — C787 Secondary malignant neoplasm of liver and intrahepatic bile duct: Principal | ICD-10-CM

## 2021-01-06 ENCOUNTER — Ambulatory Visit: Admit: 2021-01-06 | Discharge: 2021-01-06 | Payer: PRIVATE HEALTH INSURANCE

## 2021-01-07 ENCOUNTER — Ambulatory Visit
Admit: 2021-01-07 | Discharge: 2021-01-07 | Payer: PRIVATE HEALTH INSURANCE | Attending: Radiation Oncology | Primary: Radiation Oncology

## 2021-01-08 ENCOUNTER — Ambulatory Visit
Admit: 2021-01-08 | Discharge: 2021-01-31 | Payer: PRIVATE HEALTH INSURANCE | Attending: Radiation Oncology | Primary: Radiation Oncology

## 2021-01-08 ENCOUNTER — Ambulatory Visit: Admit: 2021-01-08 | Discharge: 2021-01-27 | Payer: PRIVATE HEALTH INSURANCE

## 2021-01-08 ENCOUNTER — Ambulatory Visit
Admit: 2021-01-08 | Discharge: 2021-01-28 | Payer: PRIVATE HEALTH INSURANCE | Attending: Radiation Oncology | Primary: Radiation Oncology

## 2021-01-08 ENCOUNTER — Ambulatory Visit
Admit: 2021-01-08 | Discharge: 2021-01-30 | Payer: PRIVATE HEALTH INSURANCE | Attending: Radiation Oncology | Primary: Radiation Oncology

## 2021-01-08 ENCOUNTER — Ambulatory Visit
Admit: 2021-01-08 | Discharge: 2021-01-29 | Payer: PRIVATE HEALTH INSURANCE | Attending: Radiation Oncology | Primary: Radiation Oncology

## 2021-01-26 ENCOUNTER — Ambulatory Visit: Payer: BC Managed Care – PPO | Admitting: Dermatology

## 2021-01-28 ENCOUNTER — Ambulatory Visit: Payer: BC Managed Care – PPO | Admitting: Dermatology

## 2021-02-04 ENCOUNTER — Other Ambulatory Visit: Payer: Self-pay

## 2021-02-04 ENCOUNTER — Ambulatory Visit (INDEPENDENT_AMBULATORY_CARE_PROVIDER_SITE_OTHER): Payer: BC Managed Care – PPO | Admitting: Dermatology

## 2021-02-04 DIAGNOSIS — L821 Other seborrheic keratosis: Secondary | ICD-10-CM

## 2021-02-04 DIAGNOSIS — Z1283 Encounter for screening for malignant neoplasm of skin: Secondary | ICD-10-CM | POA: Diagnosis not present

## 2021-02-04 DIAGNOSIS — L578 Other skin changes due to chronic exposure to nonionizing radiation: Secondary | ICD-10-CM | POA: Diagnosis not present

## 2021-02-04 DIAGNOSIS — L814 Other melanin hyperpigmentation: Secondary | ICD-10-CM

## 2021-02-04 DIAGNOSIS — D229 Melanocytic nevi, unspecified: Secondary | ICD-10-CM

## 2021-02-04 DIAGNOSIS — Z840 Family history of diseases of the skin and subcutaneous tissue: Secondary | ICD-10-CM

## 2021-02-04 DIAGNOSIS — D1800 Hemangioma unspecified site: Secondary | ICD-10-CM

## 2021-02-04 NOTE — Progress Notes (Signed)
° °  New Patient Visit  Subjective  Nicholas Marsh is a 50 y.o. male who presents for the following: FBSE (Patient here for full body skin exam and skin cancer screening. Patient with no hx of skin cancer. No fhx skin cancer. Patient's daughter has been seen in our office and did have a moderate to severe dysplastic nevus excised. ).  The following portions of the chart were reviewed this encounter and updated as appropriate:   Tobacco   Allergies   Meds   Problems   Med Hx   Surg Hx   Fam Hx       Review of Systems:  No other skin or systemic complaints except as noted in HPI or Assessment and Plan.  Objective  Well appearing patient in no apparent distress; mood and affect are within normal limits.  A full examination was performed including scalp, head, eyes, ears, nose, lips, neck, chest, axillae, abdomen, back, buttocks, bilateral upper extremities, bilateral lower extremities, hands, feet, fingers, toes, fingernails, and toenails. All findings within normal limits unless otherwise noted below.   Assessment & Plan   Lentigines - Scattered tan macules - Due to sun exposure - Benign-appearing, observe - Recommend daily broad spectrum sunscreen SPF 30+ to sun-exposed areas, reapply every 2 hours as needed. - Call for any changes  Seborrheic Keratoses - Stuck-on, waxy, tan-brown papules and/or plaques  - Benign-appearing - Discussed benign etiology and prognosis. - Observe - Call for any changes  Melanocytic Nevi - Tan-brown and/or pink-flesh-colored symmetric macules and papules - Benign appearing on exam today - Observation - Call clinic for new or changing moles - Recommend daily use of broad spectrum spf 30+ sunscreen to sun-exposed areas.   Hemangiomas - Red papules - Discussed benign nature - Observe - Call for any changes  Actinic Damage - Chronic condition, secondary to cumulative UV/sun exposure - diffuse scaly erythematous macules with underlying  dyspigmentation - Recommend daily broad spectrum sunscreen SPF 30+ to sun-exposed areas, reapply every 2 hours as needed.  - Staying in the shade or wearing long sleeves, sun glasses (UVA+UVB protection) and wide brim hats (4-inch brim around the entire circumference of the hat) are also recommended for sun protection.  - Call for new or changing lesions.  Skin cancer screening performed today.  Return for TBSE 2 years.  Graciella Belton, RMA, am acting as scribe for Forest Gleason, MD .  Documentation: I have reviewed the above documentation for accuracy and completeness, and I agree with the above.  Forest Gleason, MD

## 2021-02-04 NOTE — Patient Instructions (Signed)
Recommend taking Heliocare sun protection supplement daily in sunny weather for additional sun protection. For maximum protection on the sunniest days, you can take up to 2 capsules of regular Heliocare OR take 1 capsule of Heliocare Ultra. For prolonged exposure (such as a full day in the sun), you can repeat your dose of the supplement 4 hours after your first dose. Heliocare can be purchased at [facility] or at [email or web address].    Melanoma ABCDEs  Melanoma is the most dangerous type of skin cancer, and is the leading cause of death from skin disease.  You are more likely to develop melanoma if you: Have light-colored skin, light-colored eyes, or red or blond hair Spend a lot of time in the sun Tan regularly, either outdoors or in a tanning bed Have had blistering sunburns, especially during childhood Have a close family member who has had a melanoma Have atypical moles or large birthmarks  Early detection of melanoma is key since treatment is typically straightforward and cure rates are extremely high if we catch it early.   The first sign of melanoma is often a change in a mole or a new dark spot.  The ABCDE system is a way of remembering the signs of melanoma.  A for asymmetry:  The two halves do not match. B for border:  The edges of the growth are irregular. C for color:  A mixture of colors are present instead of an even brown color. D for diameter:  Melanomas are usually (but not always) greater than 34mm - the size of a pencil eraser. E for evolution:  The spot keeps changing in size, shape, and color.  Please check your skin once per month between visits. You can use a small mirror in front and a large mirror behind you to keep an eye on the back side or your body.   If you see any new or changing lesions before your next follow-up, please call to schedule a visit.  Please continue daily skin protection including broad spectrum sunscreen SPF 30+ to sun-exposed areas,  reapplying every 2 hours as needed when you're outdoors.    If You Need Anything After Your Visit  If you have any questions or concerns for your doctor, please call our main line at [phone] and press option 4 to reach your doctor's medical assistant. If no one answers, please leave a voicemail as directed and we will return your call as soon as possible. Messages left after 4 pm will be answered the following business day.   You may also send [place] a message via [place]. We typically respond to MyChart messages within 1-2 business days.  For prescription refills, please ask your pharmacy to contact our office. Our fax number is [phone].  If you have an urgent issue when the clinic is closed that cannot wait until the next business day, you can page your doctor at the number below.    Please note that while we do our best to be available for urgent issues outside of office hours, we are not available 24/7.   If you have an urgent issue and are unable to reach [place], you may choose to seek medical care at your doctor's office, retail clinic, urgent care center, or emergency room.  If you have a medical emergency, please immediately call 911 or go to the emergency department.  Pager Numbers  - Dr. Nehemiah Massed: [phone]  - Dr. Laurence Ferrari: [phone]  - Dr. Nicole Kindred: [phone]  In the event  of inclement weather, please call our main line at [phone] for an update on the status of any delays or closures.  Dermatology Medication Tips: Please keep the boxes that topical medications come in in order to help keep track of the instructions about where and how to use these. Pharmacies typically print the medication instructions only on the boxes and not directly on the medication tubes.   If your medication is too expensive, please contact our office at [phone] option 4 or send [place] a message through [place].   We are unable to tell what your co-pay for medications will be in advance  as this is different depending on your insurance coverage. However, we may be able to find a substitute medication at lower cost or fill out paperwork to get insurance to cover a needed medication.   If a prior authorization is required to get your medication covered by your insurance company, please allow [place] 1-2 business days to complete this process.  Drug prices often vary depending on where the prescription is filled and some pharmacies may offer cheaper prices.  The website www.goodrx.com contains coupons for medications through different pharmacies. The prices here do not account for what the cost may be with help from insurance (it may be cheaper with your insurance), but the website can give you the price if you did not use any insurance.  - You can print the associated coupon and take it with your prescription to the pharmacy.  - You may also stop by our office during regular business hours and pick up a GoodRx coupon card.  - If you need your prescription sent electronically to a different pharmacy, notify our office through [facility] or by phone at [phone] option 4.     Si Usted Necesita Algo Despus de Su Visita  Tambin puede enviarnos un mensaje a travs de Pharmacist, community. Por lo general respondemos a los mensajes de MyChart en el transcurso de 1 a 2 das hbiles.  Para renovar recetas, por favor pida a su farmacia que se ponga en contacto con nuestra oficina. Harland Dingwall de fax es [place] [phone].  Si tiene un asunto urgente cuando la clnica est cerrada y que no puede esperar hasta el siguiente da hbil, puede llamar/localizar a su doctor(a) al nmero que aparece a continuacin.   Por favor, tenga en cuenta que aunque hacemos todo lo posible para estar disponibles para asuntos urgentes fuera del horario de [place], no estamos disponibles las 24 horas del da, los 7 das de la [place].   Si tiene un problema urgente y no puede comunicarse con nosotros, puede optar  por buscar atencin mdica  en el consultorio de su doctor(a), en una clnica privada, en un centro de atencin urgente o en una sala de emergencias.  Si tiene Engineering geologist, por favor llame inmediatamente al 911 o vaya a la sala de emergencias.  Nmeros de bper  - Dr. Nehemiah Massed: [phone]  - Dra. Moye: [phone]  - Dra. Nicole Kindred: [phone]  En caso de inclemencias del [place], por favor llame a Johnsie Kindred principal al [phone] para una actualizacin sobre el [place] de cualquier retraso o cierre.  Consejos para la medicacin en dermatologa: Por favor, guarde las cajas en las que vienen los medicamentos de uso tpico para ayudarle a seguir las instrucciones sobre dnde y cmo usarlos. Las farmacias generalmente imprimen las instrucciones del medicamento slo en las cajas y no directamente en los tubos del [place].   Si su medicamento  es muy caro, por favor, pngase en contacto con Zigmund Daniel llamando al [phone] y presione la opcin 4 o envenos un mensaje a travs de Pharmacist, community.   No podemos decirle cul ser su copago por los medicamentos por adelantado ya que esto es diferente dependiendo de la cobertura de su seguro. Sin embargo, es posible que podamos encontrar un medicamento sustituto a Electrical engineer un formulario para que el seguro cubra el medicamento que se considera necesario.   Si se requiere una autorizacin previa para que su compaa de seguros [place] su medicamento, por favor permtanos de 1 a 2 das hbiles para completar este proceso.  Los precios de los medicamentos varan con frecuencia dependiendo del Environmental consultant de dnde se surte la receta y alguna farmacias pueden ofrecer precios ms baratos.  El sitio web www.goodrx.com tiene cupones para medicamentos de Airline pilot. Los precios aqu no tienen en cuenta lo que podra costar con la ayuda del seguro (puede ser ms barato con su seguro), pero el sitio web puede darle el precio si  no utiliz Research scientist (physical sciences).  - Puede imprimir el cupn correspondiente y llevarlo con su receta a la farmacia.  - Tambin puede pasar por nuestra oficina durante el horario de atencin regular y Charity fundraiser una tarjeta de cupones de GoodRx.  - Si necesita que su receta se enve electrnicamente a una farmacia diferente, informe a nuestra oficina a travs de MyChart de [place] o por telfono llamando al [phone] y presione la opcin 4.

## 2021-02-05 ENCOUNTER — Encounter: Payer: Self-pay | Admitting: Dermatology

## 2021-02-19 ENCOUNTER — Ambulatory Visit
Admit: 2021-02-19 | Discharge: 2021-02-19 | Payer: PRIVATE HEALTH INSURANCE | Attending: Hematology & Oncology | Primary: Hematology & Oncology

## 2021-02-19 ENCOUNTER — Ambulatory Visit: Admit: 2021-02-19 | Discharge: 2021-02-19 | Payer: PRIVATE HEALTH INSURANCE

## 2021-02-19 ENCOUNTER — Ambulatory Visit: Admit: 2021-02-19 | Payer: PRIVATE HEALTH INSURANCE | Attending: Radiation Oncology | Primary: Radiation Oncology

## 2021-02-19 ENCOUNTER — Institutional Professional Consult (permissible substitution): Admit: 2021-02-19 | Discharge: 2021-02-19 | Payer: PRIVATE HEALTH INSURANCE

## 2021-02-19 DIAGNOSIS — E119 Type 2 diabetes mellitus without complications: Principal | ICD-10-CM

## 2021-02-19 DIAGNOSIS — C19 Malignant neoplasm of rectosigmoid junction: Principal | ICD-10-CM

## 2021-02-19 DIAGNOSIS — Z9221 Personal history of antineoplastic chemotherapy: Principal | ICD-10-CM

## 2021-02-19 DIAGNOSIS — C20 Malignant neoplasm of rectum: Principal | ICD-10-CM

## 2021-02-19 DIAGNOSIS — C787 Secondary malignant neoplasm of liver and intrahepatic bile duct: Principal | ICD-10-CM

## 2021-03-13 ENCOUNTER — Ambulatory Visit: Admit: 2021-03-13 | Discharge: 2021-03-13 | Payer: PRIVATE HEALTH INSURANCE

## 2021-03-13 ENCOUNTER — Ambulatory Visit
Admit: 2021-03-13 | Discharge: 2021-03-13 | Payer: PRIVATE HEALTH INSURANCE | Attending: Physical Medicine & Rehabilitation | Primary: Physical Medicine & Rehabilitation

## 2021-03-13 DIAGNOSIS — M47817 Spondylosis without myelopathy or radiculopathy, lumbosacral region: Principal | ICD-10-CM

## 2021-03-13 MED ORDER — LOPERAMIDE 2 MG CAPSULE
ORAL_CAPSULE | Freq: Four times a day (QID) | ORAL | 1 refills | 0.00000 days | PRN
Start: 2021-03-13 — End: ?

## 2021-03-16 MED ORDER — LOPERAMIDE 2 MG CAPSULE
ORAL_CAPSULE | Freq: Four times a day (QID) | ORAL | 1 refills | 90 days | Status: CP | PRN
Start: 2021-03-16 — End: ?

## 2021-03-19 MED ORDER — LOPERAMIDE 2 MG CAPSULE
ORAL_CAPSULE | Freq: Four times a day (QID) | ORAL | 11 refills | 15 days | Status: CP | PRN
Start: 2021-03-19 — End: ?

## 2021-04-20 ENCOUNTER — Ambulatory Visit: Admit: 2021-04-20 | Discharge: 2021-04-21 | Payer: PRIVATE HEALTH INSURANCE

## 2021-04-20 DIAGNOSIS — N529 Male erectile dysfunction, unspecified: Principal | ICD-10-CM

## 2021-04-20 MED ORDER — SILDENAFIL 100 MG TABLET
ORAL_TABLET | Freq: Every day | ORAL | 3 refills | 90.00000 days | Status: CP | PRN
Start: 2021-04-20 — End: 2021-04-20

## 2021-05-21 ENCOUNTER — Ambulatory Visit: Admit: 2021-05-21 | Payer: PRIVATE HEALTH INSURANCE | Attending: Radiation Oncology | Primary: Radiation Oncology

## 2021-05-21 ENCOUNTER — Ambulatory Visit: Admit: 2021-05-21 | Discharge: 2021-05-21 | Payer: PRIVATE HEALTH INSURANCE

## 2021-05-21 ENCOUNTER — Ambulatory Visit
Admit: 2021-05-21 | Discharge: 2021-05-21 | Payer: PRIVATE HEALTH INSURANCE | Attending: Hematology & Oncology | Primary: Hematology & Oncology

## 2021-05-21 DIAGNOSIS — C19 Malignant neoplasm of rectosigmoid junction: Principal | ICD-10-CM

## 2021-05-21 DIAGNOSIS — C20 Malignant neoplasm of rectum: Principal | ICD-10-CM

## 2021-05-21 DIAGNOSIS — E119 Type 2 diabetes mellitus without complications: Principal | ICD-10-CM

## 2021-05-21 DIAGNOSIS — Z9221 Personal history of antineoplastic chemotherapy: Principal | ICD-10-CM

## 2021-05-21 DIAGNOSIS — C787 Secondary malignant neoplasm of liver and intrahepatic bile duct: Principal | ICD-10-CM

## 2021-05-29 MED ORDER — PEG 3350-ELECTROLYTES 236 GRAM-22.74 GRAM-6.74 GRAM-5.86 GRAM SOLUTION
ORAL | 0 refills | 0 days | Status: CP
Start: 2021-05-29 — End: ?

## 2021-06-05 MED ORDER — SOD PICOSULF 10 MG-MAGNES 3.5 GRAM-CITRIC 12 GRAM/160 ML ORAL SOLUTION
ORAL | 0 refills | 0 days | Status: CP
Start: 2021-06-05 — End: ?

## 2021-06-08 ENCOUNTER — Ambulatory Visit: Admit: 2021-06-08 | Discharge: 2021-06-08 | Payer: PRIVATE HEALTH INSURANCE

## 2021-06-08 ENCOUNTER — Encounter
Admit: 2021-06-08 | Discharge: 2021-06-08 | Payer: PRIVATE HEALTH INSURANCE | Attending: Certified Registered" | Primary: Certified Registered"

## 2021-06-09 DIAGNOSIS — C20 Malignant neoplasm of rectum: Principal | ICD-10-CM

## 2021-06-09 MED ORDER — PROCHLORPERAZINE MALEATE 10 MG TABLET
ORAL_TABLET | Freq: Four times a day (QID) | ORAL | 2 refills | 8 days | Status: CP | PRN
Start: 2021-06-09 — End: ?

## 2021-06-09 MED ORDER — ONDANSETRON HCL 8 MG TABLET
ORAL_TABLET | Freq: Three times a day (TID) | ORAL | 2 refills | 10 days | Status: CP | PRN
Start: 2021-06-09 — End: ?

## 2021-06-17 ENCOUNTER — Ambulatory Visit
Admit: 2021-06-17 | Discharge: 2021-06-18 | Payer: PRIVATE HEALTH INSURANCE | Attending: Surgical Oncology | Primary: Surgical Oncology

## 2021-06-17 DIAGNOSIS — C20 Malignant neoplasm of rectum: Principal | ICD-10-CM

## 2021-06-25 ENCOUNTER — Ambulatory Visit: Admit: 2021-06-25 | Discharge: 2021-06-26 | Payer: PRIVATE HEALTH INSURANCE

## 2021-06-25 DIAGNOSIS — C787 Secondary malignant neoplasm of liver and intrahepatic bile duct: Principal | ICD-10-CM

## 2021-06-25 DIAGNOSIS — C20 Malignant neoplasm of rectum: Principal | ICD-10-CM

## 2021-06-29 DIAGNOSIS — C20 Malignant neoplasm of rectum: Principal | ICD-10-CM

## 2021-07-09 ENCOUNTER — Institutional Professional Consult (permissible substitution): Admit: 2021-07-09 | Discharge: 2021-07-10 | Payer: PRIVATE HEALTH INSURANCE

## 2021-07-09 ENCOUNTER — Ambulatory Visit: Admit: 2021-07-09 | Discharge: 2021-07-10 | Payer: PRIVATE HEALTH INSURANCE

## 2021-07-09 DIAGNOSIS — Z09 Encounter for follow-up examination after completed treatment for conditions other than malignant neoplasm: Principal | ICD-10-CM

## 2021-07-09 DIAGNOSIS — C787 Secondary malignant neoplasm of liver and intrahepatic bile duct: Principal | ICD-10-CM

## 2021-07-09 DIAGNOSIS — C20 Malignant neoplasm of rectum: Principal | ICD-10-CM

## 2021-07-09 DIAGNOSIS — R110 Nausea: Principal | ICD-10-CM

## 2021-07-09 DIAGNOSIS — T451X5A Adverse effect of antineoplastic and immunosuppressive drugs, initial encounter: Principal | ICD-10-CM

## 2021-07-09 DIAGNOSIS — E109 Type 1 diabetes mellitus without complications: Principal | ICD-10-CM

## 2021-07-09 MED ORDER — ONDANSETRON HCL 8 MG TABLET
ORAL_TABLET | Freq: Three times a day (TID) | ORAL | 3 refills | 20 days | Status: CP | PRN
Start: 2021-07-09 — End: ?

## 2021-07-13 ENCOUNTER — Ambulatory Visit: Admit: 2021-07-13 | Discharge: 2021-07-14 | Payer: PRIVATE HEALTH INSURANCE

## 2021-07-13 DIAGNOSIS — C20 Malignant neoplasm of rectum: Principal | ICD-10-CM

## 2021-07-22 DIAGNOSIS — C20 Malignant neoplasm of rectum: Principal | ICD-10-CM

## 2021-08-10 ENCOUNTER — Ambulatory Visit: Admit: 2021-08-10 | Discharge: 2021-08-11 | Payer: PRIVATE HEALTH INSURANCE

## 2021-08-10 DIAGNOSIS — C20 Malignant neoplasm of rectum: Principal | ICD-10-CM

## 2021-08-12 ENCOUNTER — Ambulatory Visit: Admit: 2021-08-12 | Discharge: 2021-08-13 | Payer: PRIVATE HEALTH INSURANCE

## 2021-08-12 ENCOUNTER — Ambulatory Visit: Admit: 2021-08-12 | Discharge: 2021-08-12 | Discharge status: Against Medical Advice | Payer: PRIVATE HEALTH INSURANCE

## 2021-08-13 ENCOUNTER — Telehealth: Admit: 2021-08-13 | Discharge: 2021-08-14 | Payer: PRIVATE HEALTH INSURANCE

## 2021-08-13 DIAGNOSIS — C20 Malignant neoplasm of rectum: Principal | ICD-10-CM

## 2021-08-13 DIAGNOSIS — Z09 Encounter for follow-up examination after completed treatment for conditions other than malignant neoplasm: Principal | ICD-10-CM

## 2021-08-13 DIAGNOSIS — E109 Type 1 diabetes mellitus without complications: Principal | ICD-10-CM

## 2021-08-13 DIAGNOSIS — C787 Secondary malignant neoplasm of liver and intrahepatic bile duct: Principal | ICD-10-CM

## 2021-08-19 ENCOUNTER — Ambulatory Visit: Admit: 2021-08-19 | Payer: PRIVATE HEALTH INSURANCE | Attending: Surgical Oncology | Primary: Surgical Oncology

## 2021-08-19 DIAGNOSIS — C20 Malignant neoplasm of rectum: Principal | ICD-10-CM

## 2021-08-19 DIAGNOSIS — C787 Secondary malignant neoplasm of liver and intrahepatic bile duct: Principal | ICD-10-CM

## 2021-08-24 ENCOUNTER — Ambulatory Visit: Admit: 2021-08-24 | Discharge: 2021-08-24 | Payer: PRIVATE HEALTH INSURANCE

## 2021-08-24 DIAGNOSIS — R630 Anorexia: Principal | ICD-10-CM

## 2021-08-24 DIAGNOSIS — C20 Malignant neoplasm of rectum: Principal | ICD-10-CM

## 2021-08-24 MED ORDER — LIDOCAINE HCL 2 % MUCOSAL SOLUTION
0 refills | 0 days | Status: CP
Start: 2021-08-24 — End: ?

## 2021-08-24 MED ORDER — DIPHENHYDRAMINE 12.5 MG/5 ML ORAL LIQUID
0 refills | 0 days | Status: CP
Start: 2021-08-24 — End: ?

## 2021-08-24 MED ORDER — ALUMINUM-MAG HYDROXIDE-SIMETHICONE 200 MG-200 MG-20 MG/5 ML ORAL SUSP
0 refills | 0 days | Status: CP
Start: 2021-08-24 — End: ?

## 2021-08-25 MED ORDER — TADALAFIL 5 MG TABLET
ORAL_TABLET | 11 refills | 0 days
Start: 2021-08-25 — End: ?

## 2021-08-26 MED ORDER — TADALAFIL 5 MG TABLET
ORAL_TABLET | 11 refills | 0 days | Status: CP
Start: 2021-08-26 — End: ?

## 2021-09-07 ENCOUNTER — Ambulatory Visit: Admit: 2021-09-07 | Discharge: 2021-09-08 | Payer: PRIVATE HEALTH INSURANCE

## 2021-09-07 DIAGNOSIS — C20 Malignant neoplasm of rectum: Principal | ICD-10-CM

## 2021-09-21 ENCOUNTER — Institutional Professional Consult (permissible substitution): Admit: 2021-09-21 | Discharge: 2021-09-22 | Payer: PRIVATE HEALTH INSURANCE

## 2021-09-21 ENCOUNTER — Ambulatory Visit: Admit: 2021-09-21 | Discharge: 2021-09-22 | Payer: PRIVATE HEALTH INSURANCE

## 2021-09-21 DIAGNOSIS — C20 Malignant neoplasm of rectum: Principal | ICD-10-CM

## 2021-09-29 ENCOUNTER — Ambulatory Visit: Admit: 2021-09-29 | Discharge: 2021-09-29 | Payer: PRIVATE HEALTH INSURANCE

## 2021-10-01 ENCOUNTER — Institutional Professional Consult (permissible substitution): Admit: 2021-10-01 | Discharge: 2021-10-02 | Payer: PRIVATE HEALTH INSURANCE

## 2021-10-01 ENCOUNTER — Ambulatory Visit
Admit: 2021-10-01 | Discharge: 2021-10-02 | Payer: PRIVATE HEALTH INSURANCE | Attending: Hematology & Oncology | Primary: Hematology & Oncology

## 2021-10-01 DIAGNOSIS — C20 Malignant neoplasm of rectum: Principal | ICD-10-CM

## 2021-10-01 DIAGNOSIS — C787 Secondary malignant neoplasm of liver and intrahepatic bile duct: Principal | ICD-10-CM

## 2021-10-14 ENCOUNTER — Ambulatory Visit
Admit: 2021-10-14 | Discharge: 2021-10-15 | Payer: PRIVATE HEALTH INSURANCE | Attending: Surgical Oncology | Primary: Surgical Oncology

## 2021-10-14 DIAGNOSIS — C19 Malignant neoplasm of rectosigmoid junction: Principal | ICD-10-CM

## 2021-10-14 DIAGNOSIS — C20 Malignant neoplasm of rectum: Principal | ICD-10-CM

## 2021-10-14 DIAGNOSIS — Z79899 Other long term (current) drug therapy: Principal | ICD-10-CM

## 2021-10-14 MED ORDER — TUCATINIB 150 MG TABLET
ORAL_TABLET | Freq: Two times a day (BID) | ORAL | 11 refills | 30 days | Status: CP
Start: 2021-10-14 — End: ?
  Filled 2021-11-03: qty 120, 30d supply, fill #0

## 2021-10-15 DIAGNOSIS — C19 Malignant neoplasm of rectosigmoid junction: Principal | ICD-10-CM

## 2021-10-23 DIAGNOSIS — C20 Malignant neoplasm of rectum: Principal | ICD-10-CM

## 2021-10-30 NOTE — Unmapped (Signed)
[facility] Specialty Medication Onboarding    Specialty Medication: Matilde Haymakerukysa  Prior Authorization: Approved   Financial Assistance: No - copay  <$25  Final Copay/Day Supply: $0 / 30    Insurance Restrictions: None     Notes to Pharmacist:     The triage team has completed the benefits investigation and has determined that the patient is able to fill this medication at [facility]. Please contact the patient to complete the onboarding or follow up with the prescribing physician as needed.

## 2021-11-02 ENCOUNTER — Ambulatory Visit: Admit: 2021-11-02 | Discharge: 2021-11-03 | Payer: PRIVATE HEALTH INSURANCE

## 2021-11-02 DIAGNOSIS — C19 Malignant neoplasm of rectosigmoid junction: Principal | ICD-10-CM

## 2021-11-02 DIAGNOSIS — Z79899 Other long term (current) drug therapy: Principal | ICD-10-CM

## 2021-11-02 DIAGNOSIS — C20 Malignant neoplasm of rectum: Principal | ICD-10-CM

## 2021-11-02 MED ORDER — SILDENAFIL 25 MG TABLET
ORAL_TABLET | Freq: Every day | ORAL | 5 refills | 60.00000 days | Status: CP | PRN
Start: 2021-11-02 — End: 2021-11-02

## 2021-11-02 NOTE — Unmapped (Signed)
Contacted patient by phone regarding DDI with upcoming tucatinib start. Patient notified of need to adjust dose of sildenafil to 25 mg daily PRN (maximum of 50 mg daily PRN) with upcoming tucatinib start. Patient prefers to use [facility] so prescription was rerouted there; he is aware to discontinue sildenafil 100 mg dose >24 hours prior to initiation of tucatinib. He confirmed he is no longer taking tadalafil and this has been removed from his home medication list.    Patient confirmed understanding not to start tucatinib until further notice (he will bring to appointment with him scheduled for 11/05/21). Plan for cycle 1, day 1 education with patient this week as well.    Care coordination: 30 minutes

## 2021-11-02 NOTE — Unmapped (Signed)
[facility]   Patient Onboarding/Medication Counseling    Max Stewart is a 51 y.o. male with Colorectal cancer stage IV who I am counseling today on initiation of therapy.  I am speaking to the patient.    Was a Nurse, learning disability used for this call? No    Verified patient's date of birth / HIPAA.    Specialty medication(s) to be sent: Hematology/Oncology: Tukysa 150 mg    Non-specialty medications/supplies to be sent: none    Medications not needed at this time: none     Tukysa (tucatinib)    Medication & Administration     Dosage: Take 2 tablets (300 mg total) by mouth Two (2) times a day.    Administration: I reviewed the importance of taking roughly every 12 hours and can be taken with or without food. Discussed that tablet should be swallowed whole and cannot be crushed or chewed. Generally given with Xeloda or Herceptin.    Adherence/Missed dose instruction: If a dose is missed, do not take an extra dose or two doses at one time. Simply take your next dose at the regularly scheduled time and record any missed doses so our team is aware. If you vomit after taking a dose do not take another dose until next scheduled time.    Goals of Therapy     Prevent disease progression in HER-2 positive advanced unresectable or metastatic breast cancer (including patients with brain metastases).    Side Effects & Monitoring Parameters     Nausea/vomiting (35-58%)  Hypertension (17%)  Severe Diarrhea (median time of onset 12 days) (64-81%)  Fatigue (44%)  Liver problems (elevated LFT's that should be checked baseline and every 3 weeks during treatment) (42%)  Infection precautions - fever 20%  Mouth sores (32%)  Hand/foot syndrome (tingling, numbness, pain, redness, peeling or blistering) (63%)  Decrease appetite/ taste changes (19-25%)  Muscle/joint pain (13-17%)  Rash (20 -37%)    The following side effects should be reported to the provider    Heartbeat that doesn't feel normal (heart feels like it's racing, skipping a beat or fluttering)  Decrease in urination, change in color of urine, blood in urine  Any signs of liver toxicity (yellowing of skin or eyes, itching, dark or brown urine or discomfort in right upper stomach)   Diarrhea not controlled by anti-diarrheal  Mouth irritation or mouth sores  Signs of allergic reaction (rash, hives, shortness of breath)  Redness or irritation on the palms of the hands or soles of the feet  Signs of infection (fever >100.5, chills, sore throat)    Monitoring parameters:    Monitor ALT, AST, and bilirubin (prior to tucatinib initiation, every 3 weeks during treatment, and as clinically indicated); renal function (prior to treatment initiation) and as clinically necessary. Evaluate pregnancy status prior to treatment initiation (in females of reproductive potential). Monitor for signs/symptoms of diarrhea. Monitor adherence.      Warnings & Precautions     Mouth sores-discussed use of baking soda/salt water rinses  Hand/foot prevention (moisturizers, luke warm hand washes, patting hands/feet dry, decrease rubbing on hands/feet)  Importance of hydration if having frequent diarrhea  Importance of good nutrition to help minimize diarrhea (high protein, BRAT, yogurt, avoid greasy and spicy foods)  Exposure of an unborn child to this medication could cause birth defects so you should not become pregnant or father a child while on this medication and for 1 week upon completion of treatment.    Drug/Food Interactions  Medication list reviewed in Epic. The patient was instructed to inform the care team before taking any new medications or supplements.  CYP3A4 Inhibitors (Strong) like tucatinib may increase the serum concentration of Sildenafil/tadalafil. - When sildenafil is used for treatment of erectile dysfunction, consider using a lower starting dose of 25 mg.  For patients taking tadalafil for erectile dysfunction or benign prostatic hypertrophy: Patients taking single ingredient tadalafil once daily: Limit the tadalafil dose to 2.5 mg daily if coadministered with strong CYP3A4 inhibitors. For patients taking tadalafil as needed: The maximum dose of tadalafil is 10 mg, not to exceed one dose every 72 hours. For patients taking the finasteride and tadalafil (5 mg/5 mg) combination product: Coadministration is not recommended. When used for treatment of PAH concurrent use with tucatinib is not recommended.for either sildenafil or tadalafil .  Avoid Grapefruit and grapefruit juice    Storage, Handling Precautions, & Disposal     This medication should be stored at room temperature and in a dry location. Keep out of reach of others including children and pets.   Keep the medicine in the original container with a child-proof top (no pillboxes). Keep the desiccant packet in the original container to protect from moisture and discard any remaining tablets 3 months after opening the bottle.    Do not throw away or flush unused medication down the toilet or sink. This drug is considered hazardous and should be handled as little as possible.  If someone else helps with medication administration, they should wear gloves.    Current Medications (including OTC/herbals), Comorbidities and Allergies     Current Outpatient Medications   Medication Sig Dispense Refill    acetaminophen (TYLENOL) 325 MG tablet Take 3 tablets (975 mg total) by mouth every six (6) hours. (Patient not taking: Reported on 10/14/2021)  0    aluminum-magnesium hydroxide-simethicone (MAALOX PLUS) 200-200-20 mg/5 mL Susp Mix equal parts diphenhydramine, lidocaine, and liquid antacid. Swish 5 to in mouth for 60 seconds, every 4 to 6 hours as needed, then swallow or spit mixture out (as directed by prescriber). Shake well before using. Refrigerate after mixing and discard after 14 days. (Patient not taking: Reported on 10/14/2021) 354 mL 0    citalopram (CELEXA) 40 MG tablet Take 1 tablet (40 mg total) by mouth daily before breakfast. 90 tablet 3    diphenhydrAMINE (BENADRYL) 12.5 mg/5 mL liquid Mix equal parts diphenhydramine, lidocaine, and liquid antacid. Swish 5 to in mouth for 60 seconds, every 4 to 6 hours as needed, then swallow or spit mixture out (as directed by prescriber). Shake well before using. Refrigerate after mixing and discard after 14 days. (Patient not taking: Reported on 10/14/2021) 118 mL 0    heparin, porcine, PF, 100 unit/mL Syrg Infuse 5 mL into a venous catheter every fourteen (14) days. For use while patient is on fluorouracil (5-FU) home infusion.   Flush IV catheter with heparin 5 mL as directed after final saline flush per SASH method and as needed for line maintenance. 4 each PRN    hydroCHLOROthiazide (HYDRODIURIL) 25 MG tablet 1/2 TAB DAILY X 15 DAYS (Patient not taking: Reported on 10/14/2021)      insulin ASPART (NOVOLOG) 100 unit/mL injection Inject 1.2-1.4 mL (120-140 Units total) under the skin daily before breakfast.      lidocaine 2% viscous (XYLOCAINE) 2 % Soln Mix equal parts diphenhydramine, lidocaine, and liquid antacid. Swish 5 to in mouth for 60 seconds, every 4 to 6 hours as  needed, then swallow or spit mixture out (as directed by prescriber). Shake well before using. Refrigerate after mixing and discard after 14 days. 100 mL 0    loperamide (IMODIUM) 2 mg capsule Take 1 capsule (2 mg total) by mouth four (4) times a day as needed for diarrhea (takes once a day). 60 capsule 11    loperamide (IMODIUM) 2 mg capsule TAKE 1 CAPSULE (2 MG TOTAL) BY MOUTH FOUR (4) TIMES A DAY AS NEEDED FOR DIARRHEA (TAKES ONCE A DAY). 360 capsule 1    loratadine (CLARITIN) 10 mg tablet Take 1 tablet (10 mg total) by mouth daily as needed for allergies.      losartan (COZAAR) 50 MG tablet Take 1 tablet (50 mg total) by mouth daily before breakfast.      ondansetron (ZOFRAN) 8 MG tablet Take 1 tablet (8 mg total) by mouth every eight (8) hours as needed for nausea. (Patient not taking: Reported on 10/14/2021) 60 tablet 3    polyethylene glycol (GOLYTELY) 236-22.74-6.74 gram solution Take 4,000 mL by mouth Take as directed for 1 dose. Take as directed per [facility] instructions (Patient not taking: Reported on 10/14/2021) 4000 mL 0    prochlorperazine (COMPAZINE) 10 MG tablet Take 1 tablet (10 mg total) by mouth every six (6) hours as needed (nausea). (Patient not taking: Reported on 10/14/2021) 30 tablet 2    rosuvastatin (CRESTOR) 20 MG tablet Take 1 tablet (20 mg total) by mouth daily.      sildenafiL (VIAGRA) 100 MG tablet Take 1 tablet (100 mg total) by mouth daily as needed for erectile dysfunction. 90 tablet 3    sod picosulf-mag ox-citric ac 10 mg-3.5 gram- 12 gram/160 mL Soln Take 160 mL by mouth Take as directed. Use the instructions from [facility] (Patient not taking: Reported on 10/14/2021) 320 mL 0    sodium chloride (NS) 0.9 % injection Infuse 10 mL into a venous catheter every fourteen (14) days. For use while patient is on fluorouracil (5-FU) home infusion.   Flush IV catheter with normal saline 10 mL prior to and after infusion followed by heparin flush per SASH method and as needed for line maintenance. 6 each PRN    tadalafiL (CIALIS) 5 MG tablet TAKE ONE TABLET BY MOUTH EVERY MORNING AS NEEDED FOR ERECTILE DYSFUNCTION 30 tablet 11    tucatinib (TUKYSA) 150 mg tablet Take 2 tablets (300 mg total) by mouth Two (2) times a day. 120 tablet 11     No current facility-administered medications for this visit.       Allergies   Allergen Reactions    Ciprofloxacin      Liver Disorder       Shellfish Containing Products Hives       Patient Active Problem List   Diagnosis    Rectal adenocarcinoma (CMS-HCC)    Malignant neoplasm metastatic to liver (CMS-HCC)    Colorectal cancer, stage IV (CMS-HCC)    Type 1 diabetes mellitus without complication (CMS-HCC)       Reviewed and up to date in Epic.    Appropriateness of Therapy     Acute infections noted within Epic:  No active infections  Patient reported infection: None    Is medication and dose appropriate based on diagnosis and infection status? Yes    Prescription has been clinically reviewed: Yes      Baseline Quality of Life Assessment      How many days over the past month did your Colorectal cancer  keep  you from your normal activities? For example, brushing your teeth or getting up in the morning. 0    Financial Information     Medication Assistance provided: Prior Authorization    Anticipated copay of $0 / 30 days reviewed with patient. Verified delivery address.    Delivery Information     Scheduled delivery date: 11/04/21    Expected start date: ASAP or ~11/05/21    Medication will be delivered via Next Day Courier to the prescription address in [facility].  This shipment will not require a signature.      Explained the services we provide at [facility] and that each month we would call to set up refills.  Stressed importance of returning phone calls so that we could ensure they receive their medications in time each month.  Informed patient that we should be setting up refills 7-10 days prior to when they will run out of medication.  A pharmacist will reach out to perform a clinical assessment periodically.  Informed patient that a welcome packet, containing information about our pharmacy and other support services, a Notice of Privacy Practices, and a drug information handout will be sent.      The patient or caregiver noted above participated in the development of this care plan and knows that they can request review of or adjustments to the care plan at any time.      Patient or caregiver verbalized understanding of the above information as well as how to contact the pharmacy at [phone] option 4 with any questions/concerns.  The pharmacy is open Monday through Friday 8:30am-4:30pm.  A pharmacist is available 24/7 via pager to answer any clinical questions they may have.    Patient Specific Needs     Does the patient have any physical, cognitive, or cultural barriers? No    Does the patient have adequate living arrangements? (i.e. the ability to store and take their medication appropriately) Yes    Did you identify any home environmental safety or security hazards? No    Patient prefers to have medications discussed with  Patient     Is the patient or caregiver able to read and understand education materials at a high school level or above? Yes    Patient's primary language is  English     Is the patient high risk? No    SOCIAL DETERMINANTS OF HEALTH     At the [facility], we have learned that life circumstances - like trouble affording food, housing, utilities, or transportation can affect the health of many of our patients.   That is why we wanted to ask: are you currently experiencing any life circumstances that are negatively impacting your health and/or quality of life? No    Social Determinants of Health     Financial Resource Strain: Low Risk  (06/30/2021)    Overall Financial Resource Strain (CARDIA)     Difficulty of Paying Living Expenses: Not very hard   Internet Connectivity: No Internet connectivity concern identified (06/30/2021)    Internet Connectivity     Do you have access to internet services: Yes     How do you connect to the internet: Personal Device at home     Is your internet connection strong enough for you to watch video on your device without major problems?: Yes     Do you have enough data to get through the month?: Yes     Does at least one of the devices  have a camera that you can use for video chat?: Yes   Food Insecurity: No Food Insecurity (06/30/2021)    Hunger Vital Sign     Worried About Running Out of Food in the Last Year: Never true     Ran Out of Food in the Last Year: Never true   Tobacco Use: Low Risk  (10/01/2021)    Patient History     Smoking Tobacco Use: Never     Smokeless Tobacco Use: Never     Passive Exposure: Not on file   Housing/Utilities: Low Risk  (06/30/2021)    Housing/Utilities     Within the past 12 months, have you ever stayed: outside, in a car, in a tent, in an overnight shelter, or temporarily in someone else's home (i.e. couch-surfing)?: No     Are you worried about losing your housing?: No     Within the past 12 months, have you been unable to get utilities (heat, electricity) when it was really needed?: No   Alcohol Use: Not on file   Transportation Needs: No Transportation Needs (06/30/2021)    PRAPARE - Transportation     Lack of Transportation (Medical): No     Lack of Transportation (Non-Medical): No   Substance Use: Not on file   Health Literacy: Low Risk  (06/30/2021)    Health Literacy     : Never   Physical Activity: Not on file   Interpersonal Safety: Not on file   Stress: Not on file   Intimate Partner Violence: Not on file   Depression: Not on file   Social Connections: Not on file     Would you be willing to receive help with any of the needs that you have identified today? Not applicable     Kermit Balo, [facility]  [facility] Specialty Pharmacist

## 2021-11-04 NOTE — Unmapped (Unsigned)
[facility] MEDICAL ONCOLOGY     PRIMARY CARE PROVIDER:  Pcp, None Per Patient  [address] [place] [address]    CONSULTING PROVIDERS  Dr. Rayetta Humphrey - Radiation Oncology  Dr. Rico Junker, [facility] Surgical Oncology   Dr. Pearlean Brownie, [facility] Surgical Oncology  ____________________________________________________________________    CANCER HISTORY  Diagnosis 10/12/17 metastatic Rectal Adenocarcinoma with synchronous liver mets, work-up with a 4cm rectal mass and 5 metastatic hepatic lesions    11/2017-03/2018. FOLFOX x 8  05/2018 chemoradiotherapy to primary.   -Clinical complete response in liver and rectum, managed with Watch and Wait approach  03/2019 recurrent disease in rectum   05/2019 proctectomy with coloanal anastomosis, liver wedge resection (seg 3): liver with fibrosis, rectum invasive mod to poorly differentiated adenocarcinoma, 3 cm; into muscularis propria, no LVI, tumor at distal margin in area of tearing superceded by additional specimen, 0/16 nodes, two with prominent treatment effect. YpT2N0.  - complicated by abdominal abscess with MRSA  08/02/19 closure of diverting ileostomy  02/2020 2 sites of recurrence disease in liver adjacent to GB fossa  04/14/20 seg 4b wedge resection: two foci of adenocarcinoma c/w colorectal primary. Margin +; node negative   01/2021 SBRT 5000cGy in 68fx to met near GB fossa  06/2021 -09/2021 pelvic recurrence, FOLFOX x 6  11/05/2021 tucatinib-trastuzumab start [baseline echo nl]      MOLECULAR PROFILE   TEMPUS from 06/2021 rectal mass biopsy:  HER2 amplified  TP53 and APC mutation  RAS/RAF WT  TMB 6.3, MSS    Treatment plan:  tucatinib + trastuzumab. Consider metastectomy q6-52m pending response    ___________________________________________________________________    ASSESSMENT  1. 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 Type I DM- controlled on insulin pump, no complications from DM  4. ECOG 0    RECOMMENDATIONS  1. 0    RECOMMENDATIONS  1.  Tucatinib 300 mg BID + trastuzumab   2, RTC in 3 weeks prior to next tmab to assess tolerance; scans 9 weeks/ 3 cycles       DISCUSSION  Reviewed plan for HER2 directed therapy for now with consideration of metastectomy in 6-57m pending response and his wishes. Reviewed AEs for T/T and he has had his \\questions  answered and wants to go forward with treatment      Medical decision making based high complexity of cancer and treatment risk of complications and monitoring and management of toxicity of chemotherapy.     ______________________________________________________________    Interval History  Max Stewart returns today for ongoing management of his rectal cancer with liver mets.    [name] notes ***    MEDICAL HISTORY  1. Type I DM- diagnosed at age 8, on insulin     SOCIAL HISTORY  Prior Denies smoking and drugs. Drinks occasional alcohol. 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 He lives with his wife and two daughters age 59 and 40.     PHYSICAL EXAM  GENERAL: well developed, well nourished, in no distress  PSYCH: full and appropriate range of affect with good insight and judgement  HEENT: NCAT, pupils equal, sclerae anicteric  SKIN: No rashes    OBJECTIVE DATA  Reviewed in Epic   CBC, CMP reviewed      Echo normal    RADIOLOGY RESULTS   No scan stoday

## 2021-11-05 ENCOUNTER — Ambulatory Visit: Admit: 2021-11-05 | Discharge: 2021-11-05 | Payer: PRIVATE HEALTH INSURANCE

## 2021-11-05 ENCOUNTER — Ambulatory Visit
Admit: 2021-11-05 | Discharge: 2021-11-05 | Payer: PRIVATE HEALTH INSURANCE | Attending: Hematology & Oncology | Primary: Hematology & Oncology

## 2021-11-05 ENCOUNTER — Institutional Professional Consult (permissible substitution): Admit: 2021-11-05 | Discharge: 2021-11-05 | Payer: PRIVATE HEALTH INSURANCE

## 2021-11-05 DIAGNOSIS — C19 Malignant neoplasm of rectosigmoid junction: Principal | ICD-10-CM

## 2021-11-05 DIAGNOSIS — C20 Malignant neoplasm of rectum: Principal | ICD-10-CM

## 2021-11-05 LAB — CBC W/ AUTO DIFF
BASOPHILS ABSOLUTE COUNT: 0 10*9/L (ref 0.0–0.1)
BASOPHILS RELATIVE PERCENT: 0.7 %
EOSINOPHILS ABSOLUTE COUNT: 0.1 10*9/L (ref 0.0–0.5)
EOSINOPHILS RELATIVE PERCENT: 2.7 %
HEMATOCRIT: 41.4 % (ref 39.0–48.0)
HEMOGLOBIN: 14.1 g/dL (ref 12.9–16.5)
LYMPHOCYTES ABSOLUTE COUNT: 0.8 10*9/L — ABNORMAL LOW (ref 1.1–3.6)
LYMPHOCYTES RELATIVE PERCENT: 24.4 %
MEAN CORPUSCULAR HEMOGLOBIN CONC: 33.9 g/dL (ref 32.0–36.0)
MEAN CORPUSCULAR HEMOGLOBIN: 32.5 pg — ABNORMAL HIGH (ref 25.9–32.4)
MEAN CORPUSCULAR VOLUME: 95.8 fL — ABNORMAL HIGH (ref 77.6–95.7)
MEAN PLATELET VOLUME: 9.3 fL (ref 6.8–10.7)
MONOCYTES ABSOLUTE COUNT: 0.3 10*9/L (ref 0.3–0.8)
MONOCYTES RELATIVE PERCENT: 8.6 %
NEUTROPHILS ABSOLUTE COUNT: 2.2 10*9/L (ref 1.8–7.8)
NEUTROPHILS RELATIVE PERCENT: 63.6 %
NUCLEATED RED BLOOD CELLS: 0 /100{WBCs} (ref <=4)
PLATELET COUNT: 145 10*9/L — ABNORMAL LOW (ref 150–450)
RED BLOOD CELL COUNT: 4.33 10*12/L (ref 4.26–5.60)
RED CELL DISTRIBUTION WIDTH: 14.6 % (ref 12.2–15.2)
WBC ADJUSTED: 3.4 10*9/L — ABNORMAL LOW (ref 3.6–11.2)

## 2021-11-05 LAB — COMPREHENSIVE METABOLIC PANEL
ALBUMIN: 3.5 g/dL (ref 3.4–5.0)
ALKALINE PHOSPHATASE: 117 U/L — ABNORMAL HIGH (ref 46–116)
ALT (SGPT): 57 U/L — ABNORMAL HIGH (ref 10–49)
ANION GAP: 7 mmol/L (ref 5–14)
AST (SGOT): 35 U/L — ABNORMAL HIGH (ref <=34)
BILIRUBIN TOTAL: 0.6 mg/dL (ref 0.3–1.2)
BLOOD UREA NITROGEN: 14 mg/dL (ref 9–23)
BUN / CREAT RATIO: 18
CALCIUM: 8.4 mg/dL — ABNORMAL LOW (ref 8.7–10.4)
CHLORIDE: 106 mmol/L (ref 98–107)
CO2: 26.4 mmol/L (ref 20.0–31.0)
CREATININE: 0.78 mg/dL
EGFR CKD-EPI (2021) MALE: >90 mL/min/{1.73_m2} (ref >=60)
GLUCOSE RANDOM: 251 mg/dL — ABNORMAL HIGH (ref 70–179)
POTASSIUM: 4.2 mmol/L (ref 3.4–4.8)
PROTEIN TOTAL: 6.2 g/dL (ref 5.7–8.2)
SODIUM: 139 mmol/L (ref 135–145)

## 2021-11-05 LAB — CEA: CARCINOEMBRYONIC ANTIGEN: 0.9 ng/mL (ref 0.0–5.0)

## 2021-11-05 MED ORDER — LOPERAMIDE 2 MG TABLET
ORAL_TABLET | 1 refills | 0 days | Status: CP
Start: 2021-11-05 — End: ?

## 2021-11-05 MED ADMIN — heparin, porcine (PF) 100 unit/mL injection 500 Units: 500 [IU] | INTRAVENOUS | @ 17:00:00 | Stop: 2021-11-05

## 2021-11-05 MED ADMIN — heparin, porcine (PF) 100 unit/mL injection 500 Units: 500 [IU] | INTRAVENOUS | @ 13:00:00 | Stop: 2021-11-05

## 2021-11-05 MED ADMIN — sodium chloride (NS) 0.9 % infusion: 100 mL/h | INTRAVENOUS | @ 16:00:00 | Stop: 2021-11-05

## 2021-11-05 MED ADMIN — trastuzumab-dkst (OGIVRI) 840 mg in sodium chloride (NS) 0.9 % 250 mL IVPB: 8 mg/kg | INTRAVENOUS | @ 16:00:00 | Stop: 2021-11-05

## 2021-11-05 NOTE — Unmapped (Signed)
Patient arrived in the infusion clinic at 1043.  Weight and Vitals were obtained. Prior to infusion visit the patient had a nurse visit where their port was accessed, flushed with blood return, dressing clean, dry, and intact.  Labs were drawn and resulted within treatment plan parameters. No premedications were ordered per treatment plan. Patient received Trastuzumab-dkst chemotherapy regiment as ordered without complications while in the clinic.  Port was flushed with blood return, heparin locked, then de-accessed area covered with 2x2 gauze and ban-aid.  Patient received printed after visit summary then discharged home to self care.

## 2021-11-05 NOTE — Unmapped (Incomplete)
Pharmacist Oral Chemotherapy Education     Max Stewart is a 51 y.o. male with rectal cancer who was counseled prior to the initiation tucatinib and trastuzumab. Trastuzumab biosimilar to be administered in infusion clinic once every 3 weeks.     Side effects and warnings/precautions discussed included but were not limited to:  Diarrhea  Hepatic dysfunction  Nausea  Fatigue  Rash  Abdominal pain  Infusion reactions  Heart failure  Pulmonary dysfunction  Reproductive concerns and possibility of fertility problems     Pharmacist Oral Chemotherapy (Tucatinib) Education      Dose and schedule discussed:  Tucatinib 150 mg tablets: Take 2 tablets (300 mg total) by mouth two (2) times a day.  Doses are to be taken approximately 12 hours apart and at the same time each day with or without a meal.     Missed dose information discussed: If a dose is missed, do not take an extra dose or two doses at one time. Simply take your next dose at the regularly scheduled time and record any missed doses so our team is aware.     Side effects and warnings/precautions discussed (including but not limited to):     See above     A corresponding management plan for these adverse effects was also shared with the patient. Instructions on when to contact our care team were discussed with the patient.     Drug interactions discussed: Medication list reviewed in Epic. The patient was instructed to inform the care team before taking any new medications or supplements. Sildenafil has been dose reduced.     Storage, handling, and disposal information discussed: Oral chemotherapy handling precautions reviewed. Store at room temperature, in a dry location, away from light. Keep out of reach of others including children and pets. Keep stored in originally provided packaging until the medication is ready to be taken. Do not flush unused medication down the toilet or sink or throw away in household trash; throw away safely by returning unused medication to [facility] or to a local take-back program for proper disposal.     Education handout provided from: [facility] patient education database     Adherence discussed: Stressed the importance of taking medication as prescribed and to contact provider if that changes at any time.     Supportive care plan:  Reviewed directions for: nausea treatment/prevention with ondansetron and prochlorperazine as well as directions for use of loperamide for chemotherapy-related diarrhea.    As needed medications for nausea:  Ondansetron (Zofran) 8 mg tablets: Take 1 tablet (8 mg total) by mouth every eight (8) hours as needed for nausea.   Prochlorperazine (Compazine) 10 mg tablets: Take 1 tablet (10 mg total) by mouth every six (6) hours as needed for nausea.     As needed medications for diarrhea:  Loperamide (Imodium-AD) 2 mg tablets: Take 2 tablets (4 mg) by mouth for first loose stool followed by 1 tablet (2 mg) after each loose stool thereafter (maximum of 8 tablets per day).     All of the patient's questions were answered and the patient confirmed understanding of how to take their medication.    Konrad Penta, PharmD, BCOP, CPP    {    Coding tips - Do not edit this text, it will delete upon signing of note!    Telephone visits ([phone] for Physicians and APP???s and [phone] for Non- Physician Clinicians)- Only use minutes on the phone to determine level of service.    Video  visits [phone]) - Use both minutes on video and pre/post minutes to determine level of service.       :[id number]}  The patient reports they are currently: {patient location:81390}. I spent *** minutes on the {phone audio video visit:67489} with the patient on the date of service. I spent an additional *** minutes on pre- and post-visit activities on the date of service.     The patient was physically located in [place] or a state in which I am permitted to provide care. The patient and/or parent/guardian understood that s/he may incur co-pays and cost sharing, and agreed to the telemedicine visit. The visit was reasonable and appropriate under the circumstances given the patient's presentation at the time.    The patient and/or parent/guardian has been advised of the potential risks and limitations of this mode of treatment (including, but not limited to, the absence of in-person examination) and has agreed to be treated using telemedicine. The patient's/patient's family's questions regarding telemedicine have been answered.     If the visit was completed in an ambulatory setting, the patient and/or parent/guardian has also been advised to contact their provider???s office for worsening conditions, and seek emergency medical treatment and/or call 911 if the patient deems either necessary.

## 2021-11-05 NOTE — Unmapped (Addendum)
Tucatinib and trastuzumab medication information handouts attached.    Clinical Support on 11/05/2021   Component Date Value Ref Range Status    Sodium 11/05/2021 139  135 - 145 mmol/L Final    Potassium 11/05/2021 4.2  3.4 - 4.8 mmol/L Final    Chloride 11/05/2021 106  98 - 107 mmol/L Final    CO2 11/05/2021 26.4  20.0 - 31.0 mmol/L Final    Anion Gap 11/05/2021 7  5 - 14 mmol/L Final    BUN 11/05/2021 14  9 - 23 mg/dL Final    Creatinine 16/11/9602 0.78  0.60 - 1.10 mg/dL Final    BUN/Creatinine Ratio 11/05/2021 18   Final    eGFR CKD-EPI (2021) Male 11/05/2021 >90  >=60 mL/min/1.65m2 Final    eGFR calculated with CKD-EPI 2021 equation in accordance with [employer] and [employer] of Nephrology Task Force recommendations.    Glucose 11/05/2021 251 (H)  70 - 179 mg/dL Final    Calcium 54/10/8117 8.4 (L)  8.7 - 10.4 mg/dL Final    Albumin 14/78/2956 3.5  3.4 - 5.0 g/dL Final    Total Protein 11/05/2021 6.2  5.7 - 8.2 g/dL Final    Total Bilirubin 11/05/2021 0.6  0.3 - 1.2 mg/dL Final    AST 21/30/8657 35 (H)  <=34 U/L Final    ALT 11/05/2021 57 (H)  10 - 49 U/L Final    Alkaline Phosphatase 11/05/2021 117 (H)  46 - 116 U/L Final    CEA 11/05/2021 0.9  0.0 - 5.0 ng/mL Final    WBC 11/05/2021 3.4 (L)  3.6 - 11.2 10*9/L Final    RBC 11/05/2021 4.33  4.26 - 5.60 10*12/L Final    HGB 11/05/2021 14.1  12.9 - 16.5 g/dL Final    HCT 84/69/6295 41.4  39.0 - 48.0 % Final    MCV 11/05/2021 95.8 (H)  77.6 - 95.7 fL Final    MCH 11/05/2021 32.5 (H)  25.9 - 32.4 pg Final    MCHC 11/05/2021 33.9  32.0 - 36.0 g/dL Final    RDW 28/41/3244 14.6  12.2 - 15.2 % Final    MPV 11/05/2021 9.3  6.8 - 10.7 fL Final    Platelet 11/05/2021 145 (L)  150 - 450 10*9/L Final    nRBC 11/05/2021 0  <=4 /100 WBCs Final    Neutrophils % 11/05/2021 63.6  % Final    Lymphocytes % 11/05/2021 24.4  % Final    Monocytes % 11/05/2021 8.6  % Final    Eosinophils % 11/05/2021 2.7  % Final    Basophils % 11/05/2021 0.7  % Final    Absolute Neutrophils 11/05/2021 2.2  1.8 - 7.8 10*9/L Final    Absolute Lymphocytes 11/05/2021 0.8 (L)  1.1 - 3.6 10*9/L Final    Absolute Monocytes 11/05/2021 0.3  0.3 - 0.8 10*9/L Final    Absolute Eosinophils 11/05/2021 0.1  0.0 - 0.5 10*9/L Final    Absolute Basophils 11/05/2021 0.0  0.0 - 0.1 10*9/L Final

## 2021-11-05 NOTE — Unmapped (Signed)
I attempted to contact [name] on 11/05/2021 by phone in order to follow up on tucatinib and trastuzumab education review, but was unable to reach the patient. I left a voicemail with my contact information and requested a call back. Tucatinib and trastuzumab medication information handouts attached to infusion visit AVS; infusion clinic RN notified of the update. Per infusion clinic RN, patient is unavailable for medication education at this time.    Care coordination: 3 minutes

## 2021-11-10 NOTE — Unmapped (Signed)
RN Navigator contacted pt to check on status after starting tucatinib and trastuzumab. Trastuzumab first infusion 9/28.    Pt reports that he is doing OK. Diarrhea continues to be a problem. Will not have BM x 1-2 days and then will have diarrhea 3-4  times in a day. Pt states it has been an ongoing issue. Drinking fluids.    Pt reports that he received the completed insurance form that Animal nutritionist to him. Pt's wife was planning to send it in.     Pt denies any other questions or concerns. Encouraged pt to call with any future needs.

## 2021-11-16 NOTE — Unmapped (Signed)
Called and spoke with pt and got him rescheduled to come in earlier to see T Triglianos and infusion on 10/19, Pt verbally confirmed new appt times

## 2021-11-21 NOTE — Unmapped (Signed)
I called [name] to check in on his tucatinib and side effects. Overall he reports doing well on therapy.     Checklist of possible side effects reviewed:    Reports:  Diarrhea - manageable with 1-3 loperamide per episode  Denies: Fatigue, abdominal pain, rash, hand/foot symptoms, appetite issues, nausea, other serious symptoms.     Adherence:  He has been able to take two tablets twice daily.  He currently has enough medication and plans to call the pharmacy if they don't contact him a week before.      We discussed that he has an appointment next week with Tammy Triglianos and an infusion appointment.      No other questions or issues at this time.  Approximate time spent: 10 minutes    Elaina Pattee, PharmD, BCOP, CPP

## 2021-11-25 NOTE — Unmapped (Addendum)
[facility] GI MEDICAL ONCOLOGY     PRIMARY CARE PROVIDER:  Pcp, None Per Patient  [address] [place] [address]    CONSULTING PROVIDERS  Dr. Rayetta Humphrey - Radiation Oncology  Dr. Rico Junker, [facility] Surgical Oncology   Dr. 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 FOLFOX x 8  05/2018 chemoradiotherapy to primary.   -Clinical complete response in liver and rectum, managed with Watch and Wait approach  03/2019 recurrent disease in rectum   05/2019 proctectomy with coloanal anastomosis, liver wedge resection (seg 3): liver with fibrosis, rectum invasive mod to poorly differentiated adenocarcinoma, 3 cm; into muscularis propria, no LVI, tumor at distal margin in area of tearing superceded by additional specimen, 0/16 nodes, two with prominent treatment effect. YpT2N0.  - complicated by abdominal abscess with MRSA  08/02/19 closure of diverting ileostomy  02/2020 2 sites of recurrence disease in liver adjacent to GB fossa  04/14/20 seg 4b wedge resection: two foci of adenocarcinoma c/w colorectal primary. Margin +; node negative   01/2021 SBRT 5000cGy in 50fx to met near GB fossa  06/2021 -09/2021 pelvic recurrence, FOLFOX x 6  11/05/2021 tucatinib-trastuzumab start [baseline echo nl]      MOLECULAR PROFILE   TEMPUS from 06/2021 rectal mass biopsy:  HER2 amplified  TP53 and APC mutation  RAS/RAF WT  TMB 6.3, MSS    Treatment plan:  tucatinib + trastuzumab. Consider metastectomy q6-40m pending response    ___________________________________________________________________    ASSESSMENT  1. 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 Type I DM- controlled on insulin pump, no complications from DM  3. Elevated transaminases (also on Crestor, continue to monitor LFTs, may need to hold if LFTs continue to increase)  4. ECOG 0    RECOMMENDATIONS  1. Tucatinib 300 mg BID + trastuzumab.  HOLD tucatinib x 1 week.  Recheck LFTs in 1 week.  If improved to < grade 1, then plan to resume at 250mg  BID.  New rx sent to pharmacy.  2. RTC in 3 weeks for treatment, 6 weeks for CT, MRI, labs, visit and treatment    DISCUSSION  Did great with his first treatment.  Reviewed labs, elevated transaminases.  They are just under 5x ULN, but favor holding for now and rechecking LFTs in one week.  Plan to restart at lower dose.    Pt was discussed with Dr. Forbes Cellar.    Medical decision making based high complexity of cancer and treatment risk of complications and monitoring and management of toxicity of treatment.   ______________________________________________________________    Interval History  Max Stewart returns today for ongoing management of his rectal cancer with liver mets.    Max Stewart comes in doing ok.  Started on tucatinib and trastuzumab.  Has noticed more loose stools.  Using loperamide and feels this helps.  Energy is good, keeping up with work.  Eating well.  No nausea.    MEDICAL HISTORY  1. Type I DM- diagnosed at age 1, on insulin     SOCIAL HISTORY  Prior Denies smoking and drugs. Drinks occasional alcohol. 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 He lives with his wife and two daughters age 81 and 69.     PHYSICAL EXAM  Vitals:  11/26/21 0850   BP: 141/73   Pulse: 60   Resp: 16   Temp: 36.1 ??C (96.9 ??F)   SpO2: 98%     GENERAL: well developed, well nourished, in no distress  PSYCH: full and appropriate range of affect with good insight and judgement  HEENT: NCAT, pupils equal, sclerae anicteric  SKIN: No rashes    OBJECTIVE DATA  Reviewed in Epic   CBC, CMP reviewed      RADIOLOGY RESULTS   None today

## 2021-11-26 ENCOUNTER — Ambulatory Visit: Admit: 2021-11-26 | Discharge: 2021-11-26 | Payer: PRIVATE HEALTH INSURANCE

## 2021-11-26 ENCOUNTER — Institutional Professional Consult (permissible substitution): Admit: 2021-11-26 | Discharge: 2021-11-26 | Payer: PRIVATE HEALTH INSURANCE

## 2021-11-26 DIAGNOSIS — R7401 Elevated transaminase level: Principal | ICD-10-CM

## 2021-11-26 DIAGNOSIS — C20 Malignant neoplasm of rectum: Principal | ICD-10-CM

## 2021-11-26 DIAGNOSIS — C19 Malignant neoplasm of rectosigmoid junction: Principal | ICD-10-CM

## 2021-11-26 DIAGNOSIS — C787 Secondary malignant neoplasm of liver and intrahepatic bile duct: Principal | ICD-10-CM

## 2021-11-26 DIAGNOSIS — E109 Type 1 diabetes mellitus without complications: Principal | ICD-10-CM

## 2021-11-26 DIAGNOSIS — Z09 Encounter for follow-up examination after completed treatment for conditions other than malignant neoplasm: Principal | ICD-10-CM

## 2021-11-26 DIAGNOSIS — Z79899 Other long term (current) drug therapy: Principal | ICD-10-CM

## 2021-11-26 LAB — CBC W/ AUTO DIFF
BASOPHILS ABSOLUTE COUNT: 0 10*9/L (ref 0.0–0.1)
BASOPHILS RELATIVE PERCENT: 0.8 %
EOSINOPHILS ABSOLUTE COUNT: 0.1 10*9/L (ref 0.0–0.5)
EOSINOPHILS RELATIVE PERCENT: 2.2 %
HEMATOCRIT: 40.9 % (ref 39.0–48.0)
HEMOGLOBIN: 13.9 g/dL (ref 12.9–16.5)
LYMPHOCYTES ABSOLUTE COUNT: 0.8 10*9/L — ABNORMAL LOW (ref 1.1–3.6)
LYMPHOCYTES RELATIVE PERCENT: 20.7 %
MEAN CORPUSCULAR HEMOGLOBIN CONC: 34 g/dL (ref 32.0–36.0)
MEAN CORPUSCULAR HEMOGLOBIN: 32 pg (ref 25.9–32.4)
MEAN CORPUSCULAR VOLUME: 94.1 fL (ref 77.6–95.7)
MEAN PLATELET VOLUME: 9.2 fL (ref 6.8–10.7)
MONOCYTES ABSOLUTE COUNT: 0.4 10*9/L (ref 0.3–0.8)
MONOCYTES RELATIVE PERCENT: 10.6 %
NEUTROPHILS ABSOLUTE COUNT: 2.4 10*9/L (ref 1.8–7.8)
NEUTROPHILS RELATIVE PERCENT: 65.7 %
NUCLEATED RED BLOOD CELLS: 0 /100{WBCs} (ref <=4)
PLATELET COUNT: 159 10*9/L (ref 150–450)
RED BLOOD CELL COUNT: 4.35 10*12/L (ref 4.26–5.60)
RED CELL DISTRIBUTION WIDTH: 13.5 % (ref 12.2–15.2)
WBC ADJUSTED: 3.7 10*9/L (ref 3.6–11.2)

## 2021-11-26 LAB — CEA: CARCINOEMBRYONIC ANTIGEN: 1.1 ng/mL (ref 0.0–5.0)

## 2021-11-26 LAB — COMPREHENSIVE METABOLIC PANEL
ALBUMIN: 3.7 g/dL (ref 3.4–5.0)
ALKALINE PHOSPHATASE: 185 U/L — ABNORMAL HIGH (ref 46–116)
ALT (SGPT): 228 U/L — ABNORMAL HIGH (ref 10–49)
ANION GAP: 8 mmol/L (ref 5–14)
AST (SGOT): 165 U/L — ABNORMAL HIGH (ref <=34)
BILIRUBIN TOTAL: 0.6 mg/dL (ref 0.3–1.2)
BLOOD UREA NITROGEN: 15 mg/dL (ref 9–23)
BUN / CREAT RATIO: 12
CALCIUM: 9 mg/dL (ref 8.7–10.4)
CHLORIDE: 108 mmol/L — ABNORMAL HIGH (ref 98–107)
CO2: 27.1 mmol/L (ref 20.0–31.0)
CREATININE: 1.29 mg/dL — ABNORMAL HIGH
EGFR CKD-EPI (2021) MALE: 67 mL/min/{1.73_m2} (ref >=60)
GLUCOSE RANDOM: 128 mg/dL (ref 70–179)
POTASSIUM: 4.1 mmol/L (ref 3.4–4.8)
PROTEIN TOTAL: 6.4 g/dL (ref 5.7–8.2)
SODIUM: 143 mmol/L (ref 135–145)

## 2021-11-26 MED ORDER — TUCATINIB 150 MG TABLET
ORAL_TABLET | Freq: Two times a day (BID) | ORAL | 6 refills | 30 days | Status: CP
Start: 2021-11-26 — End: ?
  Filled 2021-11-26: qty 120, 30d supply, fill #0

## 2021-11-26 MED ORDER — TUCATINIB 50 MG TABLET
ORAL_TABLET | Freq: Two times a day (BID) | ORAL | 6 refills | 30 days | Status: CP
Start: 2021-11-26 — End: ?

## 2021-11-26 MED ORDER — ZZ IMS TEMPLATE
Freq: Two times a day (BID) | ORAL | 6 refills | 0 days | Status: CN
Start: 2021-11-26 — End: ?

## 2021-11-26 MED ADMIN — trastuzumab-dkst (OGIVRI) 630 mg in sodium chloride (NS) 0.9 % 250 mL IVPB: 6 mg/kg | INTRAVENOUS | @ 14:00:00 | Stop: 2021-11-26

## 2021-11-26 MED ADMIN — heparin, porcine (PF) 100 unit/mL injection 500 Units: 500 [IU] | INTRAVENOUS | @ 15:00:00 | Stop: 2021-11-26

## 2021-11-26 MED ADMIN — heparin, porcine (PF) 100 unit/mL injection 500 Units: 500 [IU] | INTRAVENOUS | @ 13:00:00 | Stop: 2021-11-26

## 2021-11-26 MED ADMIN — sodium chloride (NS) 0.9 % infusion: 100 mL/h | INTRAVENOUS | @ 14:00:00 | Stop: 2021-11-26

## 2021-11-26 NOTE — Unmapped (Signed)
Clinical Assessment Needed For: Dose Change  Medication: [name]  Last Fill Date/Day Supply: 9-26 / 30  Copay $0  Was previous dose already scheduled to fill: No    Notes to Pharmacist:

## 2021-11-26 NOTE — Unmapped (Signed)
Patient arrived to infusion chair ambulatory in no distress following his scheduled lab/nurse visit and provider appointment for today's infusion with trastuzumab. Weight and VS taken at earlier appointment and VS repeated on arrival to infusion and remain stable. No treatment parameters in plan. LFT's noted to be elevated and provider discussed with patient. Holding his oral chemo and repeating labs next week. Patient states he has not had any alcohol since last Friday. Trastuzumab ok to give. Dose calculated and verified. Medication released for pharmacy preparation. No premedication needed. NS started and awaiting delivery of medication from pharmacy at 0930. Infusion given without complication over 30 minutes. IV tubing and port flushed following infusion. Blood return noted, port heparinized and needle removed prior to discharge from infusion. Site dressed with 2x2 gauze and band aid.  Patient declined printed copy of AVS at discharge and he left infusion clinic ambulatory verbalizing no complaints.

## 2021-11-26 NOTE — Unmapped (Signed)
[facility] Clinical Assessment & Refill Coordination Note    Max Stewart, DOB: 1970/10/13  Phone: [phone] (home) [phone] (work)    All above HIPAA information was verified with patient.     Was a Nurse, learning disability used for this call? No    Specialty Medication(s):   Hematology/Oncology: Tukysa 150 mg and Tukysa 50 mg tablets     Current Outpatient Medications   Medication Sig Dispense Refill    acetaminophen (TYLENOL) 325 MG tablet Take 3 tablets (975 mg total) by mouth every six (6) hours.  0    aluminum-magnesium hydroxide-simethicone (MAALOX PLUS) 200-200-20 mg/5 mL Susp Mix equal parts diphenhydramine, lidocaine, and liquid antacid. Swish 5 to in mouth for 60 seconds, every 4 to 6 hours as needed, then swallow or spit mixture out (as directed by prescriber). Shake well before using. Refrigerate after mixing and discard after 14 days. 354 mL 0    citalopram (CELEXA) 40 MG tablet Take 1 tablet (40 mg total) by mouth daily before breakfast. 90 tablet 3    diphenhydrAMINE (BENADRYL) 12.5 mg/5 mL liquid Mix equal parts diphenhydramine, lidocaine, and liquid antacid. Swish 5 to in mouth for 60 seconds, every 4 to 6 hours as needed, then swallow or spit mixture out (as directed by prescriber). Shake well before using. Refrigerate after mixing and discard after 14 days. 118 mL 0    heparin, porcine, PF, 100 unit/mL Syrg Infuse 5 mL into a venous catheter every fourteen (14) days. For use while patient is on fluorouracil (5-FU) home infusion.   Flush IV catheter with heparin 5 mL as directed after final saline flush per SASH method and as needed for line maintenance. 4 each PRN    insulin ASPART (NOVOLOG) 100 unit/mL injection Inject 1.2-1.4 mL (120-140 Units total) under the skin daily before breakfast.      lidocaine 2% viscous (XYLOCAINE) 2 % Soln Mix equal parts diphenhydramine, lidocaine, and liquid antacid. Swish 5 to in mouth for 60 seconds, every 4 to 6 hours as needed, then swallow or spit mixture out (as directed by prescriber). Shake well before using. Refrigerate after mixing and discard after 14 days. 100 mL 0    loperamide (IMODIUM A-D) 2 mg tablet Take 2 tablets (4 mg) by mouth for first loose stool followed by 1 tablet (2 mg) after each loose stool thereafter (maximum of 8 tablets per day). 60 tablet 1    loratadine (CLARITIN) 10 mg tablet Take 1 tablet (10 mg total) by mouth daily as needed for allergies.      losartan (COZAAR) 50 MG tablet Take 1 tablet (50 mg total) by mouth daily before breakfast.      ondansetron (ZOFRAN) 8 MG tablet Take 1 tablet (8 mg total) by mouth every eight (8) hours as needed for nausea. 60 tablet 3    prochlorperazine (COMPAZINE) 10 MG tablet Take 1 tablet (10 mg total) by mouth every six (6) hours as needed (nausea). 30 tablet 2    rosuvastatin (CRESTOR) 20 MG tablet Take 1 tablet (20 mg total) by mouth daily.      sildenafiL (VIAGRA) 25 MG tablet Take 1 tablet (25 mg total) by mouth daily as needed for erectile dysfunction. If 1 tablet (25 mg) is ineffective, may take up to maximum of 2 tablets (50 mg) by mouth daily as needed. 60 tablet 5    sodium chloride (NS) 0.9 % injection Infuse 10 mL into a venous catheter every  fourteen (14) days. For use while patient is on fluorouracil (5-FU) home infusion.   Flush IV catheter with normal saline 10 mL prior to and after infusion followed by heparin flush per SASH method and as needed for line maintenance. 6 each PRN    tucatinib (TUKYSA) 150 mg tablet Take 1 tablet (150 mg total) by mouth two (2) times a day with 1 other tucatinib prescription for 250 mg total. Take with or without a meal. 60 tablet 6    tucatinib (TUKYSA) 50 mg tablet Take 2 tablets (100 mg total) by mouth two (2) times a day with 1 other tucatinib prescription for 250 mg total. Take with or without a meal. 120 tablet 6     No current facility-administered medications for this visit.     Facility-Administered Medications Ordered in Other Visits   Medication Dose Route Frequency Provider Last Rate Last Admin    heparin, porcine (PF) 100 unit/mL injection 500 Units  500 Units Intravenous Q30 Min PRN Sanoff, Mabeline Caras, MD   500 Units at 11/26/21 1058    OKAY TO SEND MEDICATION/CHEMOTHERAPY TO OUTPATIENT UNIT   Other Once Sanoff, Mabeline Caras, MD        sodium chloride (NS) 0.9 % infusion  100 mL/hr Intravenous Continuous Sanoff, Mabeline Caras, MD 100 mL/hr at 11/26/21 1000 100 mL/hr at 11/26/21 1000        Changes to medications: Elyes reports no changes at this time.    Allergies   Allergen Reactions    Ciprofloxacin      Liver Disorder       Shellfish Containing Products Hives       Changes to allergies: No    SPECIALTY MEDICATION ADHERENCE     Tukysa 150 mg: ~30 days of medicine on hand   Tukysa 50 mg: 0 days of medicine on hand     Medication Adherence    Patient reported X missed doses in the last month: >5  Specialty Medication: Tukysa 150 mg - 1 tab (150 mg) twice daily -  total dose 250 mg twice daily (Dose reduced from 300 mg bid)  Patient is on additional specialty medications: Yes  Additional Specialty Medications: Tukysa 50 mg - 2 tabs (100 mg) twice daily - total dose 250 mg twice daily  Patient Reported Additional Medication X Missed Doses in the Last Month: all  Informant: patient  Reasons for non-adherence: instructed by provider to hold or take differently                  Confirmed plan for next specialty medication refill: delivery by pharmacy  Refills needed for supportive medications: not needed          Specialty medication(s) dose(s) confirmed:  Dose decreased from 300 mg twice daily to 250 mg twice daily      Are there any concerns with adherence? No    Adherence counseling provided? Not needed    CLINICAL MANAGEMENT AND INTERVENTION      Clinical Benefit Assessment:    Do you feel the medicine is effective or helping your condition? Yes    Clinical Benefit counseling provided? Not needed    Adverse Effects Assessment:    Are you experiencing any side effects?  Blood counts are off.  Medication on hold until blood counts stabilize    Are you experiencing difficulty administering your medicine? No    Quality of Life Assessment:    Quality of Life    Rheumatology  Oncology  2. On a scale of 1-10, how would you rate your ability to manage side effects associated with your specialty medication? (1=no issues, 10 = unable to take medication due to side effects): 3  Dermatology  Cystic Fibrosis          How many days over the past month did your rectal adenocarcinoma  keep you from your normal activities? For example, brushing your teeth or getting up in the morning. 0    Have you discussed this with your provider? Not needed    Acute Infection Status:    Acute infections noted within Epic:  No active infections  Patient reported infection: None    Therapy Appropriateness:    Is therapy appropriate and patient progressing towards therapeutic goals? Yes, therapy is appropriate and should be continued    DISEASE/MEDICATION-SPECIFIC INFORMATION      N/A    Oncology: Is the patient receiving adequate infection prevention treatment? Not applicable  Does the patient have adequate nutritional support? Not applicable    PATIENT SPECIFIC NEEDS     Does the patient have any physical, cognitive, or cultural barriers? No    Is the patient high risk? No    Did the patient require a clinical intervention? No    Does the patient require physician intervention or other additional services (i.e., nutrition, smoking cessation, social work)? No    SOCIAL DETERMINANTS OF HEALTH     At the [facility], we have learned that life circumstances - like trouble affording food, housing, utilities, or transportation can affect the health of many of our patients.   That is why we wanted to ask: are you currently experiencing any life circumstances that are negatively impacting your health and/or quality of life? Patient declined to answer    Social Determinants of Health     Financial Resource Strain: Low Risk  (06/30/2021)    Overall Financial Resource Strain (CARDIA)     Difficulty of Paying Living Expenses: Not very hard   Internet Connectivity: No Internet connectivity concern identified (06/30/2021)    Internet Connectivity     Do you have access to internet services: Yes     How do you connect to the internet: Personal Device at home     Is your internet connection strong enough for you to watch video on your device without major problems?: Yes     Do you have enough data to get through the month?: Yes     Does at least one of the devices have a camera that you can use for video chat?: Yes   Food Insecurity: No Food Insecurity (06/30/2021)    Hunger Vital Sign     Worried About Running Out of Food in the Last Year: Never true     Ran Out of Food in the Last Year: Never true   Tobacco Use: Low Risk  (11/26/2021)    Patient History     Smoking Tobacco Use: Never     Smokeless Tobacco Use: Never     Passive Exposure: Not on file   Housing/Utilities: Low Risk  (06/30/2021)    Housing/Utilities     Within the past 12 months, have you ever stayed: outside, in a car, in a tent, in an overnight shelter, or temporarily in someone else's home (i.e. couch-surfing)?: No     Are you worried about losing your housing?: No     Within the past 12 months, have you been unable to get utilities (heat, electricity) when  it was really needed?: No   Alcohol Use: Not on file   Transportation Needs: No Transportation Needs (06/30/2021)    PRAPARE - Transportation     Lack of Transportation (Medical): No     Lack of Transportation (Non-Medical): No   Substance Use: Not on file   Health Literacy: Low Risk  (06/30/2021)    Health Literacy     : Never   Physical Activity: Not on file   Interpersonal Safety: Not on file   Stress: Not on file   Intimate Partner Violence: Not on file   Depression: Not on file   Social Connections: Not on file     Would you be willing to receive help with any of the needs that you have identified today? Not applicable     SHIPPING     Specialty Medication(s) to be Shipped:   Hematology/Oncology: [name] 50mg     Other medication(s) to be shipped: No additional medications requested for fill at this time     Changes to insurance: No    Delivery Scheduled: Yes, Expected medication delivery date: 11/27/21.     Medication will be delivered via Next Day Courier to the confirmed prescription address in [facility].    The patient will receive a drug information handout for each medication shipped and additional FDA Medication Guides as required.  Verified that patient has previously received a Conservation officer, historic buildings and a Surveyor, mining.    The patient or caregiver noted above participated in the development of this care plan and knows that they can request review of or adjustments to the care plan at any time.      All of the patient's questions and concerns have been addressed.    Kermit Balo, [facility]   [facility] Specialty Pharmacist

## 2021-11-27 NOTE — Unmapped (Signed)
Infusion on 11/26/2021   Component Date Value Ref Range Status    Sodium 11/26/2021 143  135 - 145 mmol/L Final    Potassium 11/26/2021 4.1  3.4 - 4.8 mmol/L Final    Chloride 11/26/2021 108 (H)  98 - 107 mmol/L Final    CO2 11/26/2021 27.1  20.0 - 31.0 mmol/L Final    Anion Gap 11/26/2021 8  5 - 14 mmol/L Final    BUN 11/26/2021 15  9 - 23 mg/dL Final    Creatinine 16/11/9602 1.29 (H)  0.60 - 1.10 mg/dL Final    BUN/Creatinine Ratio 11/26/2021 12   Final    eGFR CKD-EPI (2021) Male 11/26/2021 67  >=60 mL/min/1.62m2 Final    eGFR calculated with CKD-EPI 2021 equation in accordance with [employer] and [employer] of Nephrology Task Force recommendations.    Glucose 11/26/2021 128  70 - 179 mg/dL Final    Calcium 54/10/8117 9.0  8.7 - 10.4 mg/dL Final    Albumin 14/78/2956 3.7  3.4 - 5.0 g/dL Final    Total Protein 11/26/2021 6.4  5.7 - 8.2 g/dL Final    Total Bilirubin 11/26/2021 0.6  0.3 - 1.2 mg/dL Final    AST 21/30/8657 165 (H)  <=34 U/L Final    ALT 11/26/2021 228 (H)  10 - 49 U/L Final    Alkaline Phosphatase 11/26/2021 185 (H)  46 - 116 U/L Final    CEA 11/26/2021 1.1  0.0 - 5.0 ng/mL Final    WBC 11/26/2021 3.7  3.6 - 11.2 10*9/L Final    RBC 11/26/2021 4.35  4.26 - 5.60 10*12/L Final    HGB 11/26/2021 13.9  12.9 - 16.5 g/dL Final    HCT 84/69/6295 40.9  39.0 - 48.0 % Final    MCV 11/26/2021 94.1  77.6 - 95.7 fL Final    MCH 11/26/2021 32.0  25.9 - 32.4 pg Final    MCHC 11/26/2021 34.0  32.0 - 36.0 g/dL Final    RDW 28/41/3244 13.5  12.2 - 15.2 % Final    MPV 11/26/2021 9.2  6.8 - 10.7 fL Final    Platelet 11/26/2021 159  150 - 450 10*9/L Final    nRBC 11/26/2021 0  <=4 /100 WBCs Final    Neutrophils % 11/26/2021 65.7  % Final    Lymphocytes % 11/26/2021 20.7  % Final    Monocytes % 11/26/2021 10.6  % Final    Eosinophils % 11/26/2021 2.2  % Final    Basophils % 11/26/2021 0.8  % Final    Absolute Neutrophils 11/26/2021 2.4  1.8 - 7.8 10*9/L Final    Absolute Lymphocytes 11/26/2021 0.8 (L)  1.1 - 3.6 10*9/L Final    Absolute Monocytes 11/26/2021 0.4  0.3 - 0.8 10*9/L Final    Absolute Eosinophils 11/26/2021 0.1  0.0 - 0.5 10*9/L Final    Absolute Basophils 11/26/2021 0.0  0.0 - 0.1 10*9/L Final          RED ZONE Means: RED ZONE: Take action now!     You need to be seen right away  Symptoms are at a severe level of discomfort    Call 911 or go to your nearest  Hospital for help     - Bleeding that will not stop    - Hard to breathe    - New seizure - Chest pain  - Fall or passing out  -Thoughts of hurting    yourself or others  Call 911 if you are going into the RED ZONE                  YELLOW ZONE Means:     Please call with any new or worsening symptom(s), even if not on this list.  Call [phone]  After hours, weekends, and holidays - you will reach a long recording with specific instructions, If not in an emergency such as above, please listen closely all the way to the end and choose the option that relates to your need.   You can be seen by a provider the same day through our Same Day Acute Care for Patients with Cancer program.      YELLOW ZONE: Take action today     Symptoms are new or worsening  You are not within your goal range for:    - Pain    - Shortness of breath    - Bleeding (nose, urine, stool, wound)    - Feeling sick to your stomach and throwing up    - Mouth sores/pain in your mouth or throat    - Hard stool or very loose stools (increase in       ostomy output)    - No urine for 12 hours    - Feeding tube or other catheter/tube issue    - Redness or pain at previous IV or port/catheter site    - Depressed or anxiety   - Swelling (leg, arm, abdomen,     face, neck)  - Skin rash or skin changes  - Wound issues (redness, drainage,    re-opened)  - Confusion  - Vision changes  - Fever >100.4 F or chills  - Worsening cough with mucus that is    green, yellow, or bloody  - Pain or burning when going to the    bathroom  - Home Infusion Pump Issue- call    [phone] Call your healthcare provider if you are going into the YELLOW ZONE     GREEN ZONE Means:  Your symptoms are under controls  Continue to take your medicine as ordered  Keep all visits to the provider GREEN ZONE: You are in control  No increase or worsening symptoms  Able to take your medicine  Able to drink and eat    - DO NOT use MyChart messages to report red or yellow symptoms. Allow up to 3    business days for a reply.  -MyChart is for non-urgent medication refills, scheduling requests, or other general questions.         [id number] Rev. 08/07/2021  Approved by Oncology Patient Education Committee

## 2021-12-03 ENCOUNTER — Institutional Professional Consult (permissible substitution): Admit: 2021-12-03 | Discharge: 2021-12-04 | Payer: PRIVATE HEALTH INSURANCE

## 2021-12-03 DIAGNOSIS — C20 Malignant neoplasm of rectum: Principal | ICD-10-CM

## 2021-12-03 LAB — HEPATIC FUNCTION PANEL
ALBUMIN: 3.9 g/dL (ref 3.4–5.0)
ALKALINE PHOSPHATASE: 147 U/L — ABNORMAL HIGH (ref 46–116)
ALT (SGPT): 114 U/L — ABNORMAL HIGH (ref 10–49)
AST (SGOT): 63 U/L — ABNORMAL HIGH (ref <=34)
BILIRUBIN DIRECT: 0.3 mg/dL (ref 0.00–0.30)
BILIRUBIN TOTAL: 0.8 mg/dL (ref 0.3–1.2)
PROTEIN TOTAL: 6.6 g/dL (ref 5.7–8.2)

## 2021-12-03 NOTE — Unmapped (Signed)
Pt requested labs to be done by venipuncture today.

## 2021-12-03 NOTE — Unmapped (Signed)
Called Max Stewart to review LFTs.  LFTs much improved, ok to restart Tucatinib at lower dose 250mg  BID.  He already has lower dose med, reviewed different dose tabs, he understands dosing.

## 2021-12-17 ENCOUNTER — Institutional Professional Consult (permissible substitution): Admit: 2021-12-17 | Discharge: 2021-12-18 | Payer: PRIVATE HEALTH INSURANCE

## 2021-12-17 ENCOUNTER — Ambulatory Visit: Admit: 2021-12-17 | Discharge: 2021-12-18 | Payer: PRIVATE HEALTH INSURANCE

## 2021-12-17 DIAGNOSIS — C20 Malignant neoplasm of rectum: Principal | ICD-10-CM

## 2021-12-17 LAB — CBC W/ AUTO DIFF
BASOPHILS ABSOLUTE COUNT: 0 10*9/L (ref 0.0–0.1)
BASOPHILS RELATIVE PERCENT: 0.6 %
EOSINOPHILS ABSOLUTE COUNT: 0.1 10*9/L (ref 0.0–0.5)
EOSINOPHILS RELATIVE PERCENT: 2.1 %
HEMATOCRIT: 41 % (ref 39.0–48.0)
HEMOGLOBIN: 14.2 g/dL (ref 12.9–16.5)
LYMPHOCYTES ABSOLUTE COUNT: 1.1 10*9/L (ref 1.1–3.6)
LYMPHOCYTES RELATIVE PERCENT: 27.9 %
MEAN CORPUSCULAR HEMOGLOBIN CONC: 34.6 g/dL (ref 32.0–36.0)
MEAN CORPUSCULAR HEMOGLOBIN: 31.7 pg (ref 25.9–32.4)
MEAN CORPUSCULAR VOLUME: 91.7 fL (ref 77.6–95.7)
MEAN PLATELET VOLUME: 9.2 fL (ref 6.8–10.7)
MONOCYTES ABSOLUTE COUNT: 0.3 10*9/L (ref 0.3–0.8)
MONOCYTES RELATIVE PERCENT: 7.6 %
NEUTROPHILS ABSOLUTE COUNT: 2.4 10*9/L (ref 1.8–7.8)
NEUTROPHILS RELATIVE PERCENT: 61.8 %
NUCLEATED RED BLOOD CELLS: 0 /100{WBCs} (ref <=4)
PLATELET COUNT: 154 10*9/L (ref 150–450)
RED BLOOD CELL COUNT: 4.47 10*12/L (ref 4.26–5.60)
RED CELL DISTRIBUTION WIDTH: 12.8 % (ref 12.2–15.2)
WBC ADJUSTED: 3.9 10*9/L (ref 3.6–11.2)

## 2021-12-17 LAB — COMPREHENSIVE METABOLIC PANEL
ALBUMIN: 3.6 g/dL (ref 3.4–5.0)
ALKALINE PHOSPHATASE: 135 U/L — ABNORMAL HIGH (ref 46–116)
ALT (SGPT): 110 U/L — ABNORMAL HIGH (ref 10–49)
ANION GAP: 7 mmol/L (ref 5–14)
AST (SGOT): 51 U/L — ABNORMAL HIGH (ref <=34)
BILIRUBIN TOTAL: 0.9 mg/dL (ref 0.3–1.2)
BLOOD UREA NITROGEN: 18 mg/dL (ref 9–23)
BUN / CREAT RATIO: 17
CALCIUM: 8.5 mg/dL — ABNORMAL LOW (ref 8.7–10.4)
CHLORIDE: 105 mmol/L (ref 98–107)
CO2: 26.3 mmol/L (ref 20.0–31.0)
CREATININE: 1.03 mg/dL
EGFR CKD-EPI (2021) MALE: 87 mL/min/{1.73_m2} (ref >=60)
GLUCOSE RANDOM: 395 mg/dL — ABNORMAL HIGH (ref 70–179)
POTASSIUM: 4.6 mmol/L (ref 3.4–4.8)
PROTEIN TOTAL: 6.1 g/dL (ref 5.7–8.2)
SODIUM: 138 mmol/L (ref 135–145)

## 2021-12-17 LAB — CEA: CARCINOEMBRYONIC ANTIGEN: 1.4 ng/mL (ref 0.0–5.0)

## 2021-12-17 MED ADMIN — heparin, porcine (PF) 100 unit/mL injection 500 Units: 500 [IU] | INTRAVENOUS | @ 18:00:00 | Stop: 2021-12-17

## 2021-12-17 MED ADMIN — heparin, porcine (PF) 100 unit/mL injection 500 Units: 500 [IU] | INTRAVENOUS | @ 20:00:00 | Stop: 2021-12-17

## 2021-12-17 MED ADMIN — sodium chloride (NS) 0.9 % infusion: 100 mL/h | INTRAVENOUS | @ 20:00:00 | Stop: 2021-12-17

## 2021-12-17 MED ADMIN — trastuzumab-dkst (OGIVRI) 630 mg in sodium chloride (NS) 0.9 % 250 mL IVPB: 6 mg/kg | INTRAVENOUS | @ 20:00:00 | Stop: 2021-12-17

## 2021-12-17 NOTE — Unmapped (Addendum)
Clinical Support on 12/17/2021   Component Date Value Ref Range Status    Sodium 12/17/2021 138  135 - 145 mmol/L Final    Potassium 12/17/2021 4.6  3.4 - 4.8 mmol/L Final    Chloride 12/17/2021 105  98 - 107 mmol/L Final    CO2 12/17/2021 26.3  20.0 - 31.0 mmol/L Final    Anion Gap 12/17/2021 7  5 - 14 mmol/L Final    BUN 12/17/2021 18  9 - 23 mg/dL Final    Creatinine 09/81/1914 1.03  0.73 - 1.18 mg/dL Final    BUN/Creatinine Ratio 12/17/2021 17   Final    eGFR CKD-EPI (2021) Male 12/17/2021 87  >=60 mL/min/1.53m2 Final    eGFR calculated with CKD-EPI 2021 equation in accordance with [employer] and [employer] of Nephrology Task Force recommendations.    Glucose 12/17/2021 395 (H)  70 - 179 mg/dL Final    Calcium 78/29/5621 8.5 (L)  8.7 - 10.4 mg/dL Final    Albumin 30/86/5784 3.6  3.4 - 5.0 g/dL Final    Total Protein 12/17/2021 6.1  5.7 - 8.2 g/dL Final    Total Bilirubin 12/17/2021 0.9  0.3 - 1.2 mg/dL Final    AST 69/62/9528 51 (H)  <=34 U/L Final    ALT 12/17/2021 110 (H)  10 - 49 U/L Final    Alkaline Phosphatase 12/17/2021 135 (H)  46 - 116 U/L Final    WBC 12/17/2021 3.9  3.6 - 11.2 10*9/L Final    RBC 12/17/2021 4.47  4.26 - 5.60 10*12/L Final    HGB 12/17/2021 14.2  12.9 - 16.5 g/dL Final    HCT 41/32/4401 41.0  39.0 - 48.0 % Final    MCV 12/17/2021 91.7  77.6 - 95.7 fL Final    MCH 12/17/2021 31.7  25.9 - 32.4 pg Final    MCHC 12/17/2021 34.6  32.0 - 36.0 g/dL Final    RDW 02/72/5366 12.8  12.2 - 15.2 % Final    MPV 12/17/2021 9.2  6.8 - 10.7 fL Final    Platelet 12/17/2021 154  150 - 450 10*9/L Final    nRBC 12/17/2021 0  <=4 /100 WBCs Final    Neutrophils % 12/17/2021 61.8  % Final    Lymphocytes % 12/17/2021 27.9  % Final    Monocytes % 12/17/2021 7.6  % Final    Eosinophils % 12/17/2021 2.1  % Final    Basophils % 12/17/2021 0.6  % Final    Absolute Neutrophils 12/17/2021 2.4  1.8 - 7.8 10*9/L Final    Absolute Lymphocytes 12/17/2021 1.1  1.1 - 3.6 10*9/L Final    Absolute Monocytes 12/17/2021 0.3  0.3 - 0.8 10*9/L Final    Absolute Eosinophils 12/17/2021 0.1  0.0 - 0.5 10*9/L Final    Absolute Basophils 12/17/2021 0.0  0.0 - 0.1 10*9/L Final

## 2021-12-17 NOTE — Unmapped (Signed)
Patient arrived in the infusion clinic at 1400.  Weight and vitals were obtained. Port was accessed during clinic nurse visit. RN flushed with NS, brisk blood return noted. Dressing clean, dry, and intact.  Labs were drawn  during clinic nurse visit and resulted within treatment plan parameters.  Patient was administered trastuzumab as ordered without complications while in the clinic. Port with brisk blood return following infusion. Flushed with NS and heparin per protocol and deaccessed. Gauze and bandaid placed over site. Patient declined after visit summary then discharged home to self care.

## 2021-12-25 MED ORDER — LOPERAMIDE 2 MG CAPSULE
ORAL_CAPSULE | 1 refills | 0 days | Status: CP
Start: 2021-12-25 — End: ?

## 2021-12-26 NOTE — Unmapped (Signed)
[facility] Refill Coordination Note    Specialty Medication(s) to be Shipped:   Hematology/Oncology: [name] 50mg  and 150 mg    Other medication(s) to be shipped: No additional medications requested for fill at this time     Max Stewart, DOB: 1970/04/16  Phone: [phone] (home) [phone] (work)      All above HIPAA information was verified with patient.     Was a Nurse, learning disability used for this call? No    Completed refill call assessment today to schedule patient's medication shipment from the [facility] [phone]).  All relevant notes have been reviewed.     Specialty medication(s) and dose(s) confirmed: Regimen is correct and unchanged.   Changes to medications: Max Stewart reports no changes at this time.  Changes to insurance: No  New side effects reported not previously addressed with a pharmacist or physician: None reported  Questions for the pharmacist: No    Confirmed patient received a Conservation officer, historic buildings and a Surveyor, mining with first shipment. The patient will receive a drug information handout for each medication shipped and additional FDA Medication Guides as required.       DISEASE/MEDICATION-SPECIFIC INFORMATION        N/A    SPECIALTY MEDICATION ADHERENCE     Medication Adherence    Patient reported X missed doses in the last month: 0  Specialty Medication: Tukysa 50 mg  Patient is on additional specialty medications: Yes  Additional Specialty Medications: Tukysa 150 mg  Patient Reported Additional Medication X Missed Doses in the Last Month: 0  Patient is on more than two specialty medications: No  Informant: patient                       Were doses missed due to medication being on hold? No    Tukysa 150 mg: 7 days of medicine on hand   Tukysa 50 mg : 7 days of medicine on hand       REFERRAL TO PHARMACIST     Referral to the pharmacist: Not needed      [facility]     Shipping address confirmed in Epic.     Delivery Scheduled: Yes, Expected medication delivery date: 12/30/21.     Medication will be delivered via Next Day Courier to the prescription address in Epic [place].    [name]   [facility] Specialty Technician

## 2021-12-28 MED FILL — TUKYSA 50 MG TABLET: ORAL | 30 days supply | Qty: 120 | Fill #1

## 2021-12-28 MED FILL — TUKYSA 150 MG TABLET: ORAL | 30 days supply | Qty: 60 | Fill #0

## 2022-01-05 NOTE — Unmapped (Signed)
[facility] GI MEDICAL ONCOLOGY     PRIMARY CARE PROVIDER:  Pcp, None Per Patient  [address] [place] [address]    CONSULTING PROVIDERS  Dr. Rayetta Humphrey - Radiation Oncology  Dr. Rico Junker, [facility] Surgical Oncology   Dr. 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 FOLFOX x 8  05/2018 chemoradiotherapy to primary.   -Clinical complete response in liver and rectum, managed with Watch and Wait approach  03/2019 recurrent disease in rectum   05/2019 proctectomy with coloanal anastomosis, liver wedge resection (seg 3): liver with fibrosis, rectum invasive mod to poorly differentiated adenocarcinoma, 3 cm; into muscularis propria, no LVI, tumor at distal margin in area of tearing superceded by additional specimen, 0/16 nodes, two with prominent treatment effect. YpT2N0.  - complicated by abdominal abscess with MRSA  08/02/19 closure of diverting ileostomy  02/2020 2 sites of recurrence disease in liver adjacent to GB fossa  04/14/20 seg 4b wedge resection: two foci of adenocarcinoma c/w colorectal primary. Margin +; node negative   01/2021 SBRT 5000cGy in 15fx to met near GB fossa  06/2021 -09/2021 pelvic recurrence, FOLFOX x 6  11/05/2021 tucatinib-trastuzumab start [baseline echo nl]  - c1 LFTs 4x ULN. Improved quickly, resumed at tucatinib 250 bid    MOLECULAR PROFILE   TEMPUS from 06/2021 rectal mass biopsy:  HER2 amplified  TP53 and APC mutation  RAS/RAF WT  TMB 6.3, MSS    Treatment plan:  tucatinib + trastuzumab. Consider metastectomy q6-52m pending response    ___________________________________________________________________    ASSESSMENT  1. 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 Type I DM- controlled on insulin pump, no complications from DM  3. Elevated transaminases, mild and stable  4. ECOG 0    RECOMMENDATIONS  1. Tucatinib 250 mg BID + trastuzumab.     2. RTC in 3 weeks for treatment, 6 weeks for labs, visit and treatment    DISCUSSION  Reviewed imaging, good response in pelvis. Abd wal essentially stable to maybe slightly increased. Change not enough particularly given ~5 weeks from scan to treatment start to be convincing for drug resistance. Did discuss pros and cons of biopsy to confirm, including confirming concordance of HER2. Decided to hold for now.     Medical decision making based high complexity of cancer and treatment risk of complications and monitoring and management of toxicity of treatment.   ______________________________________________________________    Interval History  Mr. Delaughter returns today for ongoing management of his rectal cancer with liver mets.    Bowels just a bit more problem than baseline. Periodic days of frequent bm for ~3-4 hrs but usually managable with daily loperamide  Energy a bit down. Remains active but is napping more  Appetite fine  Intermittent, uncommon right abd pain near ostomy. Mild and doesn't really bother him      MEDICAL HISTORY  1. Type I DM- diagnosed at age 51, on insulin     SOCIAL HISTORY  Prior Denies smoking and drugs. Drinks occasional alcohol. 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 He lives with his wife and two daughters age 54 and 74.     PHYSICAL EXAM  There were no vitals filed for this visit.    GENERAL: well developed,  well nourished, in no distress  PSYCH: full and appropriate range of affect with good insight and judgement  HEENT: NCAT, pupils equal, sclerae anicteric  SKIN: No rashes    OBJECTIVE DATA  Reviewed in Epic   CBC, CMP reviewed      RADIOLOGY RESULTS   Scans from yesterday (Scans personally reviewed by me and my interpretation as follows)  - slight decrease in presacral recurrence  - slight increase in the focus adjacent to ileostomy site (3.2 from 2.7 cm)  Chest celar

## 2022-01-06 ENCOUNTER — Ambulatory Visit: Admit: 2022-01-06 | Discharge: 2022-01-07 | Payer: PRIVATE HEALTH INSURANCE

## 2022-01-06 MED ADMIN — gadobenate dimeglumine (MULTIHANCE) 529 mg/mL (0.1mmol/0.2mL) solution 10 mL: 10 mL | INTRAVENOUS | @ 18:00:00 | Stop: 2022-01-06

## 2022-01-07 ENCOUNTER — Institutional Professional Consult (permissible substitution): Admit: 2022-01-07 | Discharge: 2022-01-07 | Payer: PRIVATE HEALTH INSURANCE

## 2022-01-07 ENCOUNTER — Ambulatory Visit
Admit: 2022-01-07 | Discharge: 2022-01-07 | Payer: PRIVATE HEALTH INSURANCE | Attending: Hematology & Oncology | Primary: Hematology & Oncology

## 2022-01-07 ENCOUNTER — Ambulatory Visit: Admit: 2022-01-07 | Discharge: 2022-01-07 | Payer: PRIVATE HEALTH INSURANCE

## 2022-01-07 DIAGNOSIS — C20 Malignant neoplasm of rectum: Principal | ICD-10-CM

## 2022-01-07 DIAGNOSIS — C787 Secondary malignant neoplasm of liver and intrahepatic bile duct: Principal | ICD-10-CM

## 2022-01-07 LAB — COMPREHENSIVE METABOLIC PANEL
ALBUMIN: 3.6 g/dL (ref 3.4–5.0)
ALKALINE PHOSPHATASE: 127 U/L — ABNORMAL HIGH (ref 46–116)
ALT (SGPT): 129 U/L — ABNORMAL HIGH (ref 10–49)
ANION GAP: 7 mmol/L (ref 5–14)
AST (SGOT): 55 U/L — ABNORMAL HIGH (ref <=34)
BILIRUBIN TOTAL: 0.6 mg/dL (ref 0.3–1.2)
BLOOD UREA NITROGEN: 19 mg/dL (ref 9–23)
BUN / CREAT RATIO: 20
CALCIUM: 8.8 mg/dL (ref 8.7–10.4)
CHLORIDE: 107 mmol/L (ref 98–107)
CO2: 27.3 mmol/L (ref 20.0–31.0)
CREATININE: 0.97 mg/dL
EGFR CKD-EPI (2021) MALE: >90 mL/min/{1.73_m2} (ref >=60)
GLUCOSE RANDOM: 177 mg/dL (ref 70–179)
POTASSIUM: 4.3 mmol/L (ref 3.4–4.8)
PROTEIN TOTAL: 6.3 g/dL (ref 5.7–8.2)
SODIUM: 141 mmol/L (ref 135–145)

## 2022-01-07 LAB — CBC W/ AUTO DIFF
BASOPHILS ABSOLUTE COUNT: 0 10*9/L (ref 0.0–0.1)
BASOPHILS RELATIVE PERCENT: 0.6 %
EOSINOPHILS ABSOLUTE COUNT: 0.1 10*9/L (ref 0.0–0.5)
EOSINOPHILS RELATIVE PERCENT: 2.4 %
HEMATOCRIT: 39.3 % (ref 39.0–48.0)
HEMOGLOBIN: 13.5 g/dL (ref 12.9–16.5)
LYMPHOCYTES ABSOLUTE COUNT: 1.1 10*9/L (ref 1.1–3.6)
LYMPHOCYTES RELATIVE PERCENT: 33 %
MEAN CORPUSCULAR HEMOGLOBIN CONC: 34.4 g/dL (ref 32.0–36.0)
MEAN CORPUSCULAR HEMOGLOBIN: 31.1 pg (ref 25.9–32.4)
MEAN CORPUSCULAR VOLUME: 90.4 fL (ref 77.6–95.7)
MEAN PLATELET VOLUME: 9.4 fL (ref 6.8–10.7)
MONOCYTES ABSOLUTE COUNT: 0.3 10*9/L (ref 0.3–0.8)
MONOCYTES RELATIVE PERCENT: 9.4 %
NEUTROPHILS ABSOLUTE COUNT: 1.9 10*9/L (ref 1.8–7.8)
NEUTROPHILS RELATIVE PERCENT: 54.6 %
NUCLEATED RED BLOOD CELLS: 0 /100{WBCs} (ref <=4)
PLATELET COUNT: 125 10*9/L — ABNORMAL LOW (ref 150–450)
RED BLOOD CELL COUNT: 4.34 10*12/L (ref 4.26–5.60)
RED CELL DISTRIBUTION WIDTH: 13 % (ref 12.2–15.2)
WBC ADJUSTED: 3.4 10*9/L — ABNORMAL LOW (ref 3.6–11.2)

## 2022-01-07 LAB — CEA: CARCINOEMBRYONIC ANTIGEN: 1.9 ng/mL (ref 0.0–5.0)

## 2022-01-07 MED ORDER — LOPERAMIDE 2 MG CAPSULE
ORAL_CAPSULE | 3 refills | 0 days | Status: CP
Start: 2022-01-07 — End: ?

## 2022-01-07 MED ADMIN — trastuzumab-dkst (OGIVRI) 630 mg in sodium chloride (NS) 0.9 % 250 mL IVPB: 6 mg/kg | INTRAVENOUS | @ 15:00:00 | Stop: 2022-01-07

## 2022-01-07 MED ADMIN — heparin, porcine (PF) 100 unit/mL injection 500 Units: 500 [IU] | INTRAVENOUS | @ 14:00:00 | Stop: 2022-01-07

## 2022-01-07 MED ADMIN — sodium chloride (NS) 0.9 % infusion: 100 mL/h | INTRAVENOUS | @ 15:00:00 | Stop: 2022-01-07

## 2022-01-07 MED ADMIN — heparin, porcine (PF) 100 unit/mL injection 500 Units: 500 [IU] | INTRAVENOUS | @ 16:00:00 | Stop: 2022-01-07

## 2022-01-07 NOTE — Unmapped (Signed)
Patient arrived in the infusion clinic at 0905. Vitals were obtained. Prior to infusion visit the patient had a nurse visit where their port was accessed, flushed with blood return, dressing clean, dry, and intact.  Labs were drawn and resulted within treatment plan parameters. No premedications ordered per treatment plan.  Patient received Trastuzumab chemotherapy regiment as ordered without complications while in the clinic.  Port was flushed with blood return, heparin locked, then de-accessed area covered with 2x2 gauze and ban-aid.  Patient declined printed after visit summary then discharged home to self care.

## 2022-01-19 NOTE — Unmapped (Signed)
[facility] Refill Coordination Note    Specialty Medication(s) to be Shipped:   Hematology/Oncology: [name] 150mg  and Tukysa 50 mg    Other medication(s) to be shipped: No additional medications requested for fill at this time     Max Stewart, DOB: 11-17-1970  Phone: [phone] (home) [phone] (work)      All above HIPAA information was verified with patient.     Was a Nurse, learning disability used for this call? No    Completed refill call assessment today to schedule patient's medication shipment from the [facility] [phone]).  All relevant notes have been reviewed.     Specialty medication(s) and dose(s) confirmed: Regimen is correct and unchanged.   Changes to medications: Kaiven reports no changes at this time.  Changes to insurance: No  New side effects reported not previously addressed with a pharmacist or physician: None reported  Questions for the pharmacist: No    Confirmed patient received a Conservation officer, historic buildings and a Surveyor, mining with first shipment. The patient will receive a drug information handout for each medication shipped and additional FDA Medication Guides as required.       DISEASE/MEDICATION-SPECIFIC INFORMATION        N/A    SPECIALTY MEDICATION ADHERENCE     Medication Adherence    Patient reported X missed doses in the last month: 0  Specialty Medication: Tukysa 50 mg  Patient is on additional specialty medications: Yes  Additional Specialty Medications: Tukysa 150 mg  Patient Reported Additional Medication X Missed Doses in the Last Month: 0  Patient is on more than two specialty medications: No  Informant: patient                                Were doses missed due to medication being on hold? No    Tukysa 150 mg: 7 days of medicine on hand   Tukysa 50 mg: 7 days of medicine on hand       REFERRAL TO PHARMACIST     Referral to the pharmacist: Not needed      [facility]     Shipping address confirmed in Epic.     Delivery Scheduled: Yes, Expected medication delivery date: 01/22/22.     Medication will be delivered via Next Day Courier to the prescription address in Epic [place].    Quintella Reichert   [facility] Specialty Technician

## 2022-01-21 MED FILL — TUKYSA 150 MG TABLET: ORAL | 30 days supply | Qty: 60 | Fill #1

## 2022-01-21 MED FILL — TUKYSA 50 MG TABLET: ORAL | 30 days supply | Qty: 120 | Fill #2

## 2022-01-28 ENCOUNTER — Ambulatory Visit: Admit: 2022-01-28 | Discharge: 2022-01-29 | Payer: PRIVATE HEALTH INSURANCE

## 2022-01-28 ENCOUNTER — Institutional Professional Consult (permissible substitution): Admit: 2022-01-28 | Discharge: 2022-01-29 | Payer: PRIVATE HEALTH INSURANCE

## 2022-01-28 DIAGNOSIS — C20 Malignant neoplasm of rectum: Principal | ICD-10-CM

## 2022-01-28 LAB — CBC W/ AUTO DIFF
BASOPHILS ABSOLUTE COUNT: 0 10*9/L (ref 0.0–0.1)
BASOPHILS RELATIVE PERCENT: 0.9 %
EOSINOPHILS ABSOLUTE COUNT: 0.1 10*9/L (ref 0.0–0.5)
EOSINOPHILS RELATIVE PERCENT: 2 %
HEMATOCRIT: 44 % (ref 39.0–48.0)
HEMOGLOBIN: 14.9 g/dL (ref 12.9–16.5)
LYMPHOCYTES ABSOLUTE COUNT: 1.2 10*9/L (ref 1.1–3.6)
LYMPHOCYTES RELATIVE PERCENT: 30.4 %
MEAN CORPUSCULAR HEMOGLOBIN CONC: 33.9 g/dL (ref 32.0–36.0)
MEAN CORPUSCULAR HEMOGLOBIN: 30.3 pg (ref 25.9–32.4)
MEAN CORPUSCULAR VOLUME: 89.4 fL (ref 77.6–95.7)
MEAN PLATELET VOLUME: 9.3 fL (ref 6.8–10.7)
MONOCYTES ABSOLUTE COUNT: 0.3 10*9/L (ref 0.3–0.8)
MONOCYTES RELATIVE PERCENT: 6.9 %
NEUTROPHILS ABSOLUTE COUNT: 2.3 10*9/L (ref 1.8–7.8)
NEUTROPHILS RELATIVE PERCENT: 59.8 %
NUCLEATED RED BLOOD CELLS: 0 /100{WBCs} (ref <=4)
PLATELET COUNT: 149 10*9/L — ABNORMAL LOW (ref 150–450)
RED BLOOD CELL COUNT: 4.93 10*12/L (ref 4.26–5.60)
RED CELL DISTRIBUTION WIDTH: 12.9 % (ref 12.2–15.2)
WBC ADJUSTED: 3.8 10*9/L (ref 3.6–11.2)

## 2022-01-28 LAB — COMPREHENSIVE METABOLIC PANEL
ALBUMIN: 3.7 g/dL (ref 3.4–5.0)
ALKALINE PHOSPHATASE: 134 U/L — ABNORMAL HIGH (ref 46–116)
ALT (SGPT): 135 U/L — ABNORMAL HIGH (ref 10–49)
ANION GAP: 7 mmol/L (ref 5–14)
AST (SGOT): 55 U/L — ABNORMAL HIGH (ref <=34)
BILIRUBIN TOTAL: 0.5 mg/dL (ref 0.3–1.2)
BLOOD UREA NITROGEN: 13 mg/dL (ref 9–23)
BUN / CREAT RATIO: 13
CALCIUM: 8.9 mg/dL (ref 8.7–10.4)
CHLORIDE: 108 mmol/L — ABNORMAL HIGH (ref 98–107)
CO2: 26.3 mmol/L (ref 20.0–31.0)
CREATININE: 0.97 mg/dL
EGFR CKD-EPI (2021) MALE: >90 mL/min/{1.73_m2} (ref >=60)
GLUCOSE RANDOM: 138 mg/dL (ref 70–179)
POTASSIUM: 4.2 mmol/L (ref 3.4–4.8)
PROTEIN TOTAL: 6.3 g/dL (ref 5.7–8.2)
SODIUM: 141 mmol/L (ref 135–145)

## 2022-01-28 LAB — CEA: CARCINOEMBRYONIC ANTIGEN: 1.5 ng/mL (ref 0.0–5.0)

## 2022-01-28 MED ADMIN — sodium chloride (NS) 0.9 % infusion: 100 mL/h | INTRAVENOUS | @ 18:00:00 | Stop: 2022-01-28

## 2022-01-28 MED ADMIN — heparin, porcine (PF) 100 unit/mL injection 500 Units: 500 [IU] | INTRAVENOUS | @ 18:00:00 | Stop: 2022-01-28

## 2022-01-28 MED ADMIN — heparin, porcine (PF) 100 unit/mL injection 500 Units: 500 [IU] | INTRAVENOUS | @ 17:00:00 | Stop: 2022-01-28

## 2022-01-28 MED ADMIN — trastuzumab-dkst (OGIVRI) 630 mg in sodium chloride (NS) 0.9 % 250 mL IVPB: 6 mg/kg | INTRAVENOUS | @ 18:00:00 | Stop: 2022-01-28

## 2022-01-28 NOTE — Unmapped (Signed)
Patient arrived in the infusion clinic at 1200.  Weight and vitals were obtained. Port was accessed during clinic nurse visit. RN flushed with NS, brisk blood return noted. Dressing clean, dry, and intact.  Labs were drawn  during clinic nurse visit and resulted within treatment plan parameters. Patient was administered trastuzumab as ordered. Infusion running without complications at time of handoff report to M.Stanfield RN at 1300.

## 2022-01-28 NOTE — Unmapped (Signed)
Clinical Support on 01/28/2022   Component Date Value Ref Range Status    Sodium 01/28/2022 141  135 - 145 mmol/L Final    Potassium 01/28/2022 4.2  3.4 - 4.8 mmol/L Final    Chloride 01/28/2022 108 (H)  98 - 107 mmol/L Final    CO2 01/28/2022 26.3  20.0 - 31.0 mmol/L Final    Anion Gap 01/28/2022 7  5 - 14 mmol/L Final    BUN 01/28/2022 13  9 - 23 mg/dL Final    Creatinine 09/81/1914 0.97  0.73 - 1.18 mg/dL Final    BUN/Creatinine Ratio 01/28/2022 13   Final    eGFR CKD-EPI (2021) Male 01/28/2022 >90  >=60 mL/min/1.77m2 Final    eGFR calculated with CKD-EPI 2021 equation in accordance with [employer] and [employer] of Nephrology Task Force recommendations.    Glucose 01/28/2022 138  70 - 179 mg/dL Final    Calcium 78/29/5621 8.9  8.7 - 10.4 mg/dL Final    Albumin 30/86/5784 3.7  3.4 - 5.0 g/dL Final    Total Protein 01/28/2022 6.3  5.7 - 8.2 g/dL Final    Total Bilirubin 01/28/2022 0.5  0.3 - 1.2 mg/dL Final    AST 69/62/9528 55 (H)  <=34 U/L Final    ALT 01/28/2022 135 (H)  10 - 49 U/L Final    Alkaline Phosphatase 01/28/2022 134 (H)  46 - 116 U/L Final    WBC 01/28/2022 3.8  3.6 - 11.2 10*9/L Final    RBC 01/28/2022 4.93  4.26 - 5.60 10*12/L Final    HGB 01/28/2022 14.9  12.9 - 16.5 g/dL Final    HCT 41/32/4401 44.0  39.0 - 48.0 % Final    MCV 01/28/2022 89.4  77.6 - 95.7 fL Final    MCH 01/28/2022 30.3  25.9 - 32.4 pg Final    MCHC 01/28/2022 33.9  32.0 - 36.0 g/dL Final    RDW 02/72/5366 12.9  12.2 - 15.2 % Final    MPV 01/28/2022 9.3  6.8 - 10.7 fL Final    Platelet 01/28/2022 149 (L)  150 - 450 10*9/L Final    nRBC 01/28/2022 0  <=4 /100 WBCs Final    Neutrophils % 01/28/2022 59.8  % Final    Lymphocytes % 01/28/2022 30.4  % Final    Monocytes % 01/28/2022 6.9  % Final    Eosinophils % 01/28/2022 2.0  % Final    Basophils % 01/28/2022 0.9  % Final    Absolute Neutrophils 01/28/2022 2.3  1.8 - 7.8 10*9/L Final    Absolute Lymphocytes 01/28/2022 1.2  1.1 - 3.6 10*9/L Final    Absolute Monocytes 01/28/2022 0.3  0.3 - 0.8 10*9/L Final    Absolute Eosinophils 01/28/2022 0.1  0.0 - 0.5 10*9/L Final    Absolute Basophils 01/28/2022 0.0  0.0 - 0.1 10*9/L Final

## 2022-02-12 NOTE — Unmapped (Signed)
[facility] Refill Coordination Note    Specialty Medication(s) to be Shipped:   Hematology/Oncology: Max Stewart 150mg  and Tukysa 50 mg  ((DENIED))    Other medication(s) to be shipped: No additional medications requested for fill at this time     Max Stewart, DOB: 06-23-1970  Phone: [phone] (home) [phone] (work)      All above HIPAA information was verified with patient.     Was a Nurse, learning disability used for this call? No    Completed refill call assessment today to schedule patient's medication shipment from the [facility] [phone]).  All relevant notes have been reviewed.     Specialty medication(s) and dose(s) confirmed: Regimen is correct and unchanged.   Changes to medications: [name] reports no changes at this time.  Changes to insurance: No  New side effects reported not previously addressed with a pharmacist or physician: None reported  Questions for the pharmacist: No    Confirmed patient received a Conservation officer, historic buildings and a Surveyor, mining with first shipment. The patient will receive a drug information handout for each medication shipped and additional FDA Medication Guides as required.       DISEASE/MEDICATION-SPECIFIC INFORMATION        N/A    SPECIALTY MEDICATION ADHERENCE     Medication Adherence    Patient reported X missed doses in the last month: 2  Specialty Medication: Tukysa 50 mg  Patient is on additional specialty medications: Yes  Additional Specialty Medications: Tukysa 150 mg  Patient Reported Additional Medication X Missed Doses in the Last Month: 2  Patient is on more than two specialty medications: No  Informant: patient                       Were doses missed due to medication being on hold? No    Tukysa 150 mg: 27 days of medicine on hand   Tukysa 50 mg: 30 days of medicine on hand       REFERRAL TO PHARMACIST     Referral to the pharmacist: Not needed      [facility]     Shipping address confirmed in Epic.     Delivery Scheduled: Patient declined refill at this time due to has a month left of medication.     Medication will be delivered via Next Day Courier to the prescription address in Epic [place].    Max Stewart   [facility] Specialty Technician

## 2022-02-17 NOTE — Unmapped (Signed)
[facility] GI MEDICAL ONCOLOGY     PRIMARY CARE PROVIDER:  Pcp, None Per Patient  [address] [place] [address]    CONSULTING PROVIDERS  Dr. Rayetta Humphrey - Radiation Oncology  Dr. Rico Junker, [facility] Surgical Oncology   Dr. 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 FOLFOX x 8  05/2018 chemoradiotherapy to primary.   -Clinical complete response in liver and rectum, managed with Watch and Wait approach  03/2019 recurrent disease in rectum   05/2019 proctectomy with coloanal anastomosis, liver wedge resection (seg 3): liver with fibrosis, rectum invasive mod to poorly differentiated adenocarcinoma, 3 cm; into muscularis propria, no LVI, tumor at distal margin in area of tearing superceded by additional specimen, 0/16 nodes, two with prominent treatment effect. YpT2N0.  - complicated by abdominal abscess with MRSA  08/02/19 closure of diverting ileostomy  02/2020 2 sites of recurrence disease in liver adjacent to GB fossa  04/14/20 seg 4b wedge resection: two foci of adenocarcinoma c/w colorectal primary. Margin +; node negative   01/2021 SBRT 5000cGy in 6fx to met near GB fossa  06/2021 -09/2021 pelvic recurrence, FOLFOX x 6  11/05/2021 tucatinib-trastuzumab start [baseline echo nl]  - c1 LFTs 4x ULN. Improved quickly, resumed at tucatinib 250 bid    MOLECULAR PROFILE   TEMPUS from 06/2021 rectal mass biopsy:  HER2 amplified  TP53 and APC mutation  RAS/RAF WT  TMB 6.3, MSS    Treatment plan:  tucatinib + trastuzumab. Consider metastectomy q6-64m pending response    ___________________________________________________________________    ASSESSMENT  1. 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 Type I DM- controlled on insulin pump, no complications from DM  3. Elevated transaminases, now normal today  4. ECOG 0    RECOMMENDATIONS  1. Tucatinib 250 mg BID + trastuzumab.     2. RTC in 3 weeks for treatment, 6 weeks for MRI, labs, visit and treatment    DISCUSSION  Max Stewart is doing well on treatment.  Feels he is managing side effects from treatment and tolerating well.  Continue same treatment plan.      Medical decision making based high complexity of cancer and treatment risk of complications and monitoring and management of toxicity of treatment.   ______________________________________________________________    Interval History  Max Stewart returns today for ongoing management of his rectal cancer with liver mets.    Comes in feeling good.  No new concerning symptoms.  +fatigue, feels he is managing well, staying active.  Naps when he needs to and can feel better.  Diarrhea, well managed, has good and bad days.  Uses prn imodium.  Appetite is good, no nausea.  Intermittent, uncommon right abd pain near ostomy. Notices if he bends or moves a certain way, not worse, doesn't feel like he needs to take any meds for.    MEDICAL HISTORY  1. Type I DM- diagnosed at age 30, on insulin     SOCIAL HISTORY  Prior Denies smoking and drugs. Drinks occasional alcohol. 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 He lives with his wife and two daughters age 22 and 89.     PHYSICAL EXAM  Vitals:    02/18/22 0852   BP: 121/72   Pulse: 68  Resp: 20   Temp: 36.2 ??C (97.1 ??F)   SpO2: 98%       GENERAL: well developed, well nourished, in no distress  PSYCH: full and appropriate range of affect with good insight and judgement  HEENT: NCAT, pupils equal, sclerae anicteric  SKIN: No rashes    OBJECTIVE DATA  Reviewed in Epic   CBC, CMP reviewed    LFTs now wnl    RADIOLOGY RESULTS   None today

## 2022-02-18 ENCOUNTER — Ambulatory Visit: Admit: 2022-02-18 | Discharge: 2022-02-18 | Payer: PRIVATE HEALTH INSURANCE

## 2022-02-18 ENCOUNTER — Institutional Professional Consult (permissible substitution): Admit: 2022-02-18 | Discharge: 2022-02-18 | Payer: PRIVATE HEALTH INSURANCE

## 2022-02-18 DIAGNOSIS — E109 Type 1 diabetes mellitus without complications: Principal | ICD-10-CM

## 2022-02-18 DIAGNOSIS — C20 Malignant neoplasm of rectum: Principal | ICD-10-CM

## 2022-02-18 DIAGNOSIS — C19 Malignant neoplasm of rectosigmoid junction: Principal | ICD-10-CM

## 2022-02-18 DIAGNOSIS — R7401 Elevated transaminase measurement: Principal | ICD-10-CM

## 2022-02-18 DIAGNOSIS — Z79899 Other long term (current) drug therapy: Principal | ICD-10-CM

## 2022-02-18 LAB — COMPREHENSIVE METABOLIC PANEL
ALBUMIN: 3.6 g/dL (ref 3.4–5.0)
ALKALINE PHOSPHATASE: 110 U/L (ref 46–116)
ALT (SGPT): 48 U/L (ref 10–49)
ANION GAP: 8 mmol/L (ref 5–14)
AST (SGOT): 33 U/L (ref <=34)
BILIRUBIN TOTAL: 0.8 mg/dL (ref 0.3–1.2)
BLOOD UREA NITROGEN: 21 mg/dL (ref 9–23)
BUN / CREAT RATIO: 21
CALCIUM: 8.7 mg/dL (ref 8.7–10.4)
CHLORIDE: 110 mmol/L — ABNORMAL HIGH (ref 98–107)
CO2: 26.5 mmol/L (ref 20.0–31.0)
CREATININE: 1.02 mg/dL
EGFR CKD-EPI (2021) MALE: 88 mL/min/{1.73_m2} (ref >=60)
GLUCOSE RANDOM: 151 mg/dL (ref 70–179)
POTASSIUM: 4.1 mmol/L (ref 3.4–4.8)
PROTEIN TOTAL: 6.1 g/dL (ref 5.7–8.2)
SODIUM: 144 mmol/L (ref 135–145)

## 2022-02-18 LAB — CBC W/ AUTO DIFF
BASOPHILS ABSOLUTE COUNT: 0 10*9/L (ref 0.0–0.1)
BASOPHILS RELATIVE PERCENT: 0.7 %
EOSINOPHILS ABSOLUTE COUNT: 0.1 10*9/L (ref 0.0–0.5)
EOSINOPHILS RELATIVE PERCENT: 2.2 %
HEMATOCRIT: 41.9 % (ref 39.0–48.0)
HEMOGLOBIN: 14.4 g/dL (ref 12.9–16.5)
LYMPHOCYTES ABSOLUTE COUNT: 1.2 10*9/L (ref 1.1–3.6)
LYMPHOCYTES RELATIVE PERCENT: 32.6 %
MEAN CORPUSCULAR HEMOGLOBIN CONC: 34.4 g/dL (ref 32.0–36.0)
MEAN CORPUSCULAR HEMOGLOBIN: 30.3 pg (ref 25.9–32.4)
MEAN CORPUSCULAR VOLUME: 88 fL (ref 77.6–95.7)
MEAN PLATELET VOLUME: 9.4 fL (ref 6.8–10.7)
MONOCYTES ABSOLUTE COUNT: 0.4 10*9/L (ref 0.3–0.8)
MONOCYTES RELATIVE PERCENT: 10.3 %
NEUTROPHILS ABSOLUTE COUNT: 1.9 10*9/L (ref 1.8–7.8)
NEUTROPHILS RELATIVE PERCENT: 54.2 %
NUCLEATED RED BLOOD CELLS: 0 /100{WBCs} (ref <=4)
PLATELET COUNT: 135 10*9/L — ABNORMAL LOW (ref 150–450)
RED BLOOD CELL COUNT: 4.76 10*12/L (ref 4.26–5.60)
RED CELL DISTRIBUTION WIDTH: 13.4 % (ref 12.2–15.2)
WBC ADJUSTED: 3.6 10*9/L (ref 3.6–11.2)

## 2022-02-18 LAB — CEA: CARCINOEMBRYONIC ANTIGEN: 1.8 ng/mL (ref 0.0–5.0)

## 2022-02-18 MED ADMIN — sodium chloride (NS) 0.9 % infusion: 100 mL/h | INTRAVENOUS | @ 15:00:00 | Stop: 2022-02-18

## 2022-02-18 MED ADMIN — heparin, porcine (PF) 100 unit/mL injection 500 Units: 500 [IU] | INTRAVENOUS | @ 14:00:00 | Stop: 2022-02-18

## 2022-02-18 MED ADMIN — trastuzumab-dkst (OGIVRI) 630 mg in sodium chloride (NS) 0.9 % 250 mL IVPB: 6 mg/kg | INTRAVENOUS | @ 15:00:00 | Stop: 2022-02-18

## 2022-02-18 MED ADMIN — heparin, porcine (PF) 100 unit/mL injection 500 Units: 500 [IU] | INTRAVENOUS | @ 16:00:00 | Stop: 2022-02-18

## 2022-02-18 NOTE — Unmapped (Signed)
Patient arrived in the infusion clinic at [facility].  Weight and vitals were obtained. Port was accessed during clinic nurse visit. RN flushed with NS, brisk blood return noted. Dressing clean, dry, and intact.  Labs were drawn  during clinic nurse visit and resulted within treatment plan parameters.  Patient was administered trastuzumab as ordered without complications while in the clinic. Port with brisk blood return following infusion. Flushed with NS and heparin per protocol and deaccessed. Gauze and bandaid placed over site. Patient declined after visit summary then discharged home to self care.

## 2022-02-18 NOTE — Unmapped (Signed)
Clinical Support on 02/18/2022   Component Date Value Ref Range Status    Sodium 02/18/2022 144  135 - 145 mmol/L Final    Potassium 02/18/2022 4.1  3.4 - 4.8 mmol/L Final    Chloride 02/18/2022 110 (H)  98 - 107 mmol/L Final    CO2 02/18/2022 26.5  20.0 - 31.0 mmol/L Final    Anion Gap 02/18/2022 8  5 - 14 mmol/L Final    BUN 02/18/2022 21  9 - 23 mg/dL Final    Creatinine 29/56/2130 1.02  0.73 - 1.18 mg/dL Final    BUN/Creatinine Ratio 02/18/2022 21   Final    eGFR CKD-EPI (2021) Male 02/18/2022 88  >=60 mL/min/1.58m2 Final    eGFR calculated with CKD-EPI 2021 equation in accordance with [employer] and [employer] of Nephrology Task Force recommendations.    Glucose 02/18/2022 151  70 - 179 mg/dL Final    Calcium 86/57/8469 8.7  8.7 - 10.4 mg/dL Final    Albumin 62/95/2841 3.6  3.4 - 5.0 g/dL Final    Total Protein 02/18/2022 6.1  5.7 - 8.2 g/dL Final    Total Bilirubin 02/18/2022 0.8  0.3 - 1.2 mg/dL Final    AST 32/44/0102 33  <=34 U/L Final    ALT 02/18/2022 48  10 - 49 U/L Final    Alkaline Phosphatase 02/18/2022 110  46 - 116 U/L Final    CEA 02/18/2022 1.8  0.0 - 5.0 ng/mL Final    WBC 02/18/2022 3.6  3.6 - 11.2 10*9/L Final    RBC 02/18/2022 4.76  4.26 - 5.60 10*12/L Final    HGB 02/18/2022 14.4  12.9 - 16.5 g/dL Final    HCT 72/53/6644 41.9  39.0 - 48.0 % Final    MCV 02/18/2022 88.0  77.6 - 95.7 fL Final    MCH 02/18/2022 30.3  25.9 - 32.4 pg Final    MCHC 02/18/2022 34.4  32.0 - 36.0 g/dL Final    RDW 03/47/4259 13.4  12.2 - 15.2 % Final    MPV 02/18/2022 9.4  6.8 - 10.7 fL Final    Platelet 02/18/2022 135 (L)  150 - 450 10*9/L Final    nRBC 02/18/2022 0  <=4 /100 WBCs Final    Neutrophils % 02/18/2022 54.2  % Final    Lymphocytes % 02/18/2022 32.6  % Final    Monocytes % 02/18/2022 10.3  % Final    Eosinophils % 02/18/2022 2.2  % Final    Basophils % 02/18/2022 0.7  % Final    Absolute Neutrophils 02/18/2022 1.9  1.8 - 7.8 10*9/L Final    Absolute Lymphocytes 02/18/2022 1.2  1.1 - 3.6 10*9/L Final    Absolute Monocytes 02/18/2022 0.4  0.3 - 0.8 10*9/L Final    Absolute Eosinophils 02/18/2022 0.1  0.0 - 0.5 10*9/L Final    Absolute Basophils 02/18/2022 0.0  0.0 - 0.1 10*9/L Final

## 2022-03-04 NOTE — Unmapped (Signed)
[facility] Refill Coordination Note    Specialty Medication(s) to be Shipped:   Hematology/Oncology: Max Stewart 150mg  and Tukysa 50 mg      Other medication(s) to be shipped: No additional medications requested for fill at this time     Max Stewart, DOB: [birth year]  Phone: [phone] (home) [phone] (work)      All above HIPAA information was verified with patient.     Was a Nurse, learning disability used for this call? No    Completed refill call assessment today to schedule patient's medication shipment from the [facility] [phone]).  All relevant notes have been reviewed.     Specialty medication(s) and dose(s) confirmed: Regimen is correct and unchanged.   Changes to medications: Max Stewart reports no changes at this time.  Changes to insurance: No  New side effects reported not previously addressed with a pharmacist or physician: None reported  Questions for the pharmacist: No    Confirmed patient received a Conservation officer, historic buildings and a Surveyor, mining with first shipment. The patient will receive a drug information handout for each medication shipped and additional FDA Medication Guides as required.       DISEASE/MEDICATION-SPECIFIC INFORMATION        N/A    SPECIALTY MEDICATION ADHERENCE     Medication Adherence    Patient reported X missed doses in the last month: 0  Specialty Medication: Tukysa 50 mg  Patient is on additional specialty medications: Yes  Additional Specialty Medications: Tukysa 150 mg  Patient Reported Additional Medication X Missed Doses in the Last Month: 0  Patient is on more than two specialty medications: No  Informant: patient                       Were doses missed due to medication being on hold? No    Tukysa 150 mg: 7 days of medicine on hand   Tukysa 50 mg: 10 days of medicine on hand       REFERRAL TO PHARMACIST     Referral to the pharmacist: Not needed      [facility]     Shipping address confirmed in Epic.     Delivery Scheduled: Yes, Expected medication delivery date: 03/09/22.     Medication will be delivered via Next Day Courier to the prescription address in Epic [place].    [name]   [facility] Specialty Technician

## 2022-03-05 DIAGNOSIS — C20 Malignant neoplasm of rectum: Principal | ICD-10-CM

## 2022-03-08 MED FILL — TUKYSA 150 MG TABLET: ORAL | 30 days supply | Qty: 60 | Fill #2

## 2022-03-08 MED FILL — TUKYSA 50 MG TABLET: ORAL | 30 days supply | Qty: 120 | Fill #3

## 2022-03-11 ENCOUNTER — Institutional Professional Consult (permissible substitution): Admit: 2022-03-11 | Discharge: 2022-03-12 | Payer: PRIVATE HEALTH INSURANCE

## 2022-03-11 ENCOUNTER — Ambulatory Visit: Admit: 2022-03-11 | Discharge: 2022-03-12 | Payer: PRIVATE HEALTH INSURANCE

## 2022-03-11 DIAGNOSIS — C20 Malignant neoplasm of rectum: Principal | ICD-10-CM

## 2022-03-11 LAB — CBC W/ AUTO DIFF
BASOPHILS ABSOLUTE COUNT: 0 10*9/L (ref 0.0–0.1)
BASOPHILS RELATIVE PERCENT: 0.8 %
EOSINOPHILS ABSOLUTE COUNT: 0 10*9/L (ref 0.0–0.5)
EOSINOPHILS RELATIVE PERCENT: 1 %
HEMATOCRIT: 43.4 % (ref 39.0–48.0)
HEMOGLOBIN: 14.9 g/dL (ref 12.9–16.5)
LYMPHOCYTES ABSOLUTE COUNT: 1.2 10*9/L (ref 1.1–3.6)
LYMPHOCYTES RELATIVE PERCENT: 28.9 %
MEAN CORPUSCULAR HEMOGLOBIN CONC: 34.4 g/dL (ref 32.0–36.0)
MEAN CORPUSCULAR HEMOGLOBIN: 30.4 pg (ref 25.9–32.4)
MEAN CORPUSCULAR VOLUME: 88.4 fL (ref 77.6–95.7)
MEAN PLATELET VOLUME: 9.4 fL (ref 6.8–10.7)
MONOCYTES ABSOLUTE COUNT: 0.3 10*9/L (ref 0.3–0.8)
MONOCYTES RELATIVE PERCENT: 7.1 %
NEUTROPHILS ABSOLUTE COUNT: 2.6 10*9/L (ref 1.8–7.8)
NEUTROPHILS RELATIVE PERCENT: 62.2 %
NUCLEATED RED BLOOD CELLS: 0 /100{WBCs} (ref <=4)
PLATELET COUNT: 157 10*9/L (ref 150–450)
RED BLOOD CELL COUNT: 4.91 10*12/L (ref 4.26–5.60)
RED CELL DISTRIBUTION WIDTH: 13.8 % (ref 12.2–15.2)
WBC ADJUSTED: 4.3 10*9/L (ref 3.6–11.2)

## 2022-03-11 LAB — COMPREHENSIVE METABOLIC PANEL
ALBUMIN: 3.8 g/dL (ref 3.4–5.0)
ALKALINE PHOSPHATASE: 151 U/L — ABNORMAL HIGH (ref 46–116)
ALT (SGPT): 142 U/L — ABNORMAL HIGH (ref 10–49)
ANION GAP: 5 mmol/L (ref 5–14)
AST (SGOT): 46 U/L — ABNORMAL HIGH (ref <=34)
BILIRUBIN TOTAL: 1.1 mg/dL (ref 0.3–1.2)
BLOOD UREA NITROGEN: 21 mg/dL (ref 9–23)
BUN / CREAT RATIO: 23
CALCIUM: 8.7 mg/dL (ref 8.7–10.4)
CHLORIDE: 105 mmol/L (ref 98–107)
CO2: 28.4 mmol/L (ref 20.0–31.0)
CREATININE: 0.92 mg/dL
EGFR CKD-EPI (2021) MALE: >90 mL/min/{1.73_m2} (ref >=60)
GLUCOSE RANDOM: 242 mg/dL — ABNORMAL HIGH (ref 70–179)
POTASSIUM: 4.4 mmol/L (ref 3.4–4.8)
PROTEIN TOTAL: 6.6 g/dL (ref 5.7–8.2)
SODIUM: 138 mmol/L (ref 135–145)

## 2022-03-11 LAB — CEA: CARCINOEMBRYONIC ANTIGEN: 1.7 ng/mL (ref 0.0–5.0)

## 2022-03-11 MED ADMIN — sodium chloride (NS) 0.9 % infusion: 100 mL/h | INTRAVENOUS | @ 19:00:00 | Stop: 2022-03-11

## 2022-03-11 MED ADMIN — trastuzumab-dkst (OGIVRI) 630 mg in sodium chloride (NS) 0.9 % 250 mL IVPB: 6 mg/kg | INTRAVENOUS | @ 19:00:00 | Stop: 2022-03-11

## 2022-03-11 MED ADMIN — heparin, porcine (PF) 100 unit/mL injection 500 Units: 500 [IU] | INTRAVENOUS | @ 17:00:00 | Stop: 2022-03-11

## 2022-03-11 MED ADMIN — heparin, porcine (PF) 100 unit/mL injection 500 Units: 500 [IU] | INTRAVENOUS | @ 19:00:00 | Stop: 2022-03-11

## 2022-03-11 NOTE — Unmapped (Signed)
Patient arrived to infusion, in stable condition, for trastuzumab infusion. Port already accessed and labs collected in earlier nurse visit. Labs reviewed. No hold parameters or pre meds. Drug released to pharmacy. Infusion administered as ordered. Patient tolerated well. No adverse reactions noted. AVS declined. Port flushed, hep locked, and deaccessed per protocol. Patient discharged home to self care, in stable condition.

## 2022-03-26 NOTE — Unmapped (Signed)
[facility] Refill Coordination Note    Specialty Medication(s) to be Shipped:   Hematology/Oncology: [name] 150mg  and Tukysa 50 mg      Other medication(s) to be shipped: No additional medications requested for fill at this time     Max Stewart, DOB: [birth year]  Phone: [phone] (home) [phone] (work)      All above HIPAA information was verified with patient.     Was a Nurse, learning disability used for this call? No    Completed refill call assessment today to schedule patient's medication shipment from the [facility] [phone]).  All relevant notes have been reviewed.     Specialty medication(s) and dose(s) confirmed: Regimen is correct and unchanged.   Changes to medications: [name] reports no changes at this time.  Changes to insurance: No  New side effects reported not previously addressed with a pharmacist or physician: None reported  Questions for the pharmacist: No    Confirmed patient received a Conservation officer, historic buildings and a Surveyor, mining with first shipment. The patient will receive a drug information handout for each medication shipped and additional FDA Medication Guides as required.       DISEASE/MEDICATION-SPECIFIC INFORMATION        N/A    SPECIALTY MEDICATION ADHERENCE     Medication Adherence    Patient reported X missed doses in the last month: 0  Specialty Medication: Tukysa 50 mg  Patient is on additional specialty medications: Yes  Additional Specialty Medications: Tukysa 150 mg  Patient Reported Additional Medication X Missed Doses in the Last Month: 0  Patient is on more than two specialty medications: No  Informant: patient     Were doses missed due to medication being on hold? No    Tukysa 150 mg: 14 days of medicine on hand   Tukysa 50 mg: 14 days of medicine on hand       REFERRAL TO PHARMACIST     Referral to the pharmacist: Not needed      [facility]     Shipping address confirmed in Epic.     Delivery Scheduled: Yes, Expected medication delivery date: 04/08/22.     Medication will be delivered via Next Day Courier to the prescription address in Epic [place].    [name]   [facility] Specialty Technician

## 2022-04-01 ENCOUNTER — Ambulatory Visit
Admit: 2022-04-01 | Discharge: 2022-04-01 | Payer: PRIVATE HEALTH INSURANCE | Attending: Hematology & Oncology | Primary: Hematology & Oncology

## 2022-04-01 ENCOUNTER — Institutional Professional Consult (permissible substitution): Admit: 2022-04-01 | Discharge: 2022-04-01 | Payer: PRIVATE HEALTH INSURANCE

## 2022-04-01 ENCOUNTER — Ambulatory Visit: Admit: 2022-04-01 | Discharge: 2022-04-01 | Payer: PRIVATE HEALTH INSURANCE

## 2022-04-01 DIAGNOSIS — C19 Malignant neoplasm of rectosigmoid junction: Principal | ICD-10-CM

## 2022-04-01 DIAGNOSIS — C20 Malignant neoplasm of rectum: Principal | ICD-10-CM

## 2022-04-01 LAB — CBC W/ AUTO DIFF
BASOPHILS ABSOLUTE COUNT: 0 10*9/L (ref 0.0–0.1)
BASOPHILS RELATIVE PERCENT: 0.6 %
EOSINOPHILS ABSOLUTE COUNT: 0.1 10*9/L (ref 0.0–0.5)
EOSINOPHILS RELATIVE PERCENT: 2.2 %
HEMATOCRIT: 42.5 % (ref 39.0–48.0)
HEMOGLOBIN: 14.3 g/dL (ref 12.9–16.5)
LYMPHOCYTES ABSOLUTE COUNT: 1.3 10*9/L (ref 1.1–3.6)
LYMPHOCYTES RELATIVE PERCENT: 26.4 %
MEAN CORPUSCULAR HEMOGLOBIN CONC: 33.7 g/dL (ref 32.0–36.0)
MEAN CORPUSCULAR HEMOGLOBIN: 30.2 pg (ref 25.9–32.4)
MEAN CORPUSCULAR VOLUME: 89.5 fL (ref 77.6–95.7)
MEAN PLATELET VOLUME: 9.7 fL (ref 6.8–10.7)
MONOCYTES ABSOLUTE COUNT: 0.4 10*9/L (ref 0.3–0.8)
MONOCYTES RELATIVE PERCENT: 7.4 %
NEUTROPHILS ABSOLUTE COUNT: 3 10*9/L (ref 1.8–7.8)
NEUTROPHILS RELATIVE PERCENT: 63.4 %
NUCLEATED RED BLOOD CELLS: 0 /100{WBCs} (ref <=4)
PLATELET COUNT: 150 10*9/L (ref 150–450)
RED BLOOD CELL COUNT: 4.75 10*12/L (ref 4.26–5.60)
RED CELL DISTRIBUTION WIDTH: 13.3 % (ref 12.2–15.2)
WBC ADJUSTED: 4.8 10*9/L (ref 3.6–11.2)

## 2022-04-01 LAB — COMPREHENSIVE METABOLIC PANEL
ALBUMIN: 3.7 g/dL (ref 3.4–5.0)
ALKALINE PHOSPHATASE: 134 U/L — ABNORMAL HIGH (ref 46–116)
ALT (SGPT): 85 U/L — ABNORMAL HIGH (ref 10–49)
ANION GAP: 4 mmol/L — ABNORMAL LOW (ref 5–14)
AST (SGOT): 48 U/L — ABNORMAL HIGH (ref <=34)
BILIRUBIN TOTAL: 0.9 mg/dL (ref 0.3–1.2)
BLOOD UREA NITROGEN: 17 mg/dL (ref 9–23)
BUN / CREAT RATIO: 17
CALCIUM: 8.4 mg/dL — ABNORMAL LOW (ref 8.7–10.4)
CHLORIDE: 108 mmol/L — ABNORMAL HIGH (ref 98–107)
CO2: 28 mmol/L (ref 20.0–31.0)
CREATININE: 1 mg/dL
EGFR CKD-EPI (2021) MALE: >90 mL/min/{1.73_m2} (ref >=60)
GLUCOSE RANDOM: 182 mg/dL — ABNORMAL HIGH (ref 70–179)
POTASSIUM: 4.7 mmol/L (ref 3.4–4.8)
PROTEIN TOTAL: 6.4 g/dL (ref 5.7–8.2)
SODIUM: 140 mmol/L (ref 135–145)

## 2022-04-01 LAB — CEA: CARCINOEMBRYONIC ANTIGEN: 1.7 ng/mL (ref 0.0–5.0)

## 2022-04-01 MED ADMIN — gadobenate dimeglumine (MULTIHANCE) 529 mg/mL (0.1mmol/0.2mL) solution 9 mL: 9 mL | INTRAVENOUS | @ 14:00:00 | Stop: 2022-04-01

## 2022-04-01 MED ADMIN — heparin, porcine (PF) 100 unit/mL injection 500 Units: 500 [IU] | INTRAVENOUS | @ 19:00:00 | Stop: 2022-04-02

## 2022-04-01 MED ADMIN — heparin, porcine (PF) 100 unit/mL injection 500 Units: 500 [IU] | INTRAVENOUS | @ 15:00:00 | Stop: 2022-04-02

## 2022-04-01 MED ADMIN — sodium chloride (NS) 0.9 % infusion: 100 mL/h | INTRAVENOUS | @ 18:00:00

## 2022-04-01 MED ADMIN — trastuzumab-dkst (OGIVRI) 630 mg in sodium chloride (NS) 0.9 % 250 mL IVPB: 6 mg/kg | INTRAVENOUS | @ 18:00:00 | Stop: 2022-04-01

## 2022-04-01 NOTE — Unmapped (Signed)
[facility] GI MEDICAL ONCOLOGY     PRIMARY CARE PROVIDER:  Pcp, None Per Patient  [address] [place] [address]    CONSULTING PROVIDERS  Dr. Rayetta Humphrey - Radiation Oncology  Dr. Rico Junker, [facility] Surgical Oncology   Dr. 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 FOLFOX x 8  05/2018 chemoradiotherapy to primary.   -Clinical complete response in liver and rectum, managed with Watch and Wait approach  03/2019 recurrent disease in rectum   05/2019 proctectomy with coloanal anastomosis, liver wedge resection (seg 3): liver with fibrosis, rectum invasive mod to poorly differentiated adenocarcinoma, 3 cm; into muscularis propria, no LVI, tumor at distal margin in area of tearing superceded by additional specimen, 0/16 nodes, two with prominent treatment effect. YpT2N0.  - complicated by abdominal abscess with MRSA  08/02/19 closure of diverting ileostomy  02/2020 2 sites of recurrence disease in liver adjacent to GB fossa  04/14/20 seg 4b wedge resection: two foci of adenocarcinoma c/w colorectal primary. Margin +; node negative   01/2021 SBRT 5000cGy in 25fx to met near GB fossa  06/2021 -09/2021 pelvic recurrence, FOLFOX x 6  11/05/2021 tucatinib-trastuzumab start [baseline echo nl]  - c1 LFTs 4x ULN. Improved quickly, resumed at tucatinib 250 bid    MOLECULAR PROFILE   TEMPUS from 06/2021 rectal mass biopsy:  HER2 amplified  TP53 and APC mutation  RAS/RAF WT  TMB 6.3, MSS    Treatment plan:  tucatinib + trastuzumab. Consider metastectomy q6-67m pending response    ___________________________________________________________________    ASSESSMENT  1. 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 Type I DM- controlled on insulin pump, no complications from DM  3. Elevated transaminases, stable today, CTM  4. ECOG 0    RECOMMENDATIONS  1. Tucatinib 250 mg BID + trastuzumab   2. RTC in 3 weeks for treatment, 6 weeks for labs, visit and treatment    DISCUSSION  We reviewed scans which appear to show stable to slight improved disease.  Final radiology read is pending.  He is tolerating therapy well so we will continued current regimen.  We discussed a potential metastasectomy but he feels that as long as this current treatment is working he would like to avoid surgery, which he was told would involve an ostomy.  We will repeat scans in 12 weeks.      Medical decision making based high complexity of cancer and treatment risk of complications and monitoring and management of toxicity of treatment.     This patient was seen and discussed with Dr. Forbes Cellar.    Esperanza Richters, MD  Hematology / Oncology Fellow  ______________________________________________________________    Interval History  Mr. Winterton returns today for ongoing management of his rectal cancer with liver mets.    - continues to tolerate treatment well  - bowel function is stable, he takes imodium BID, has BMs anywhere from daily to once every 3 days  - no nausea  - no sites of pain including right abdominal wall  - no weight gain, edema, dyspnea, chest pain that would suggest HF  - appetite is good    MEDICAL HISTORY  1. Type I DM- diagnosed at age 27, on insulin     SOCIAL HISTORY  Prior Denies smoking and drugs. Drinks  occasional alcohol. 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 He lives with his wife and two daughters age 73 and 57.     PHYSICAL EXAM  Vitals:    04/01/22 1013   BP: 112/65   Pulse: 60   Resp: 18   Temp: 36.6 ??C (97.8 ??F)   SpO2: 99%     GENERAL: well developed, well nourished, in no distress  PSYCH: full and appropriate range of affect with good insight and judgement  HEENT: NCAT, pupils equal, sclerae anicteric  SKIN: No rashes  ABD: no palpable mass over abdominal wall including at prior ostomy site    OBJECTIVE DATA  Reviewed in Epic   LFTs mildly elevated but improved compared to 3 weeks ago    RADIOLOGY RESULTS   MRI this am (Scans personally reviewed by me and my interpretation as follows)   - presacral and abdominal wall sites of disease appear stable

## 2022-04-01 NOTE — Unmapped (Signed)
Summary from today:  - We will continue your current treatment    For health related questions   For appointments & questions Monday through Friday 8 AM-- 5 PM   please call [phone] or Toll free [phone].    For urgent issues on Nights, Weekends and Holidays  Call [phone] and ask for the oncologist on call. This on-call doctor is able to help with symptoms  or other urgent issues. Please wait until regular office hours to call for prescription refills and questions about appointments.    [place] MyChart is a great way to get in touch with [place]! We check this often throughout the day.   You can expect a response within 24 hours of sending a message. Please do not use MyChart   for urgent questions about symptoms you are having!     Your GI Medical OncologyTreatment Team    Doctor: [place]. Forbes Cellar, MD and Esperanza Richters, MD  Nurse Practitioner: Eula Fried, ANP  Nurse Navigator: Ted Mcalpine, RN  Clinical Pharmacist: Konrad Penta, PharmD  Scheduling Administrator: Ms. Lollie Sails

## 2022-04-01 NOTE — Unmapped (Addendum)
Clinical Support on 04/01/2022   Component Date Value Ref Range Status    Sodium 04/01/2022 140  135 - 145 mmol/L Final    Potassium 04/01/2022 4.7  3.4 - 4.8 mmol/L Final    Chloride 04/01/2022 108 (H)  98 - 107 mmol/L Final    CO2 04/01/2022 28.0  20.0 - 31.0 mmol/L Final    Anion Gap 04/01/2022 4 (L)  5 - 14 mmol/L Final    BUN 04/01/2022 17  9 - 23 mg/dL Final    Creatinine 16/11/9602 1.00  0.73 - 1.18 mg/dL Final    BUN/Creatinine Ratio 04/01/2022 17   Final    eGFR CKD-EPI (2021) Male 04/01/2022 >90  >=60 mL/min/1.37m2 Final    eGFR calculated with CKD-EPI 2021 equation in accordance with [employer] and [employer] of Nephrology Task Force recommendations.    Glucose 04/01/2022 182 (H)  70 - 179 mg/dL Final    Calcium 54/10/8117 8.4 (L)  8.7 - 10.4 mg/dL Final    Albumin 14/78/2956 3.7  3.4 - 5.0 g/dL Final    Total Protein 04/01/2022 6.4  5.7 - 8.2 g/dL Final    Total Bilirubin 04/01/2022 0.9  0.3 - 1.2 mg/dL Final    AST 21/30/8657 48 (H)  <=34 U/L Final    ALT 04/01/2022 85 (H)  10 - 49 U/L Final    Alkaline Phosphatase 04/01/2022 134 (H)  46 - 116 U/L Final    CEA 04/01/2022 1.7  0.0 - 5.0 ng/mL Final    WBC 04/01/2022 4.8  3.6 - 11.2 10*9/L Final    RBC 04/01/2022 4.75  4.26 - 5.60 10*12/L Final    HGB 04/01/2022 14.3  12.9 - 16.5 g/dL Final    HCT 84/69/6295 42.5  39.0 - 48.0 % Final    MCV 04/01/2022 89.5  77.6 - 95.7 fL Final    MCH 04/01/2022 30.2  25.9 - 32.4 pg Final    MCHC 04/01/2022 33.7  32.0 - 36.0 g/dL Final    RDW 28/41/3244 13.3  12.2 - 15.2 % Final    MPV 04/01/2022 9.7  6.8 - 10.7 fL Final    Platelet 04/01/2022 150  150 - 450 10*9/L Final    nRBC 04/01/2022 0  <=4 /100 WBCs Final    Neutrophils % 04/01/2022 63.4  % Final    Lymphocytes % 04/01/2022 26.4  % Final    Monocytes % 04/01/2022 7.4  % Final    Eosinophils % 04/01/2022 2.2  % Final    Basophils % 04/01/2022 0.6  % Final    Absolute Neutrophils 04/01/2022 3.0  1.8 - 7.8 10*9/L Final    Absolute Lymphocytes 04/01/2022 1.3  1.1 - 3.6 10*9/L Final    Absolute Monocytes 04/01/2022 0.4  0.3 - 0.8 10*9/L Final    Absolute Eosinophils 04/01/2022 0.1  0.0 - 0.5 10*9/L Final    Absolute Basophils 04/01/2022 0.0  0.0 - 0.1 10*9/L Final          RED ZONE Means: RED ZONE: Take action now!     You need to be seen right away  Symptoms are at a severe level of discomfort    Call 911 or go to your nearest  Hospital for help     - Bleeding that will not stop    - Hard to breathe    - New seizure - Chest pain  - Fall or passing out  -Thoughts of hurting    yourself or others  Call 911 if you are going into the RED ZONE                  YELLOW ZONE Means:     Please call with any new or worsening symptom(s), even if not on this list.  Call [phone]  After hours, weekends, and holidays - you will reach a long recording with specific instructions, If not in an emergency such as above, please listen closely all the way to the end and choose the option that relates to your need.   You can be seen by a provider the same day through our Same Day Acute Care for Patients with Cancer program.      YELLOW ZONE: Take action today     Symptoms are new or worsening  You are not within your goal range for:    - Pain    - Shortness of breath    - Bleeding (nose, urine, stool, wound)    - Feeling sick to your stomach and throwing up    - Mouth sores/pain in your mouth or throat    - Hard stool or very loose stools (increase in       ostomy output)    - No urine for 12 hours    - Feeding tube or other catheter/tube issue    - Redness or pain at previous IV or port/catheter site    - Depressed or anxiety   - Swelling (leg, arm, abdomen,     face, neck)  - Skin rash or skin changes  - Wound issues (redness, drainage,    re-opened)  - Confusion  - Vision changes  - Fever >100.4 F or chills  - Worsening cough with mucus that is    green, yellow, or bloody  - Pain or burning when going to the    bathroom  - Home Infusion Pump Issue- call [phone]         Call your healthcare provider if you are going into the YELLOW ZONE     GREEN ZONE Means:  Your symptoms are under controls  Continue to take your medicine as ordered  Keep all visits to the provider GREEN ZONE: You are in control  No increase or worsening symptoms  Able to take your medicine  Able to drink and eat    - DO NOT use MyChart messages to report red or yellow symptoms. Allow up to 3    business days for a reply.  -MyChart is for non-urgent medication refills, scheduling requests, or other general questions.         [id number] Rev. 08/07/2021  Approved by Oncology Patient Education Committee

## 2022-04-05 DIAGNOSIS — C20 Malignant neoplasm of rectum: Principal | ICD-10-CM

## 2022-04-07 MED FILL — TUKYSA 150 MG TABLET: ORAL | 30 days supply | Qty: 60 | Fill #3

## 2022-04-07 MED FILL — TUKYSA 50 MG TABLET: ORAL | 30 days supply | Qty: 120 | Fill #4

## 2022-04-19 NOTE — Unmapped (Signed)
Patient arrived to infusion chair ambulatory following his scheduled nurse visit/lab and provider visit for for his scheduled infusion. Weight and VS performed at earlier visit and recorded. VS repeated on arrival to infusion. Port was accessed and labs drawn at earlier appointment. Labs reviewed and within treatment parameters. Port with blood return noted and flushes well. Doses calculated and verified. Medication released for pharmacy preparation. Port with blood return noted and flushes well. Infusion administered per treatment plan and tolerated without complication. IV tubing flushed well following infusion to complete medication.  Port flushed, heparin instilled and needle removed prior to discharge. Site dressed with 2x2 gauze and band aid. Patient instructed to check out with front desk to verify appointments and printed copy of AVS will be given to patient if desired.

## 2022-04-22 ENCOUNTER — Institutional Professional Consult (permissible substitution): Admit: 2022-04-22 | Discharge: 2022-04-23 | Payer: PRIVATE HEALTH INSURANCE

## 2022-04-22 ENCOUNTER — Ambulatory Visit: Admit: 2022-04-22 | Discharge: 2022-04-23 | Payer: PRIVATE HEALTH INSURANCE

## 2022-04-22 DIAGNOSIS — C20 Malignant neoplasm of rectum: Principal | ICD-10-CM

## 2022-04-22 LAB — COMPREHENSIVE METABOLIC PANEL
ALBUMIN: 3.6 g/dL (ref 3.4–5.0)
ALKALINE PHOSPHATASE: 245 U/L — ABNORMAL HIGH (ref 46–116)
ALT (SGPT): 212 U/L — ABNORMAL HIGH (ref 10–49)
ANION GAP: 7 mmol/L (ref 5–14)
AST (SGOT): 90 U/L — ABNORMAL HIGH (ref <=34)
BILIRUBIN TOTAL: 0.9 mg/dL (ref 0.3–1.2)
BLOOD UREA NITROGEN: 21 mg/dL (ref 9–23)
BUN / CREAT RATIO: 19
CALCIUM: 9 mg/dL (ref 8.7–10.4)
CHLORIDE: 108 mmol/L — ABNORMAL HIGH (ref 98–107)
CO2: 26.5 mmol/L (ref 20.0–31.0)
CREATININE: 1.08 mg/dL
EGFR CKD-EPI (2021) MALE: 83 mL/min/{1.73_m2} (ref >=60)
GLUCOSE RANDOM: 216 mg/dL — ABNORMAL HIGH (ref 70–179)
POTASSIUM: 4 mmol/L (ref 3.4–4.8)
PROTEIN TOTAL: 6.6 g/dL (ref 5.7–8.2)
SODIUM: 141 mmol/L (ref 135–145)

## 2022-04-22 LAB — CBC W/ AUTO DIFF
BASOPHILS ABSOLUTE COUNT: 0 10*9/L (ref 0.0–0.1)
BASOPHILS RELATIVE PERCENT: 0.6 %
EOSINOPHILS ABSOLUTE COUNT: 0.1 10*9/L (ref 0.0–0.5)
EOSINOPHILS RELATIVE PERCENT: 2.3 %
HEMATOCRIT: 42.2 % (ref 39.0–48.0)
HEMOGLOBIN: 14.3 g/dL (ref 12.9–16.5)
LYMPHOCYTES ABSOLUTE COUNT: 1.2 10*9/L (ref 1.1–3.6)
LYMPHOCYTES RELATIVE PERCENT: 28.8 %
MEAN CORPUSCULAR HEMOGLOBIN CONC: 33.9 g/dL (ref 32.0–36.0)
MEAN CORPUSCULAR HEMOGLOBIN: 30.5 pg (ref 25.9–32.4)
MEAN CORPUSCULAR VOLUME: 89.9 fL (ref 77.6–95.7)
MEAN PLATELET VOLUME: 9.8 fL (ref 6.8–10.7)
MONOCYTES ABSOLUTE COUNT: 0.3 10*9/L (ref 0.3–0.8)
MONOCYTES RELATIVE PERCENT: 7.4 %
NEUTROPHILS ABSOLUTE COUNT: 2.5 10*9/L (ref 1.8–7.8)
NEUTROPHILS RELATIVE PERCENT: 60.9 %
NUCLEATED RED BLOOD CELLS: 0 /100{WBCs} (ref <=4)
PLATELET COUNT: 150 10*9/L (ref 150–450)
RED BLOOD CELL COUNT: 4.69 10*12/L (ref 4.26–5.60)
RED CELL DISTRIBUTION WIDTH: 13.5 % (ref 12.2–15.2)
WBC ADJUSTED: 4.1 10*9/L (ref 3.6–11.2)

## 2022-04-22 LAB — CEA: CARCINOEMBRYONIC ANTIGEN: 1.8 ng/mL (ref 0.0–5.0)

## 2022-04-22 MED ADMIN — heparin, porcine (PF) 100 unit/mL injection 500 Units: 500 [IU] | INTRAVENOUS | @ 17:00:00 | Stop: 2022-04-22

## 2022-04-22 MED ADMIN — trastuzumab-dkst (OGIVRI) 630 mg in sodium chloride (NS) 0.9 % 250 mL IVPB: 6 mg/kg | INTRAVENOUS | @ 19:00:00 | Stop: 2022-04-22

## 2022-04-22 MED ADMIN — heparin, porcine (PF) 100 unit/mL injection 500 Units: 500 [IU] | INTRAVENOUS | @ 20:00:00 | Stop: 2022-04-22

## 2022-04-22 NOTE — Unmapped (Signed)
Went to see Max Stewart in infusion - LFTs elevated, bili normal.  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 and did drink over the weekend, currently not drinking.  He states it was from drinking and doesn't want to hold drug, he doesn't drink currently, only on periodic occasions.  Will recheck next week to ensure down trend.  He is in agreement with this plan.

## 2022-04-22 NOTE — Unmapped (Signed)
Infusion on 04/22/2022   Component Date Value Ref Range Status    Sodium 04/22/2022 141  135 - 145 mmol/L Final    Potassium 04/22/2022 4.0  3.4 - 4.8 mmol/L Final    Chloride 04/22/2022 108 (H)  98 - 107 mmol/L Final    CO2 04/22/2022 26.5  20.0 - 31.0 mmol/L Final    Anion Gap 04/22/2022 7  5 - 14 mmol/L Final    BUN 04/22/2022 21  9 - 23 mg/dL Final    Creatinine 16/11/9602 1.08  0.73 - 1.18 mg/dL Final    BUN/Creatinine Ratio 04/22/2022 19   Final    eGFR CKD-EPI (2021) Male 04/22/2022 83  >=60 mL/min/1.25m2 Final    eGFR calculated with CKD-EPI 2021 equation in accordance with [employer] and [employer] of Nephrology Task Force recommendations.    Glucose 04/22/2022 216 (H)  70 - 179 mg/dL Final    Calcium 54/10/8117 9.0  8.7 - 10.4 mg/dL Final    Albumin 14/78/2956 3.6  3.4 - 5.0 g/dL Final    Total Protein 04/22/2022 6.6  5.7 - 8.2 g/dL Final    Total Bilirubin 04/22/2022 0.9  0.3 - 1.2 mg/dL Final    AST 21/30/8657 90 (H)  <=34 U/L Final    ALT 04/22/2022 212 (H)  10 - 49 U/L Final    Alkaline Phosphatase 04/22/2022 245 (H)  46 - 116 U/L Final    WBC 04/22/2022 4.1  3.6 - 11.2 10*9/L Final    RBC 04/22/2022 4.69  4.26 - 5.60 10*12/L Final    HGB 04/22/2022 14.3  12.9 - 16.5 g/dL Final    HCT 84/69/6295 42.2  39.0 - 48.0 % Final    MCV 04/22/2022 89.9  77.6 - 95.7 fL Final    MCH 04/22/2022 30.5  25.9 - 32.4 pg Final    MCHC 04/22/2022 33.9  32.0 - 36.0 g/dL Final    RDW 28/41/3244 13.5  12.2 - 15.2 % Final    MPV 04/22/2022 9.8  6.8 - 10.7 fL Final    Platelet 04/22/2022 150  150 - 450 10*9/L Final    nRBC 04/22/2022 0  <=4 /100 WBCs Final    Neutrophils % 04/22/2022 60.9  % Final    Lymphocytes % 04/22/2022 28.8  % Final    Monocytes % 04/22/2022 7.4  % Final    Eosinophils % 04/22/2022 2.3  % Final    Basophils % 04/22/2022 0.6  % Final    Absolute Neutrophils 04/22/2022 2.5  1.8 - 7.8 10*9/L Final    Absolute Lymphocytes 04/22/2022 1.2  1.1 - 3.6 10*9/L Final    Absolute Monocytes 04/22/2022 0.3  0.3 - 0.8 10*9/L Final    Absolute Eosinophils 04/22/2022 0.1  0.0 - 0.5 10*9/L Final    Absolute Basophils 04/22/2022 0.0  0.0 - 0.1 10*9/L Final

## 2022-04-22 NOTE — Unmapped (Signed)
Patient was seen in nurse visit where port was accessed and labs were drawn. He presented to infusion and weight and vital signs were obtained. His port was flushed with brisk blood return noted. No premedications or lab parameters for today's treatment. His provider saw him chairside. Treatment was administered as ordered without difficulty while in clinic. His port was flushed, brisk blood return noted, then heparin-locked and de-accessed with band aid applied. He was discharged home to self-care.

## 2022-04-30 NOTE — Unmapped (Signed)
RN Navigator contacted pt to follow up on LFT's. Plan was for pt to get labs drawn at [facility] this week. Reached VM. Left message and encouraged pt to send Mychart to let team know when labs have been drawn.     Will follow up as needed.

## 2022-04-30 NOTE — Unmapped (Signed)
[facility] Clinical Assessment & Refill Coordination Note    Max Stewart, DOB: 03-23-1970  Phone: [phone] (home) [phone] (work)    All above HIPAA information was verified with patient.     Was a Nurse, learning disability used for this call? No    Specialty Medication(s):   Hematology/Oncology: Tukysa 150 and 50 mg     Current Outpatient Medications   Medication Sig Dispense Refill    acetaminophen (TYLENOL) 325 MG tablet Take 3 tablets (975 mg total) by mouth every six (6) hours.  0    aluminum-magnesium hydroxide-simethicone (MAALOX PLUS) 200-200-20 mg/5 mL Susp Mix equal parts diphenhydramine, lidocaine, and liquid antacid. Swish 5 to in mouth for 60 seconds, every 4 to 6 hours as needed, then swallow or spit mixture out (as directed by prescriber). Shake well before using. Refrigerate after mixing and discard after 14 days. 354 mL 0    citalopram (CELEXA) 40 MG tablet Take 1 tablet (40 mg total) by mouth daily before breakfast. 90 tablet 3    diphenhydrAMINE (BENADRYL) 12.5 mg/5 mL liquid Mix equal parts diphenhydramine, lidocaine, and liquid antacid. Swish 5 to in mouth for 60 seconds, every 4 to 6 hours as needed, then swallow or spit mixture out (as directed by prescriber). Shake well before using. Refrigerate after mixing and discard after 14 days. 118 mL 0    heparin, porcine, PF, 100 unit/mL Syrg Infuse 5 mL into a venous catheter every fourteen (14) days. For use while patient is on fluorouracil (5-FU) home infusion.   Flush IV catheter with heparin 5 mL as directed after final saline flush per SASH method and as needed for line maintenance. 4 each PRN    insulin ASPART (NOVOLOG) 100 unit/mL injection Inject 1.2-1.4 mL (120-140 Units total) under the skin daily before breakfast.      lidocaine 2% viscous (XYLOCAINE) 2 % Soln Mix equal parts diphenhydramine, lidocaine, and liquid antacid. Swish 5 to in mouth for 60 seconds, every 4 to 6 hours as needed, then swallow or spit mixture out (as directed by prescriber). Shake well before using. Refrigerate after mixing and discard after 14 days. 100 mL 0    loperamide (IMODIUM) 2 mg capsule Take 2 capsules (4 mg) by mouth for first loose stool followed by 1 capsule (2 mg) after each loose stool thereafter (maximum of 8 tablets per day). 360 capsule 3    loratadine (CLARITIN) 10 mg tablet Take 1 tablet (10 mg total) by mouth daily as needed for allergies.      losartan (COZAAR) 50 MG tablet Take 1 tablet (50 mg total) by mouth daily before breakfast.      ondansetron (ZOFRAN) 8 MG tablet Take 1 tablet (8 mg total) by mouth every eight (8) hours as needed for nausea. 60 tablet 3    prochlorperazine (COMPAZINE) 10 MG tablet Take 1 tablet (10 mg total) by mouth every six (6) hours as needed (nausea). 30 tablet 2    rosuvastatin (CRESTOR) 20 MG tablet Take 1 tablet (20 mg total) by mouth daily.      sildenafiL (VIAGRA) 25 MG tablet Take 1 tablet (25 mg total) by mouth daily as needed for erectile dysfunction. If 1 tablet (25 mg) is ineffective, may take up to maximum of 2 tablets (50 mg) by mouth daily as needed. 60 tablet 5    sodium chloride (NS) 0.9 % injection Infuse 10 mL into a venous catheter every fourteen (14) days. For  use while patient is on fluorouracil (5-FU) home infusion.   Flush IV catheter with normal saline 10 mL prior to and after infusion followed by heparin flush per SASH method and as needed for line maintenance. 6 each PRN    tucatinib (TUKYSA) 150 mg tablet Take 1 tablet (150 mg total) by mouth two (2) times a day with 1 other tucatinib prescription for 250 mg total. Take with or without a meal. 60 tablet 6    tucatinib (TUKYSA) 50 mg tablet Take 2 tablets (100 mg total) by mouth two (2) times a day with 1 other tucatinib prescription for 250 mg total. Take with or without a meal. 120 tablet 6     No current facility-administered medications for this visit.        Changes to medications: Max Stewart reports no changes at this time.    Allergies   Allergen Reactions    Ciprofloxacin      Liver Disorder       Shellfish Containing Products Hives       Changes to allergies: No    SPECIALTY MEDICATION ADHERENCE     Tukysa 150 mg: 15 days of medicine on hand   Tukysa 50 mg: 15 days of medicine on hand     Medication Adherence    Patient reported X missed doses in the last month: 0  Specialty Medication: Tukysa 150 mg + 50 mg tablets - total 250 mg twice daily  Patient is on additional specialty medications: No  Informant: patient  Confirmed plan for next specialty medication refill: delivery by pharmacy  Refills needed for supportive medications: not needed          Specialty medication(s) dose(s) confirmed: Regimen is correct and unchanged.     Are there any concerns with adherence? No    Adherence counseling provided? Not needed    CLINICAL MANAGEMENT AND INTERVENTION      Clinical Benefit Assessment:    Do you feel the medicine is effective or helping your condition? Yes    Clinical Benefit counseling provided? Not needed    Adverse Effects Assessment:    Are you experiencing any side effects? No    Are you experiencing difficulty administering your medicine? No    Quality of Life Assessment:    Quality of Life    Rheumatology  Oncology  1. What impact has your specialty medication had on the reduction of your daily pain or discomfort level?: Some  2. On a scale of 1-10, how would you rate your ability to manage side effects associated with your specialty medication? (1=no issues, 10 = unable to take medication due to side effects): 1  Dermatology  Cystic Fibrosis          How many days over the past month did your rectal cancer  keep you from your normal activities? For example, brushing your teeth or getting up in the morning. 0    Have you discussed this with your provider? Not needed    Acute Infection Status:    Acute infections noted within Epic:  No active infections  Patient reported infection: None    Therapy Appropriateness:    Is therapy appropriate and patient progressing towards therapeutic goals? Yes, therapy is appropriate and should be continued    DISEASE/MEDICATION-SPECIFIC INFORMATION      N/A    Oncology: Is the patient receiving adequate infection prevention treatment? Not applicable  Does the patient have adequate nutritional support? Not applicable    PATIENT  SPECIFIC NEEDS     Does the patient have any physical, cognitive, or cultural barriers? No    Is the patient high risk? No    Did the patient require a clinical intervention? No    Does the patient require physician intervention or other additional services (i.e., nutrition, smoking cessation, social work)? No    SOCIAL DETERMINANTS OF HEALTH     At the [facility], we have learned that life circumstances - like trouble affording food, housing, utilities, or transportation can affect the health of many of our patients.   That is why we wanted to ask: are you currently experiencing any life circumstances that are negatively impacting your health and/or quality of life? Patient declined to answer    Social Determinants of Health     Financial Resource Strain: Low Risk  (06/30/2021)    Overall Financial Resource Strain (CARDIA)     Difficulty of Paying Living Expenses: Not very hard   Internet Connectivity: No Internet connectivity concern identified (06/30/2021)    Internet Connectivity     Do you have access to internet services: Yes     How do you connect to the internet: Personal Device at home     Is your internet connection strong enough for you to watch video on your device without major problems?: Yes     Do you have enough data to get through the month?: Yes     Does at least one of the devices have a camera that you can use for video chat?: Yes   Food Insecurity: No Food Insecurity (06/30/2021)    Hunger Vital Sign     Worried About Running Out of Food in the Last Year: Never true     Ran Out of Food in the Last Year: Never true   Tobacco Use: Low Risk  (04/01/2022)    Patient History     Smoking Tobacco Use: Never     Smokeless Tobacco Use: Never     Passive Exposure: Not on file   Housing/Utilities: Low Risk  (06/30/2021)    Housing/Utilities     Within the past 12 months, have you ever stayed: outside, in a car, in a tent, in an overnight shelter, or temporarily in someone else's home (i.e. couch-surfing)?: No     Are you worried about losing your housing?: No     Within the past 12 months, have you been unable to get utilities (heat, electricity) when it was really needed?: No   Alcohol Use: Not on file   Transportation Needs: No Transportation Needs (06/30/2021)    PRAPARE - Transportation     Lack of Transportation (Medical): No     Lack of Transportation (Non-Medical): No   Substance Use: Not on file   Health Literacy: Low Risk  (06/30/2021)    Health Literacy     : Never   Physical Activity: Not on file   Interpersonal Safety: Not on file   Stress: Not on file   Intimate Partner Violence: Not on file   Depression: Not at risk (10/02/2018)    Received from [facility]    PHQ-2     (OBSOLETE) Total Score =: 0   Social Connections: Not on file       Would you be willing to receive help with any of the needs that you have identified today? Not applicable       SHIPPING     Specialty Medication(s) to be Shipped:  Hematology/Oncology: Tukysa 150 and 50 mg    Other medication(s) to be shipped: No additional medications requested for fill at this time     Changes to insurance: No    Patient was informed of new phone menu: Yes    Delivery Scheduled: Yes, Expected medication delivery date: 05/11/22.     Medication will be delivered via Next Day Courier to the confirmed prescription address in [facility].    The patient will receive a drug information handout for each medication shipped and additional FDA Medication Guides as required.  Verified that patient has previously received a Conservation officer, historic buildings and a Surveyor, mining.    The patient or caregiver noted above participated in the development of this care plan and knows that they can request review of or adjustments to the care plan at any time.      All of the patient's questions and concerns have been addressed.    Kermit Balo, [facility]   [facility] Specialty Pharmacist

## 2022-05-07 NOTE — Unmapped (Signed)
RN Navigator contacted pt to follow up on LFT results. Pt said he has been busy with work and unable to go by [employer]. Plans to go today or first thing Monday morning.

## 2022-05-10 MED FILL — TUKYSA 150 MG TABLET: ORAL | 30 days supply | Qty: 60 | Fill #4

## 2022-05-10 MED FILL — TUKYSA 50 MG TABLET: ORAL | 30 days supply | Qty: 120 | Fill #5

## 2022-05-12 NOTE — Unmapped (Unsigned)
[facility] GI MEDICAL ONCOLOGY     PRIMARY CARE PROVIDER:  Pcp, None Per Patient  [address] [place] [address]    CONSULTING PROVIDERS  Dr. Rayetta Humphrey - Radiation Oncology  Dr. Rico Junker, [facility] Surgical Oncology   Dr. 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 FOLFOX x 8  05/2018 chemoradiotherapy to primary.   -Clinical complete response in liver and rectum, managed with Watch and Wait approach  03/2019 recurrent disease in rectum   05/2019 proctectomy with coloanal anastomosis, liver wedge resection (seg 3): liver with fibrosis, rectum invasive mod to poorly differentiated adenocarcinoma, 3 cm; into muscularis propria, no LVI, tumor at distal margin in area of tearing superceded by additional specimen, 0/16 nodes, two with prominent treatment effect. YpT2N0.  - complicated by abdominal abscess with MRSA  08/02/19 closure of diverting ileostomy  02/2020 2 sites of recurrence disease in liver adjacent to GB fossa  04/14/20 seg 4b wedge resection: two foci of adenocarcinoma c/w colorectal primary. Margin +; node negative   01/2021 SBRT 5000cGy in 53fx to met near GB fossa  06/2021 -09/2021 pelvic recurrence, FOLFOX x 6  11/05/2021 tucatinib-trastuzumab start [baseline echo nl]  - c1 LFTs 4x ULN. Improved quickly, resumed at tucatinib 250 bid    MOLECULAR PROFILE   TEMPUS from 06/2021 rectal mass biopsy:  HER2 amplified  TP53 and APC mutation  RAS/RAF WT  TMB 6.3, MSS    Treatment plan:  tucatinib + trastuzumab. Consider metastectomy q6-48m pending response    ___________________________________________________________________    ASSESSMENT  1. 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 Type I DM- controlled on insulin pump, no complications from DM  3. Elevated transaminases, stable today, CTM  4. ECOG 0    RECOMMENDATIONS  1. Tucatinib 250 mg BID + trastuzumab   2. RTC in 3 weeks for treatment, 6 weeks for labs, visit and treatment    DISCUSSION      Medical decision making based high complexity of cancer and treatment risk of complications and monitoring and management of toxicity of treatment.     ______________________________________________________________    Interval History  Max Stewart returns today for ongoing management of his rectal cancer with liver mets.          - continues to tolerate treatment well  - bowel function is stable, he takes imodium BID, has BMs anywhere from daily to once every 3 days  - no nausea  - no sites of pain including right abdominal wall  - no weight gain, edema, dyspnea, chest pain that would suggest HF  - appetite is good    MEDICAL HISTORY  1. Type I DM- diagnosed at age 52, on insulin     SOCIAL HISTORY  Prior Denies smoking and drugs. Drinks occasional alcohol. 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 He lives with his wife and two daughters age 21 and 18.     PHYSICAL EXAM  There were no vitals filed for this visit.    GENERAL: well developed, well nourished, in no distress  PSYCH: full and appropriate range of affect with good insight and judgement  HEENT: NCAT, pupils equal, sclerae anicteric  SKIN: No rashes  ABD: no palpable mass over abdominal wall including at  prior ostomy site    OBJECTIVE DATA  Reviewed in Epic   LFTs mildly elevated but improved compared to 3 weeks ago    RADIOLOGY RESULTS   None today

## 2022-05-13 ENCOUNTER — Institutional Professional Consult (permissible substitution): Admit: 2022-05-13 | Discharge: 2022-05-13 | Payer: PRIVATE HEALTH INSURANCE

## 2022-05-13 ENCOUNTER — Ambulatory Visit: Admit: 2022-05-13 | Discharge: 2022-05-13 | Payer: PRIVATE HEALTH INSURANCE

## 2022-05-13 DIAGNOSIS — C20 Malignant neoplasm of rectum: Principal | ICD-10-CM

## 2022-05-13 DIAGNOSIS — E109 Type 1 diabetes mellitus without complications: Principal | ICD-10-CM

## 2022-05-13 DIAGNOSIS — Z79899 Other long term (current) drug therapy: Principal | ICD-10-CM

## 2022-05-13 DIAGNOSIS — C7989 Secondary malignant neoplasm of other specified sites: Principal | ICD-10-CM

## 2022-05-13 DIAGNOSIS — C19 Malignant neoplasm of rectosigmoid junction: Principal | ICD-10-CM

## 2022-05-13 DIAGNOSIS — Z09 Encounter for follow-up examination after completed treatment for conditions other than malignant neoplasm: Principal | ICD-10-CM

## 2022-05-13 LAB — CBC W/ AUTO DIFF
BASOPHILS ABSOLUTE COUNT: 0 10*9/L (ref 0.0–0.1)
BASOPHILS RELATIVE PERCENT: 0.6 %
EOSINOPHILS ABSOLUTE COUNT: 0.1 10*9/L (ref 0.0–0.5)
EOSINOPHILS RELATIVE PERCENT: 4 %
HEMATOCRIT: 40 % (ref 39.0–48.0)
HEMOGLOBIN: 13.5 g/dL (ref 12.9–16.5)
LYMPHOCYTES ABSOLUTE COUNT: 1.2 10*9/L (ref 1.1–3.6)
LYMPHOCYTES RELATIVE PERCENT: 34.4 %
MEAN CORPUSCULAR HEMOGLOBIN CONC: 33.9 g/dL (ref 32.0–36.0)
MEAN CORPUSCULAR HEMOGLOBIN: 30.4 pg (ref 25.9–32.4)
MEAN CORPUSCULAR VOLUME: 89.7 fL (ref 77.6–95.7)
MEAN PLATELET VOLUME: 10.1 fL (ref 6.8–10.7)
MONOCYTES ABSOLUTE COUNT: 0.4 10*9/L (ref 0.3–0.8)
MONOCYTES RELATIVE PERCENT: 10.1 %
NEUTROPHILS ABSOLUTE COUNT: 1.8 10*9/L (ref 1.8–7.8)
NEUTROPHILS RELATIVE PERCENT: 50.9 %
NUCLEATED RED BLOOD CELLS: 0 /100{WBCs} (ref <=4)
PLATELET COUNT: 146 10*9/L — ABNORMAL LOW (ref 150–450)
RED BLOOD CELL COUNT: 4.46 10*12/L (ref 4.26–5.60)
RED CELL DISTRIBUTION WIDTH: 13.1 % (ref 12.2–15.2)
WBC ADJUSTED: 3.6 10*9/L (ref 3.6–11.2)

## 2022-05-13 LAB — COMPREHENSIVE METABOLIC PANEL
ALBUMIN: 3.6 g/dL (ref 3.4–5.0)
ALKALINE PHOSPHATASE: 159 U/L — ABNORMAL HIGH (ref 46–116)
ALT (SGPT): 144 U/L — ABNORMAL HIGH (ref 10–49)
ANION GAP: 4 mmol/L — ABNORMAL LOW (ref 5–14)
AST (SGOT): 74 U/L — ABNORMAL HIGH (ref <=34)
BILIRUBIN TOTAL: 0.6 mg/dL (ref 0.3–1.2)
BLOOD UREA NITROGEN: 22 mg/dL (ref 9–23)
BUN / CREAT RATIO: 21
CALCIUM: 8.8 mg/dL (ref 8.7–10.4)
CHLORIDE: 110 mmol/L — ABNORMAL HIGH (ref 98–107)
CO2: 26.7 mmol/L (ref 20.0–31.0)
CREATININE: 1.04 mg/dL
EGFR CKD-EPI (2021) MALE: 86 mL/min/{1.73_m2} (ref >=60)
GLUCOSE RANDOM: 168 mg/dL (ref 70–179)
POTASSIUM: 4.3 mmol/L (ref 3.4–4.8)
PROTEIN TOTAL: 7.3 g/dL (ref 5.7–8.2)
SODIUM: 141 mmol/L (ref 135–145)

## 2022-05-13 LAB — CEA: CARCINOEMBRYONIC ANTIGEN: 2 ng/mL (ref 0.0–5.0)

## 2022-05-13 MED ADMIN — trastuzumab-dkst (OGIVRI) 630 mg in sodium chloride (NS) 0.9 % 250 mL IVPB: 6 mg/kg | INTRAVENOUS | @ 16:00:00 | Stop: 2022-05-13

## 2022-05-13 MED ADMIN — heparin, porcine (PF) 100 unit/mL injection 500 Units: 500 [IU] | INTRAVENOUS | @ 16:00:00 | Stop: 2022-05-13

## 2022-05-13 MED ADMIN — heparin, porcine (PF) 100 unit/mL injection 500 Units: 500 [IU] | INTRAVENOUS | @ 14:00:00 | Stop: 2022-05-13

## 2022-05-13 MED ADMIN — sodium chloride (NS) 0.9 % infusion: 100 mL/h | INTRAVENOUS | @ 15:00:00 | Stop: 2022-05-13

## 2022-05-13 NOTE — Unmapped (Signed)
.      Patient arrived to infusion clinic at 1103 alert and oriented x 3. Ambulated with steady gait  Current weight obtained and vital signs measured.    Pt reports the following side effects after the last infusion - denies adverse events from last infusion  Port to right chest, accessed prior to arrival at infusion clinic.  Dressing clear and intact. Port flushed with 10 ml Normal saline using pulsatile method.  blood return verified.    Pt denies pain, swelling or irritation with flushing of Port.    Parameters for infusion in treatment plan reviewed and dose calculation using weight from today verified to be within 10% of current dose.  NO lab parameters for treatment today.   Infusion completed without report of complication or additional side effects.   Post infusion blood return verified,  port flushed with Normal saline and heparin per protocol.  Huber needle removed and site covered with bandaid.  Pt discharged.

## 2022-05-13 NOTE — Unmapped (Signed)
Clinical Support on 05/13/2022   Component Date Value Ref Range Status    Sodium 05/13/2022 141  135 - 145 mmol/L Final    Potassium 05/13/2022 4.3  3.4 - 4.8 mmol/L Final    Chloride 05/13/2022 110 (H)  98 - 107 mmol/L Final    CO2 05/13/2022 26.7  20.0 - 31.0 mmol/L Final    Anion Gap 05/13/2022 4 (L)  5 - 14 mmol/L Final    BUN 05/13/2022 22  9 - 23 mg/dL Final    Creatinine 86/57/8469 1.04  0.73 - 1.18 mg/dL Final    BUN/Creatinine Ratio 05/13/2022 21   Final    eGFR CKD-EPI (2021) Male 05/13/2022 86  >=60 mL/min/1.59m2 Final    eGFR calculated with CKD-EPI 2021 equation in accordance with [employer] and [employer] of Nephrology Task Force recommendations.    Glucose 05/13/2022 168  70 - 179 mg/dL Final    Calcium 62/95/2841 8.8  8.7 - 10.4 mg/dL Final    Albumin 32/44/0102 3.6  3.4 - 5.0 g/dL Final    Total Protein 05/13/2022 7.3  5.7 - 8.2 g/dL Final    Total Bilirubin 05/13/2022 0.6  0.3 - 1.2 mg/dL Final    AST 72/53/6644 74 (H)  <=34 U/L Final    ALT 05/13/2022 144 (H)  10 - 49 U/L Final    Alkaline Phosphatase 05/13/2022 159 (H)  46 - 116 U/L Final    CEA 05/13/2022 2.0  0.0 - 5.0 ng/mL Final    WBC 05/13/2022 3.6  3.6 - 11.2 10*9/L Final    RBC 05/13/2022 4.46  4.26 - 5.60 10*12/L Final    HGB 05/13/2022 13.5  12.9 - 16.5 g/dL Final    HCT 03/47/4259 40.0  39.0 - 48.0 % Final    MCV 05/13/2022 89.7  77.6 - 95.7 fL Final    MCH 05/13/2022 30.4  25.9 - 32.4 pg Final    MCHC 05/13/2022 33.9  32.0 - 36.0 g/dL Final    RDW 56/38/7564 13.1  12.2 - 15.2 % Final    MPV 05/13/2022 10.1  6.8 - 10.7 fL Final    Platelet 05/13/2022 146 (L)  150 - 450 10*9/L Final    nRBC 05/13/2022 0  <=4 /100 WBCs Final    Neutrophils % 05/13/2022 50.9  % Final    Lymphocytes % 05/13/2022 34.4  % Final    Monocytes % 05/13/2022 10.1  % Final    Eosinophils % 05/13/2022 4.0  % Final    Basophils % 05/13/2022 0.6  % Final    Absolute Neutrophils 05/13/2022 1.8  1.8 - 7.8 10*9/L Final    Absolute Lymphocytes 05/13/2022 1.2  1.1 - 3.6 10*9/L Final    Absolute Monocytes 05/13/2022 0.4  0.3 - 0.8 10*9/L Final    Absolute Eosinophils 05/13/2022 0.1  0.0 - 0.5 10*9/L Final    Absolute Basophils 05/13/2022 0.0  0.0 - 0.1 10*9/L Final

## 2022-05-27 NOTE — Unmapped (Signed)
Max Stewart has been contacted in regards to their refill of Tukysa. At this time, they have declined refill due to patient having 28 doses remaining. Refill assessment call date has been updated per the patient's request.

## 2022-06-03 ENCOUNTER — Ambulatory Visit: Admit: 2022-06-03 | Discharge: 2022-06-04 | Payer: PRIVATE HEALTH INSURANCE

## 2022-06-03 DIAGNOSIS — C20 Malignant neoplasm of rectum: Principal | ICD-10-CM

## 2022-06-03 LAB — CBC W/ AUTO DIFF
BASOPHILS ABSOLUTE COUNT: 0 10*9/L (ref 0.0–0.1)
BASOPHILS RELATIVE PERCENT: 0.5 %
EOSINOPHILS ABSOLUTE COUNT: 0.1 10*9/L (ref 0.0–0.5)
EOSINOPHILS RELATIVE PERCENT: 3.2 %
HEMATOCRIT: 39.8 % (ref 39.0–48.0)
HEMOGLOBIN: 13.5 g/dL (ref 12.9–16.5)
LYMPHOCYTES ABSOLUTE COUNT: 1.4 10*9/L (ref 1.1–3.6)
LYMPHOCYTES RELATIVE PERCENT: 34.4 %
MEAN CORPUSCULAR HEMOGLOBIN CONC: 34 g/dL (ref 32.0–36.0)
MEAN CORPUSCULAR HEMOGLOBIN: 30.4 pg (ref 25.9–32.4)
MEAN CORPUSCULAR VOLUME: 89.6 fL (ref 77.6–95.7)
MEAN PLATELET VOLUME: 9.6 fL (ref 6.8–10.7)
MONOCYTES ABSOLUTE COUNT: 0.4 10*9/L (ref 0.3–0.8)
MONOCYTES RELATIVE PERCENT: 9.9 %
NEUTROPHILS ABSOLUTE COUNT: 2 10*9/L (ref 1.8–7.8)
NEUTROPHILS RELATIVE PERCENT: 52 %
NUCLEATED RED BLOOD CELLS: 0 /100{WBCs} (ref <=4)
PLATELET COUNT: 148 10*9/L — ABNORMAL LOW (ref 150–450)
RED BLOOD CELL COUNT: 4.44 10*12/L (ref 4.26–5.60)
RED CELL DISTRIBUTION WIDTH: 12.9 % (ref 12.2–15.2)
WBC ADJUSTED: 3.9 10*9/L (ref 3.6–11.2)

## 2022-06-03 LAB — COMPREHENSIVE METABOLIC PANEL
ALBUMIN: 3.3 g/dL — ABNORMAL LOW (ref 3.4–5.0)
ALKALINE PHOSPHATASE: 145 U/L — ABNORMAL HIGH (ref 46–116)
ALT (SGPT): 97 U/L — ABNORMAL HIGH (ref 10–49)
ANION GAP: 6 mmol/L (ref 5–14)
AST (SGOT): 39 U/L — ABNORMAL HIGH (ref <=34)
BILIRUBIN TOTAL: 0.5 mg/dL (ref 0.3–1.2)
BLOOD UREA NITROGEN: 22 mg/dL (ref 9–23)
BUN / CREAT RATIO: 22
CALCIUM: 8.3 mg/dL — ABNORMAL LOW (ref 8.7–10.4)
CHLORIDE: 108 mmol/L — ABNORMAL HIGH (ref 98–107)
CO2: 27.6 mmol/L (ref 20.0–31.0)
CREATININE: 0.98 mg/dL
EGFR CKD-EPI (2021) MALE: >90 mL/min/{1.73_m2} (ref >=60)
GLUCOSE RANDOM: 148 mg/dL (ref 70–179)
POTASSIUM: 4 mmol/L (ref 3.4–4.8)
PROTEIN TOTAL: 6 g/dL (ref 5.7–8.2)
SODIUM: 142 mmol/L (ref 135–145)

## 2022-06-03 LAB — CEA: CARCINOEMBRYONIC ANTIGEN: 1.7 ng/mL (ref 0.0–5.0)

## 2022-06-03 MED ADMIN — sodium chloride (NS) 0.9 % infusion: 100 mL/h | INTRAVENOUS | @ 13:00:00 | Stop: 2022-06-03

## 2022-06-03 MED ADMIN — trastuzumab-dkst (OGIVRI) 630 mg in sodium chloride (NS) 0.9 % 250 mL IVPB: 6 mg/kg | INTRAVENOUS | @ 13:00:00 | Stop: 2022-06-03

## 2022-06-03 MED ADMIN — heparin, porcine (PF) 100 unit/mL injection 500 Units: 500 [IU] | INTRAVENOUS | @ 13:00:00 | Stop: 2022-06-03

## 2022-06-03 NOTE — Unmapped (Signed)
Patient arrived to clinic at 730.  Patient had port accessed with brisk blood return and labs sent.  Patients weight and vitals taken upon arrival.  Patient had no labs parameters or pre meds to give per treatment plan.  Patients port flushed pre and post chemo with brisk blood return.  Patient started on IV chemo without complications.  Patients port flushed with heparin post chemo and bandage placed.  Patient offered printed copy of AVS and discharged home without concerns.

## 2022-06-03 NOTE — Unmapped (Signed)
Lab Results   Component Value Date    WBC 3.9 06/03/2022    HGB 13.5 06/03/2022    HCT 39.8 06/03/2022    PLT 148 (L) 06/03/2022       Lab Results   Component Value Date    NA 142 06/03/2022    K 4.0 06/03/2022    CL 108 (H) 06/03/2022    CO2 27.6 06/03/2022    BUN 22 06/03/2022    CREATININE 0.98 06/03/2022    GLU 148 06/03/2022    CALCIUM 8.3 (L) 06/03/2022    MG 1.9 09/21/2021    PHOS 3.3 04/15/2020       Lab Results   Component Value Date    BILITOT 0.5 06/03/2022    BILIDIR 0.30 12/03/2021    PROT 6.0 06/03/2022    ALBUMIN 3.3 (L) 06/03/2022    ALT 97 (H) 06/03/2022    AST 39 (H) 06/03/2022    ALKPHOS 145 (H) 06/03/2022       Lab Results   Component Value Date    PT 12.0 02/29/2020    INR 1.03 02/29/2020    APTT 28.3 06/04/2019

## 2022-06-16 NOTE — Unmapped (Signed)
[facility] Refill Coordination Note    Specialty Medication(s) to be Shipped:   Hematology/Oncology: [name] 150mg  and Tukysa 50 mg      Other medication(s) to be shipped: No additional medications requested for fill at this time     MALCUM LAHUE III, DOB: September 24, 1970  Phone: [phone] (home) [phone] (work)      All above HIPAA information was verified with patient.     Was a Nurse, learning disability used for this call? No    Completed refill call assessment today to schedule patient's medication shipment from the [facility] [phone]).  All relevant notes have been reviewed.     Specialty medication(s) and dose(s) confirmed: Regimen is correct and unchanged.   Changes to medications: Majed reports no changes at this time.  Changes to insurance: No  New side effects reported not previously addressed with a pharmacist or physician: None reported  Questions for the pharmacist: No    Confirmed patient received a Conservation officer, historic buildings and a Surveyor, mining with first shipment. The patient will receive a drug information handout for each medication shipped and additional FDA Medication Guides as required.       DISEASE/MEDICATION-SPECIFIC INFORMATION        N/A    SPECIALTY MEDICATION ADHERENCE     Medication Adherence    Patient reported X missed doses in the last month: 0  Specialty Medication: Tukysa 50 mg  Patient is on additional specialty medications: Yes  Additional Specialty Medications: Tukysa 150 mg  Patient Reported Additional Medication X Missed Doses in the Last Month: 0  Patient is on more than two specialty medications: No  Informant: patient     Were doses missed due to medication being on hold? No    Tukysa 150 mg: 4 days of medicine on hand   Tukysa 50 mg: 4 days of medicine on hand       REFERRAL TO PHARMACIST     Referral to the pharmacist: Not needed      [facility]     Shipping address confirmed in Epic.     Delivery Scheduled: Yes, Expected medication delivery date: 06/18/22.     Medication will be delivered via Same Day Courier to the prescription address in Epic [place].    [name]   [facility] Specialty Technician

## 2022-06-18 MED FILL — TUKYSA 50 MG TABLET: ORAL | 30 days supply | Qty: 120 | Fill #6

## 2022-06-18 MED FILL — TUKYSA 150 MG TABLET: ORAL | 30 days supply | Qty: 60 | Fill #5

## 2022-06-22 ENCOUNTER — Ambulatory Visit: Admit: 2022-06-22 | Discharge: 2022-06-22 | Payer: PRIVATE HEALTH INSURANCE

## 2022-06-22 MED ADMIN — gadobenate dimeglumine (MULTIHANCE) 529 mg/mL (0.1mmol/0.2mL) solution 10 mL: 10 mL | INTRAVENOUS | @ 16:00:00 | Stop: 2022-06-22

## 2022-06-22 NOTE — Unmapped (Signed)
[facility]     CONSULTING PROVIDERS  Dr. Rayetta Humphrey - Radiation Oncology  Dr. Rico Junker, [facility] Surgical Oncology   Dr. 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 FOLFOX x 8  05/2018 chemoradiotherapy to primary.   -Clinical complete response in liver and rectum, managed with Watch and Wait approach  03/2019 recurrent disease in rectum   05/2019 proctectomy with coloanal anastomosis, liver wedge resection (seg 3): liver with fibrosis, rectum invasive mod to poorly differentiated adenocarcinoma, 3 cm; into muscularis propria, no LVI, tumor at distal margin in area of tearing superceded by additional specimen, 0/16 nodes, two with prominent treatment effect. YpT2N0.  - complicated by abdominal abscess with MRSA  08/02/19 closure of diverting ileostomy  02/2020 2 sites of recurrence disease in liver adjacent to GB fossa  04/14/20 seg 4b wedge resection: two foci of adenocarcinoma c/w colorectal primary. Margin +; node negative   01/2021 SBRT 5000cGy in 4fx to met near GB fossa  06/2021 -09/2021 pelvic + possible right abd wall recurrence FOLFOX x 6  11/05/2021 tucatinib-trastuzumab start [baseline echo nl]  - c1 LFTs 4x ULN. Improved quickly, resumed at tucatinib 250 bid    MOLECULAR PROFILE   TEMPUS from 06/2021 rectal mass biopsy:  HER2 amplified  TP53 and APC mutation  RAS/RAF WT  TMB 6.3, MSS    Treatment plan:  tucatinib + trastuzumab. Consider metastectomy q6-35m pending response    ___________________________________________________________________    ASSESSMENT  1. 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 Stable.   2. Type I DM- controlled on insulin pump, no complications from DM  3. Elevated transaminases, improved from prior, CTM  4. ECOG 0    RECOMMENDATIONS  1. Tucatinib 250 mg BID + trastuzumab   2. RTC in 3 weeks for treatment, 6 weeks visit    DISCUSSION  Continues to do great.  No need for dose mods. No signs/symptoms concerning for cardiac tox    Medical decision making based high complexity of cancer and treatment risk of complications and monitoring and management of toxicity of treatment.     ______________________________________________________________    Interval History  Max Stewart returns today for ongoing management of his rectal cancer with liver mets.    Same disordered bowels since his crc surgery- doesn't feel the tucatinib has made it any worse.   Also more fatigue than baseline but doing pretty well, going to work, staying active. Able to do anything he wants.   No rash  No edema  No dyspnea    MEDICAL HISTORY  1. Type I DM- diagnosed at age 37, on insulin     SOCIAL HISTORY   Denies smoking and drugs. Drinks occasional alcohol. 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  GENERAL: well developed, well nourished, in no distress  PSYCH: full and appropriate range of affect with good insight and judgement  HEENT: NCAT, pupils equal, sclerae anicteric  SKIN: No rashes    OBJECTIVE DATA  Reviewed in Epic     RADIOLOGY RESULTS   MRI from yesterday (Scans personally reviewed by me and my interpretation as follows)   - presacral thickening  very similar  - nodule in abd wall similar  Nothing new

## 2022-06-24 ENCOUNTER — Ambulatory Visit
Admit: 2022-06-24 | Discharge: 2022-06-24 | Payer: PRIVATE HEALTH INSURANCE | Attending: Hematology & Oncology | Primary: Hematology & Oncology

## 2022-06-24 ENCOUNTER — Institutional Professional Consult (permissible substitution): Admit: 2022-06-24 | Discharge: 2022-06-24 | Payer: PRIVATE HEALTH INSURANCE

## 2022-06-24 ENCOUNTER — Ambulatory Visit: Admit: 2022-06-24 | Discharge: 2022-06-24 | Payer: PRIVATE HEALTH INSURANCE

## 2022-06-24 DIAGNOSIS — C7989 Secondary malignant neoplasm of other specified sites: Principal | ICD-10-CM

## 2022-06-24 DIAGNOSIS — C20 Malignant neoplasm of rectum: Principal | ICD-10-CM

## 2022-06-24 LAB — CBC W/ AUTO DIFF
BASOPHILS ABSOLUTE COUNT: 0 10*9/L (ref 0.0–0.1)
BASOPHILS RELATIVE PERCENT: 0.8 %
EOSINOPHILS ABSOLUTE COUNT: 0.1 10*9/L (ref 0.0–0.5)
EOSINOPHILS RELATIVE PERCENT: 3.3 %
HEMATOCRIT: 40.6 % (ref 39.0–48.0)
HEMOGLOBIN: 13.8 g/dL (ref 12.9–16.5)
LYMPHOCYTES ABSOLUTE COUNT: 1 10*9/L — ABNORMAL LOW (ref 1.1–3.6)
LYMPHOCYTES RELATIVE PERCENT: 29.8 %
MEAN CORPUSCULAR HEMOGLOBIN CONC: 34.1 g/dL (ref 32.0–36.0)
MEAN CORPUSCULAR HEMOGLOBIN: 30.3 pg (ref 25.9–32.4)
MEAN CORPUSCULAR VOLUME: 88.8 fL (ref 77.6–95.7)
MEAN PLATELET VOLUME: 9.4 fL (ref 6.8–10.7)
MONOCYTES ABSOLUTE COUNT: 0.3 10*9/L (ref 0.3–0.8)
MONOCYTES RELATIVE PERCENT: 8.1 %
NEUTROPHILS ABSOLUTE COUNT: 2 10*9/L (ref 1.8–7.8)
NEUTROPHILS RELATIVE PERCENT: 58 %
NUCLEATED RED BLOOD CELLS: 0 /100{WBCs} (ref <=4)
PLATELET COUNT: 147 10*9/L — ABNORMAL LOW (ref 150–450)
RED BLOOD CELL COUNT: 4.57 10*12/L (ref 4.26–5.60)
RED CELL DISTRIBUTION WIDTH: 13.1 % (ref 12.2–15.2)
WBC ADJUSTED: 3.4 10*9/L — ABNORMAL LOW (ref 3.6–11.2)

## 2022-06-24 LAB — COMPREHENSIVE METABOLIC PANEL
ALBUMIN: 3.6 g/dL (ref 3.4–5.0)
ALKALINE PHOSPHATASE: 136 U/L — ABNORMAL HIGH (ref 46–116)
ALT (SGPT): 58 U/L — ABNORMAL HIGH (ref 10–49)
ANION GAP: 7 mmol/L (ref 5–14)
AST (SGOT): 36 U/L — ABNORMAL HIGH (ref <=34)
BILIRUBIN TOTAL: 0.7 mg/dL (ref 0.3–1.2)
BLOOD UREA NITROGEN: 22 mg/dL (ref 9–23)
BUN / CREAT RATIO: 21
CALCIUM: 8.6 mg/dL — ABNORMAL LOW (ref 8.7–10.4)
CHLORIDE: 108 mmol/L — ABNORMAL HIGH (ref 98–107)
CO2: 27.1 mmol/L (ref 20.0–31.0)
CREATININE: 1.03 mg/dL
EGFR CKD-EPI (2021) MALE: 87 mL/min/{1.73_m2} (ref >=60)
GLUCOSE RANDOM: 161 mg/dL (ref 70–179)
POTASSIUM: 4.1 mmol/L (ref 3.4–4.8)
PROTEIN TOTAL: 6.4 g/dL (ref 5.7–8.2)
SODIUM: 142 mmol/L (ref 135–145)

## 2022-06-24 LAB — CEA: CARCINOEMBRYONIC ANTIGEN: 1.6 ng/mL (ref 0.0–5.0)

## 2022-06-24 MED ADMIN — trastuzumab-dkst (OGIVRI) 630 mg in sodium chloride (NS) 0.9 % 250 mL IVPB: 6 mg/kg | INTRAVENOUS | @ 16:00:00 | Stop: 2022-06-24

## 2022-06-24 MED ADMIN — sodium chloride (NS) 0.9 % infusion: 100 mL/h | INTRAVENOUS | @ 16:00:00 | Stop: 2022-06-24

## 2022-06-24 MED ADMIN — heparin, porcine (PF) 100 unit/mL injection 500 Units: 500 [IU] | INTRAVENOUS | @ 16:00:00 | Stop: 2022-06-24

## 2022-06-24 NOTE — Unmapped (Signed)
Patient arrived in the infusion clinic at 1059. Weight and vitals were obtained. Port was accessed during nurse visit, RN flushed with blood return, dressing clean, dry and intact. Patient was administered chemotherapy regimen as ordered without complications while in the clinic. Pt port heparin flushed and deaccessed, covered with 2x2 gauze and bandaid. Patient \\discharged  home to self care.

## 2022-07-08 DIAGNOSIS — C20 Malignant neoplasm of rectum: Principal | ICD-10-CM

## 2022-07-08 MED ORDER — TUCATINIB 50 MG TABLET
ORAL_TABLET | Freq: Two times a day (BID) | ORAL | 6 refills | 30 days | Status: CP
Start: 2022-07-08 — End: ?

## 2022-07-08 MED ORDER — TUCATINIB 150 MG TABLET
ORAL_TABLET | Freq: Two times a day (BID) | ORAL | 6 refills | 30 days | Status: CP
Start: 2022-07-08 — End: ?
  Filled 2022-07-13: qty 60, 30d supply, fill #0

## 2022-07-12 NOTE — Unmapped (Signed)
[facility] Refill Coordination Note    Specialty Medication(s) to be Shipped:   Hematology/Oncology: Max Stewart 150mg  and 50mg     Other medication(s) to be shipped: No additional medications requested for fill at this time     Max Stewart, DOB: [birth year]  Phone: [phone] (home) [phone] (work)      All above HIPAA information was verified with patient.     Was a Nurse, learning disability used for this call? No    Completed refill call assessment today to schedule patient's medication shipment from the [facility] [phone]).  All relevant notes have been reviewed.     Specialty medication(s) and dose(s) confirmed: Regimen is correct and unchanged.   Changes to medications: [name]  Changes to insurance: No  New side effects reported not previously addressed with a pharmacist or physician: None reported  Questions for the pharmacist: No    Confirmed patient received a Conservation officer, historic buildings and a Surveyor, mining with first shipment. The patient will receive a drug information handout for each medication shipped and additional FDA Medication Guides as required.       DISEASE/MEDICATION-SPECIFIC INFORMATION        N/A    SPECIALTY MEDICATION ADHERENCE     Medication Adherence    Patient reported X missed doses in the last month: 0  Specialty Medication: Tukysa 50mg   Patient is on additional specialty medications: Yes  Additional Specialty Medications: Tukysa 150mg   Patient Reported Additional Medication X Missed Doses in the Last Month: 0  Patient is on more than two specialty medications: No  Informant: patient              Were doses missed due to medication being on hold? No    Tukysa 150mg   : unable to confirm quantity on hand  Tukysa 50mg   : unable to confirm quantity on hand    REFERRAL TO PHARMACIST     Referral to the pharmacist: Not needed      [facility]     Shipping address confirmed in Epic.       Delivery Scheduled: Yes, Expected medication delivery date: 6/5.     Medication will be delivered via Next Day Courier to the prescription address in Epic [place].    Westley Gambles   [facility] Specialty Technician

## 2022-07-13 MED FILL — TUKYSA 50 MG TABLET: ORAL | 30 days supply | Qty: 120 | Fill #0

## 2022-07-15 ENCOUNTER — Ambulatory Visit: Admit: 2022-07-15 | Discharge: 2022-07-16 | Payer: PRIVATE HEALTH INSURANCE

## 2022-07-15 DIAGNOSIS — C20 Malignant neoplasm of rectum: Principal | ICD-10-CM

## 2022-07-15 LAB — CBC W/ AUTO DIFF
BASOPHILS ABSOLUTE COUNT: 0 10*9/L (ref 0.0–0.1)
BASOPHILS RELATIVE PERCENT: 0.5 %
EOSINOPHILS ABSOLUTE COUNT: 0.1 10*9/L (ref 0.0–0.5)
EOSINOPHILS RELATIVE PERCENT: 1.2 %
HEMATOCRIT: 40.5 % (ref 39.0–48.0)
HEMOGLOBIN: 13.9 g/dL (ref 12.9–16.5)
LYMPHOCYTES ABSOLUTE COUNT: 1.1 10*9/L (ref 1.1–3.6)
LYMPHOCYTES RELATIVE PERCENT: 22.8 %
MEAN CORPUSCULAR HEMOGLOBIN CONC: 34.2 g/dL (ref 32.0–36.0)
MEAN CORPUSCULAR HEMOGLOBIN: 30.3 pg (ref 25.9–32.4)
MEAN CORPUSCULAR VOLUME: 88.8 fL (ref 77.6–95.7)
MEAN PLATELET VOLUME: 10 fL (ref 6.8–10.7)
MONOCYTES ABSOLUTE COUNT: 0.3 10*9/L (ref 0.3–0.8)
MONOCYTES RELATIVE PERCENT: 6.3 %
NEUTROPHILS ABSOLUTE COUNT: 3.5 10*9/L (ref 1.8–7.8)
NEUTROPHILS RELATIVE PERCENT: 69.2 %
NUCLEATED RED BLOOD CELLS: 0 /100{WBCs} (ref <=4)
PLATELET COUNT: 146 10*9/L — ABNORMAL LOW (ref 150–450)
RED BLOOD CELL COUNT: 4.57 10*12/L (ref 4.26–5.60)
RED CELL DISTRIBUTION WIDTH: 13.5 % (ref 12.2–15.2)
WBC ADJUSTED: 5 10*9/L (ref 3.6–11.2)

## 2022-07-15 LAB — COMPREHENSIVE METABOLIC PANEL
ALBUMIN: 3.8 g/dL (ref 3.4–5.0)
ALKALINE PHOSPHATASE: 126 U/L — ABNORMAL HIGH (ref 46–116)
ALT (SGPT): 45 U/L (ref 10–49)
ANION GAP: 6 mmol/L (ref 5–14)
AST (SGOT): 34 U/L (ref <=34)
BILIRUBIN TOTAL: 1.2 mg/dL (ref 0.3–1.2)
BLOOD UREA NITROGEN: 23 mg/dL (ref 9–23)
BUN / CREAT RATIO: 21
CALCIUM: 8.8 mg/dL (ref 8.7–10.4)
CHLORIDE: 107 mmol/L (ref 98–107)
CO2: 26.4 mmol/L (ref 20.0–31.0)
CREATININE: 1.07 mg/dL
EGFR CKD-EPI (2021) MALE: 83 mL/min/{1.73_m2} (ref >=60)
GLUCOSE RANDOM: 176 mg/dL (ref 70–179)
POTASSIUM: 4.5 mmol/L (ref 3.4–4.8)
PROTEIN TOTAL: 6.6 g/dL (ref 5.7–8.2)
SODIUM: 139 mmol/L (ref 135–145)

## 2022-07-15 LAB — CEA: CARCINOEMBRYONIC ANTIGEN: 1.4 ng/mL (ref 0.0–5.0)

## 2022-07-15 MED ADMIN — trastuzumab-dkst (OGIVRI) 630 mg in sodium chloride (NS) 0.9 % 250 mL IVPB: 6 mg/kg | INTRAVENOUS | @ 16:00:00 | Stop: 2022-07-15

## 2022-07-15 MED ADMIN — heparin, porcine (PF) 100 unit/mL injection 500 Units: 500 [IU] | INTRAVENOUS | @ 17:00:00 | Stop: 2022-07-15

## 2022-07-15 MED ADMIN — sodium chloride (NS) 0.9 % infusion: 100 mL/h | INTRAVENOUS | @ 16:00:00 | Stop: 2022-07-15

## 2022-07-15 NOTE — Unmapped (Signed)
Infusion on 07/15/2022   Component Date Value Ref Range Status    WBC 07/15/2022 5.0  3.6 - 11.2 10*9/L Final    RBC 07/15/2022 4.57  4.26 - 5.60 10*12/L Final    HGB 07/15/2022 13.9  12.9 - 16.5 g/dL Final    HCT 16/11/9602 40.5  39.0 - 48.0 % Final    MCV 07/15/2022 88.8  77.6 - 95.7 fL Final    MCH 07/15/2022 30.3  25.9 - 32.4 pg Final    MCHC 07/15/2022 34.2  32.0 - 36.0 g/dL Final    RDW 54/10/8117 13.5  12.2 - 15.2 % Final    MPV 07/15/2022 10.0  6.8 - 10.7 fL Final    Platelet 07/15/2022 146 (L)  150 - 450 10*9/L Final    nRBC 07/15/2022 0  <=4 /100 WBCs Final    Neutrophils % 07/15/2022 69.2  % Final    Lymphocytes % 07/15/2022 22.8  % Final    Monocytes % 07/15/2022 6.3  % Final    Eosinophils % 07/15/2022 1.2  % Final    Basophils % 07/15/2022 0.5  % Final    Absolute Neutrophils 07/15/2022 3.5  1.8 - 7.8 10*9/L Final    Absolute Lymphocytes 07/15/2022 1.1  1.1 - 3.6 10*9/L Final    Absolute Monocytes 07/15/2022 0.3  0.3 - 0.8 10*9/L Final    Absolute Eosinophils 07/15/2022 0.1  0.0 - 0.5 10*9/L Final    Absolute Basophils 07/15/2022 0.0  0.0 - 0.1 10*9/L Final

## 2022-07-15 NOTE — Unmapped (Signed)
Patient arrived in the infusion clinic at [facility].  Weight and vitals were obtained. Port was accessed by infusion nurse. RN flushed with NS, brisk blood return noted. Dressing clean, dry, and intact.  Labs were drawn  during infusion visit.  Patient was administered trastuzumab as ordered without complications while in the clinic. Port with brisk blood return following infusion. Flushed with NS and heparin per protocol and deaccessed. Gauze and bandaid placed over site. Patient declined after visit summary then discharged home to self care.

## 2022-08-04 NOTE — Unmapped (Signed)
[facility]     CONSULTING PROVIDERS  Dr. Rayetta Humphrey - Radiation Oncology  Dr. Rico Junker, [facility] Surgical Oncology   Dr. Pearlean Brownie, [facility] Surgical Oncology  ____________________________________________________________________    CANCER HISTORY  Diagnosis 10/12/17 metastatic Rectal Adenocarcinoma with synchronous liver mets, work-up with a 4cm rectal mass and 5 metastatic hepatic lesions    11/2017-03/2018. FOLFOX x 8  05/2018 chemoradiotherapy to primary.   -Clinical complete response in liver and rectum, managed with Watch and Wait approach  03/2019 recurrent disease in rectum   05/2019 proctectomy with coloanal anastomosis, liver wedge resection (seg 3): liver with fibrosis, rectum invasive mod to poorly differentiated adenocarcinoma, 3 cm; into muscularis propria, no LVI, tumor at distal margin in area of tearing superceded by additional specimen, 0/16 nodes, two with prominent treatment effect. YpT2N0.  - complicated by abdominal abscess with MRSA  08/02/19 closure of diverting ileostomy  02/2020 2 sites of recurrence disease in liver adjacent to GB fossa  04/14/20 seg 4b wedge resection: two foci of adenocarcinoma c/w colorectal primary. Margin +; node negative   01/2021 SBRT 5000cGy in 10fx to met near GB fossa  06/2021 -09/2021 pelvic + possible right abd wall recurrence FOLFOX x 6  11/05/2021 tucatinib-trastuzumab start [baseline echo nl]  - c1 LFTs 4x ULN. Improved quickly, resumed at tucatinib 250 bid    MOLECULAR PROFILE   TEMPUS from 06/2021 rectal mass biopsy:  HER2 amplified  TP53 and APC mutation  RAS/RAF WT  TMB 6.3, MSS    Treatment plan:  tucatinib + trastuzumab. Consider metastectomy q6-80m pending response    ___________________________________________________________________    ASSESSMENT  1. 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 Stable.   2. Type I DM- controlled on insulin pump, no complications from DM  3. Elevated transaminases, improved from prior, CTM  4. ECOG 0    RECOMMENDATIONS  1. Tucatinib 250 mg BID + trastuzumab   2. RTC in 3 weeks for treatment, 6 weeks scans, labs, visit     DISCUSSION  Max Stewart is doing well on treatment, continue same treatment plan.  LFTs look good, normal today.    Medical decision making based high complexity of cancer and treatment risk of complications and monitoring and management of toxicity of treatment.     ______________________________________________________________    Interval History  Max Stewart returns today for ongoing management of his rectal cancer with liver mets.    Max Stewart comes in today doing well.  No new issues.  Feels he is tolerating treatment well.    Bowels continue to be irregular from surgery,unpredictable, continues on Imodium 2 tabs BID. Feels he is managing.    Energy ok.  Eating ok.  No pain.  No SOB.    MEDICAL HISTORY  1. Type I DM- diagnosed at age 52, on insulin     SOCIAL HISTORY   Denies smoking and drugs. Drinks occasional alcohol. 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

## 2022-08-05 ENCOUNTER — Institutional Professional Consult (permissible substitution): Admit: 2022-08-05 | Discharge: 2022-08-06 | Payer: PRIVATE HEALTH INSURANCE

## 2022-08-05 ENCOUNTER — Ambulatory Visit: Admit: 2022-08-05 | Discharge: 2022-08-06 | Payer: PRIVATE HEALTH INSURANCE

## 2022-08-05 DIAGNOSIS — C20 Malignant neoplasm of rectum: Principal | ICD-10-CM

## 2022-08-05 DIAGNOSIS — C7989 Secondary malignant neoplasm of other specified sites: Principal | ICD-10-CM

## 2022-08-05 DIAGNOSIS — E109 Type 1 diabetes mellitus without complications: Principal | ICD-10-CM

## 2022-08-05 DIAGNOSIS — Z79899 Other long term (current) drug therapy: Principal | ICD-10-CM

## 2022-08-05 LAB — COMPREHENSIVE METABOLIC PANEL
ALBUMIN: 3.5 g/dL (ref 3.4–5.0)
ALKALINE PHOSPHATASE: 96 U/L (ref 46–116)
ALT (SGPT): 23 U/L (ref 10–49)
ANION GAP: 4 mmol/L — ABNORMAL LOW (ref 5–14)
AST (SGOT): 26 U/L (ref <=34)
BILIRUBIN TOTAL: 0.8 mg/dL (ref 0.3–1.2)
BLOOD UREA NITROGEN: 22 mg/dL (ref 9–23)
BUN / CREAT RATIO: 23
CALCIUM: 8.5 mg/dL — ABNORMAL LOW (ref 8.7–10.4)
CHLORIDE: 109 mmol/L — ABNORMAL HIGH (ref 98–107)
CO2: 27.8 mmol/L (ref 20.0–31.0)
CREATININE: 0.94 mg/dL
EGFR CKD-EPI (2021) MALE: >90 mL/min/{1.73_m2} (ref >=60)
GLUCOSE RANDOM: 239 mg/dL — ABNORMAL HIGH (ref 70–179)
POTASSIUM: 4.4 mmol/L (ref 3.4–4.8)
PROTEIN TOTAL: 6 g/dL (ref 5.7–8.2)
SODIUM: 141 mmol/L (ref 135–145)

## 2022-08-05 LAB — CBC W/ AUTO DIFF
BASOPHILS ABSOLUTE COUNT: 0 10*9/L (ref 0.0–0.1)
BASOPHILS RELATIVE PERCENT: 0.6 %
EOSINOPHILS ABSOLUTE COUNT: 0.1 10*9/L (ref 0.0–0.5)
EOSINOPHILS RELATIVE PERCENT: 2 %
HEMATOCRIT: 39.5 % (ref 39.0–48.0)
HEMOGLOBIN: 13.5 g/dL (ref 12.9–16.5)
LYMPHOCYTES ABSOLUTE COUNT: 1.1 10*9/L (ref 1.1–3.6)
LYMPHOCYTES RELATIVE PERCENT: 29.6 %
MEAN CORPUSCULAR HEMOGLOBIN CONC: 34.1 g/dL (ref 32.0–36.0)
MEAN CORPUSCULAR HEMOGLOBIN: 30.3 pg (ref 25.9–32.4)
MEAN CORPUSCULAR VOLUME: 89 fL (ref 77.6–95.7)
MEAN PLATELET VOLUME: 10 fL (ref 6.8–10.7)
MONOCYTES ABSOLUTE COUNT: 0.3 10*9/L (ref 0.3–0.8)
MONOCYTES RELATIVE PERCENT: 7.4 %
NEUTROPHILS ABSOLUTE COUNT: 2.2 10*9/L (ref 1.8–7.8)
NEUTROPHILS RELATIVE PERCENT: 60.4 %
NUCLEATED RED BLOOD CELLS: 0 /100{WBCs} (ref <=4)
PLATELET COUNT: 138 10*9/L — ABNORMAL LOW (ref 150–450)
RED BLOOD CELL COUNT: 4.44 10*12/L (ref 4.26–5.60)
RED CELL DISTRIBUTION WIDTH: 13.1 % (ref 12.2–15.2)
WBC ADJUSTED: 3.7 10*9/L (ref 3.6–11.2)

## 2022-08-05 LAB — CEA: CARCINOEMBRYONIC ANTIGEN: 1.8 ng/mL (ref 0.0–5.0)

## 2022-08-05 MED ADMIN — heparin, porcine (PF) 100 unit/mL injection 500 Units: 500 [IU] | INTRAVENOUS | @ 19:00:00 | Stop: 2022-08-05

## 2022-08-05 MED ADMIN — trastuzumab-dkst (OGIVRI) 630 mg in sodium chloride (NS) 0.9 % 250 mL IVPB: 6 mg/kg | INTRAVENOUS | @ 18:00:00 | Stop: 2022-08-05

## 2022-08-05 MED ADMIN — sodium chloride (NS) 0.9 % infusion: 100 mL/h | INTRAVENOUS | @ 18:00:00 | Stop: 2022-08-05

## 2022-08-05 MED ADMIN — heparin, porcine (PF) 100 unit/mL injection 500 Units: 500 [IU] | INTRAVENOUS | @ 15:00:00 | Stop: 2022-08-05

## 2022-08-05 NOTE — Unmapped (Signed)
Clinical Support on 08/05/2022   Component Date Value Ref Range Status    Sodium 08/05/2022 141  135 - 145 mmol/L Final    Potassium 08/05/2022 4.4  3.4 - 4.8 mmol/L Final    Chloride 08/05/2022 109 (H)  98 - 107 mmol/L Final    CO2 08/05/2022 27.8  20.0 - 31.0 mmol/L Final    Anion Gap 08/05/2022 4 (L)  5 - 14 mmol/L Final    BUN 08/05/2022 22  9 - 23 mg/dL Final    Creatinine 16/11/9602 0.94  0.73 - 1.18 mg/dL Final    BUN/Creatinine Ratio 08/05/2022 23   Final    eGFR CKD-EPI (2021) Male 08/05/2022 >90  >=60 mL/min/1.29m2 Final    eGFR calculated with CKD-EPI 2021 equation in accordance with [employer] and [employer] of Nephrology Task Force recommendations.    Glucose 08/05/2022 239 (H)  70 - 179 mg/dL Final    Calcium 54/10/8117 8.5 (L)  8.7 - 10.4 mg/dL Final    Albumin 14/78/2956 3.5  3.4 - 5.0 g/dL Final    Total Protein 08/05/2022 6.0  5.7 - 8.2 g/dL Final    Total Bilirubin 08/05/2022 0.8  0.3 - 1.2 mg/dL Final    AST 21/30/8657 26  <=34 U/L Final    ALT 08/05/2022 23  10 - 49 U/L Final    Alkaline Phosphatase 08/05/2022 96  46 - 116 U/L Final    WBC 08/05/2022 3.7  3.6 - 11.2 10*9/L Final    RBC 08/05/2022 4.44  4.26 - 5.60 10*12/L Final    HGB 08/05/2022 13.5  12.9 - 16.5 g/dL Final    HCT 84/69/6295 39.5  39.0 - 48.0 % Final    MCV 08/05/2022 89.0  77.6 - 95.7 fL Final    MCH 08/05/2022 30.3  25.9 - 32.4 pg Final    MCHC 08/05/2022 34.1  32.0 - 36.0 g/dL Final    RDW 28/41/3244 13.1  12.2 - 15.2 % Final    MPV 08/05/2022 10.0  6.8 - 10.7 fL Final    Platelet 08/05/2022 138 (L)  150 - 450 10*9/L Final    nRBC 08/05/2022 0  <=4 /100 WBCs Final    Neutrophils % 08/05/2022 60.4  % Final    Lymphocytes % 08/05/2022 29.6  % Final    Monocytes % 08/05/2022 7.4  % Final    Eosinophils % 08/05/2022 2.0  % Final    Basophils % 08/05/2022 0.6  % Final    Absolute Neutrophils 08/05/2022 2.2  1.8 - 7.8 10*9/L Final    Absolute Lymphocytes 08/05/2022 1.1  1.1 - 3.6 10*9/L Final    Absolute Monocytes 08/05/2022 0.3  0.3 - 0.8 10*9/L Final    Absolute Eosinophils 08/05/2022 0.1  0.0 - 0.5 10*9/L Final    Absolute Basophils 08/05/2022 0.0  0.0 - 0.1 10*9/L Final

## 2022-08-05 NOTE — Unmapped (Signed)
Patient arrived in the infusion clinic at 1315.  Weight and vitals were obtained. Port was accessed during clinic nurse visit. RN flushed with NS, brisk blood return noted. Dressing clean, dry, and intact.  Labs were drawn  during clinic nurse visit.  Patient was administered trastuzumab as ordered without complications while in the clinic. Port with brisk blood return following infusion. Flushed with NS and heparin per protocol and deaccessed. Gauze and bandaid placed over site. Patient discharged home to self care.

## 2022-08-06 NOTE — Unmapped (Signed)
Outgoing call to Patient regarding scheduling of MRI. Asked MRI questions patient stated No to all questions. Went over appts with Patient. Patient agreeable to dates and time.

## 2022-08-20 NOTE — Unmapped (Signed)
[facility] Refill Coordination Note    Specialty Medication(s) to be Shipped:   Hematology/Oncology: Tukysa 50 mg and Tukysa 150 mg    Other medication(s) to be shipped: No additional medications requested for fill at this time     Max Stewart, DOB: 05/09/1970  Phone: [phone] (home) [phone] (work)      All above HIPAA information was verified with patient.     Was a Nurse, learning disability used for this call? No    Completed refill call assessment today to schedule patient's medication shipment from the [facility] [phone]).  All relevant notes have been reviewed.     Specialty medication(s) and dose(s) confirmed: Regimen is correct and unchanged.   Changes to medications: Duard reports no changes at this time.  Changes to insurance: No  New side effects reported not previously addressed with a pharmacist or physician: None reported  Questions for the pharmacist: No    Confirmed patient received a Conservation officer, historic buildings and a Surveyor, mining with first shipment. The patient will receive a drug information handout for each medication shipped and additional FDA Medication Guides as required.       DISEASE/MEDICATION-SPECIFIC INFORMATION        N/A    SPECIALTY MEDICATION ADHERENCE     Medication Adherence    Patient reported X missed doses in the last month: 0  Specialty Medication: Tukysa 50mg   Patient is on additional specialty medications: Yes  Additional Specialty Medications: Tukysa 150mg   Patient Reported Additional Medication X Missed Doses in the Last Month: 0  Patient is on more than two specialty medications: No  Informant: patient              Were doses missed due to medication being on hold? No    Tukysa 50 mg: 5 days of medicine on hand   Tukysa 150 mg: 5 days of medicine on hand        REFERRAL TO PHARMACIST     Referral to the pharmacist: Not needed      [facility]     Shipping address confirmed in Epic.       Delivery Scheduled: Yes, Expected medication delivery date: 08/25/22.     Medication will be delivered via Next Day Courier to the prescription address in Epic WAM.    Willette Pa   [facility] Specialty Technician

## 2022-08-24 MED FILL — TUKYSA 150 MG TABLET: ORAL | 30 days supply | Qty: 60 | Fill #1

## 2022-08-24 MED FILL — TUKYSA 50 MG TABLET: ORAL | 30 days supply | Qty: 120 | Fill #1

## 2022-08-26 ENCOUNTER — Ambulatory Visit: Admit: 2022-08-26 | Discharge: 2022-08-27 | Payer: PRIVATE HEALTH INSURANCE

## 2022-08-26 DIAGNOSIS — C20 Malignant neoplasm of rectum: Principal | ICD-10-CM

## 2022-08-26 LAB — CBC W/ AUTO DIFF
BASOPHILS ABSOLUTE COUNT: 0 10*9/L (ref 0.0–0.1)
BASOPHILS RELATIVE PERCENT: 0.8 %
EOSINOPHILS ABSOLUTE COUNT: 0.1 10*9/L (ref 0.0–0.5)
EOSINOPHILS RELATIVE PERCENT: 1.8 %
HEMATOCRIT: 40.4 % (ref 39.0–48.0)
HEMOGLOBIN: 13.7 g/dL (ref 12.9–16.5)
LYMPHOCYTES ABSOLUTE COUNT: 1.4 10*9/L (ref 1.1–3.6)
LYMPHOCYTES RELATIVE PERCENT: 28.3 %
MEAN CORPUSCULAR HEMOGLOBIN CONC: 33.9 g/dL (ref 32.0–36.0)
MEAN CORPUSCULAR HEMOGLOBIN: 30.5 pg (ref 25.9–32.4)
MEAN CORPUSCULAR VOLUME: 90 fL (ref 77.6–95.7)
MEAN PLATELET VOLUME: 9.9 fL (ref 6.8–10.7)
MONOCYTES ABSOLUTE COUNT: 0.4 10*9/L (ref 0.3–0.8)
MONOCYTES RELATIVE PERCENT: 7.8 %
NEUTROPHILS ABSOLUTE COUNT: 2.9 10*9/L (ref 1.8–7.8)
NEUTROPHILS RELATIVE PERCENT: 61.3 %
NUCLEATED RED BLOOD CELLS: 0 /100{WBCs} (ref <=4)
PLATELET COUNT: 137 10*9/L — ABNORMAL LOW (ref 150–450)
RED BLOOD CELL COUNT: 4.49 10*12/L (ref 4.26–5.60)
RED CELL DISTRIBUTION WIDTH: 13.4 % (ref 12.2–15.2)
WBC ADJUSTED: 4.8 10*9/L (ref 3.6–11.2)

## 2022-08-26 LAB — COMPREHENSIVE METABOLIC PANEL
ALBUMIN: 3.6 g/dL (ref 3.4–5.0)
ALKALINE PHOSPHATASE: 93 U/L (ref 46–116)
ALT (SGPT): 27 U/L (ref 10–49)
ANION GAP: 3 mmol/L — ABNORMAL LOW (ref 5–14)
AST (SGOT): 27 U/L (ref <=34)
BILIRUBIN TOTAL: 0.7 mg/dL (ref 0.3–1.2)
BLOOD UREA NITROGEN: 22 mg/dL (ref 9–23)
BUN / CREAT RATIO: 22
CALCIUM: 8.7 mg/dL (ref 8.7–10.4)
CHLORIDE: 109 mmol/L — ABNORMAL HIGH (ref 98–107)
CO2: 27.5 mmol/L (ref 20.0–31.0)
CREATININE: 1.01 mg/dL
EGFR CKD-EPI (2021) MALE: 89 mL/min/{1.73_m2} (ref >=60)
GLUCOSE RANDOM: 173 mg/dL (ref 70–179)
POTASSIUM: 4.4 mmol/L (ref 3.4–4.8)
PROTEIN TOTAL: 6.2 g/dL (ref 5.7–8.2)
SODIUM: 139 mmol/L (ref 135–145)

## 2022-08-26 LAB — CEA: CARCINOEMBRYONIC ANTIGEN: 1.7 ng/mL (ref 0.0–5.0)

## 2022-08-26 MED ADMIN — sodium chloride (NS) 0.9 % infusion: 100 mL/h | INTRAVENOUS | @ 18:00:00 | Stop: 2022-08-26 | NDC 05928011001

## 2022-08-26 MED ADMIN — heparin, porcine (PF) 100 unit/mL injection 500 Units: 500 [IU] | INTRAVENOUS | @ 19:00:00 | Stop: 2022-08-26 | NDC 00009029101

## 2022-08-26 MED ADMIN — trastuzumab-qyyp (TRAZIMERA) 600 mg in sodium chloride (NS) 0.9 % 250 mL IVPB: 6 mg/kg | INTRAVENOUS | @ 18:00:00 | Stop: 2022-08-26 | NDC 00069030601

## 2022-08-26 NOTE — Unmapped (Addendum)
Infusion on 08/26/2022   Component Date Value Ref Range Status    Sodium 08/26/2022 139  135 - 145 mmol/L Final    Potassium 08/26/2022 4.4  3.4 - 4.8 mmol/L Final    Chloride 08/26/2022 109 (H)  98 - 107 mmol/L Final    CO2 08/26/2022 27.5  20.0 - 31.0 mmol/L Final    Anion Gap 08/26/2022 3 (L)  5 - 14 mmol/L Final    BUN 08/26/2022 22  9 - 23 mg/dL Final    Creatinine 65/78/4696 1.01  0.73 - 1.18 mg/dL Final    BUN/Creatinine Ratio 08/26/2022 22   Final    eGFR CKD-EPI (2021) Male 08/26/2022 89  >=60 mL/min/1.73m2 Final    eGFR calculated with CKD-EPI 2021 equation in accordance with [employer] and [employer] of Nephrology Task Force recommendations.    Glucose 08/26/2022 173  70 - 179 mg/dL Final    Calcium 29/52/8413 8.7  8.7 - 10.4 mg/dL Final    Albumin 24/40/1027 3.6  3.4 - 5.0 g/dL Final    Total Protein 08/26/2022 6.2  5.7 - 8.2 g/dL Final    Total Bilirubin 08/26/2022 0.7  0.3 - 1.2 mg/dL Final    AST 25/36/6440 27  <=34 U/L Final    ALT 08/26/2022 27  10 - 49 U/L Final    Alkaline Phosphatase 08/26/2022 93  46 - 116 U/L Final    WBC 08/26/2022 4.8  3.6 - 11.2 10*9/L Final    RBC 08/26/2022 4.49  4.26 - 5.60 10*12/L Final    HGB 08/26/2022 13.7  12.9 - 16.5 g/dL Final    HCT 34/74/2595 40.4  39.0 - 48.0 % Final    MCV 08/26/2022 90.0  77.6 - 95.7 fL Final    MCH 08/26/2022 30.5  25.9 - 32.4 pg Final    MCHC 08/26/2022 33.9  32.0 - 36.0 g/dL Final    RDW 63/87/5643 13.4  12.2 - 15.2 % Final    MPV 08/26/2022 9.9  6.8 - 10.7 fL Final    Platelet 08/26/2022 137 (L)  150 - 450 10*9/L Final    nRBC 08/26/2022 0  <=4 /100 WBCs Final    Neutrophils % 08/26/2022 61.3  % Final    Lymphocytes % 08/26/2022 28.3  % Final    Monocytes % 08/26/2022 7.8  % Final    Eosinophils % 08/26/2022 1.8  % Final    Basophils % 08/26/2022 0.8  % Final    Absolute Neutrophils 08/26/2022 2.9  1.8 - 7.8 10*9/L Final    Absolute Lymphocytes 08/26/2022 1.4  1.1 - 3.6 10*9/L Final    Absolute Monocytes 08/26/2022 0.4  0.3 - 0.8 10*9/L Final    Absolute Eosinophils 08/26/2022 0.1  0.0 - 0.5 10*9/L Final    Absolute Basophils 08/26/2022 0.0  0.0 - 0.1 10*9/L Final

## 2022-08-26 NOTE — Unmapped (Signed)
Patient arrived in the infusion clinic at 1300.  Weight and vitals were obtained. Port was accessed by infusion nurse. RN flushed with NS, brisk blood return noted. Dressing clean, dry, and intact.  Labs were drawn  during infusion visit.  Patient was administered trastuzumab as ordered without complications while in the clinic. Port with brisk blood return following infusion. Flushed with NS and heparin per protocol and deaccessed. Gauze and bandaid placed over site. Patient declined after visit summary then discharged home to self care.

## 2022-09-14 ENCOUNTER — Ambulatory Visit: Admit: 2022-09-14 | Discharge: 2022-09-15 | Payer: PRIVATE HEALTH INSURANCE

## 2022-09-14 ENCOUNTER — Ambulatory Visit: Admit: 2022-09-14 | Discharge: 2022-09-14 | Payer: PRIVATE HEALTH INSURANCE

## 2022-09-14 MED ADMIN — gadobenate dimeglumine (MULTIHANCE) 529 mg/mL (0.1mmol/0.2mL) solution 21.1 mL: .1 mmol/kg | INTRAVENOUS | @ 13:00:00 | Stop: 2022-09-14

## 2022-09-15 NOTE — Unmapped (Signed)
[facility]     CONSULTING PROVIDERS  Dr. Rayetta Humphrey - Radiation Oncology  Dr. Rico Junker, [facility] Surgical Oncology   Dr. 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 FOLFOX x 8  05/2018 chemoradiotherapy to primary.   -Clinical complete response in liver and rectum, managed with Watch and Wait approach  03/2019 recurrent disease in rectum   05/2019 proctectomy with coloanal anastomosis, liver wedge resection (seg 3): liver with fibrosis, rectum invasive mod to poorly differentiated adenocarcinoma, 3 cm; into muscularis propria, no LVI, tumor at distal margin in area of tearing superceded by additional specimen, 0/16 nodes, two with prominent treatment effect. YpT2N0.  - complicated by abdominal abscess with MRSA  08/02/19 closure of diverting ileostomy  02/2020 2 sites of recurrence disease in liver adjacent to GB fossa  04/14/20 seg 4b wedge resection: two foci of adenocarcinoma c/w colorectal primary. Margin +; node negative   01/2021 SBRT 5000cGy in 65fx to met near GB fossa  06/2021 -09/2021 pelvic + possible right abd wall recurrence FOLFOX x 6  11/05/2021 tucatinib-trastuzumab start [baseline echo nl]  - c1 LFTs 4x ULN. Improved quickly, resumed at tucatinib 250 bid    MOLECULAR PROFILE   TEMPUS from 06/2021 rectal mass biopsy:  HER2 amplified  TP53 and APC mutation  RAS/RAF WT  TMB 6.3, MSS    Treatment plan:  tucatinib + trastuzumab.      ___________________________________________________________________    ASSESSMENT  1. 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 Stable to minor progression. Indeterminate omental nodule-- previously determined to be fat necrosis   2. Type I DM- controlled on insulin pump, no complications from DM  3. Elevated transaminases, improved from prior, CTM  4. ECOG 0    RECOMMENDATIONS  1. Tucatinib 250 mg BID + trastuzumab   2. RTC in 3 weeks for treatment, 6 weeks scans, labs, visit     DISCUSSION  Max Stewart is doing well on treatment- reviewed scans that show continued good control in pelvis. May have some minor progression in abd wall. Not sure what to make of omental stuff as waxed and waned and previously called fat necrosis-- certainly concerning in setting of multiple recurrences. Will CTM carefully but may well need to change Rx at next scans. His tumor marker isnt of any help.     Medical decision making based high complexity of cancer and treatment risk of complications and monitoring and management of toxicity of treatment.     ______________________________________________________________    Interval History  Max Stewart returns today for ongoing management of his rectal cancer with liver mets.    Doing really well  Same issues with [facility]  Energy fine  No abd pain  No sob, doe, orthopnea, LE edema    MEDICAL HISTORY  1. Type I DM- diagnosed at age 52, on insulin     SOCIAL HISTORY   Denies smoking and drugs. Drinks occasional alcohol. 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    GENERAL: well developed, well nourished, in no distress  PSYCH: full and appropriate range of affect with good insight and judgement  HEENT: NCAT, pupils equal, sclerae  anicteric  SKIN: No rashes  Abd soft, cannot feel the abd wall mass    OBJECTIVE DATA  Reviewed in Epic , CBC, CMP reviewed, look good  LFTs nl    RADIOLOGY RESULTS   Scans personally reviewed by me  - pelvis stable  -  indeterminate omental lesion  - abd wall nodule a little larger, by a few mm longest dimension since feb.

## 2022-09-15 NOTE — Unmapped (Signed)
[facility] Refill Coordination Note    Specialty Medication(s) to be Shipped:   Hematology/Oncology: Tukysa 50 mg and Tukysa 150 mg    Other medication(s) to be shipped: No additional medications requested for fill at this time     Max Stewart, DOB: [birth year]  Phone: [phone] (home) [phone] (work)      All above HIPAA information was verified with patient.     Was a Nurse, learning disability used for this call? No    Completed refill call assessment today to schedule patient's medication shipment from the [facility] [phone]).  All relevant notes have been reviewed.     Specialty medication(s) and dose(s) confirmed: Regimen is correct and unchanged.   Changes to medications: Max Stewart reports no changes at this time.  Changes to insurance: No  New side effects reported not previously addressed with a pharmacist or physician: None reported  Questions for the pharmacist: No    Confirmed patient received a Conservation officer, historic buildings and a Surveyor, mining with first shipment. The patient will receive a drug information handout for each medication shipped and additional FDA Medication Guides as required.       DISEASE/MEDICATION-SPECIFIC INFORMATION        N/A    SPECIALTY MEDICATION ADHERENCE     Medication Adherence    Patient reported X missed doses in the last month: 0  Specialty Medication: TUKYSA 50 mg tablet (tucatinib)  Patient is on additional specialty medications: Yes  Additional Specialty Medications: TUKYSA 150 mg tablet (tucatinib)  Patient Reported Additional Medication X Missed Doses in the Last Month: 0  Patient is on more than two specialty medications: No              Were doses missed due to medication being on hold? No    Tukysa 50 mg: 13 days of medicine on hand   Tukysa 150 mg: 13 days of medicine on hand        REFERRAL TO PHARMACIST     Referral to the pharmacist: Not needed      [facility]     Shipping address confirmed in Epic.       Delivery Scheduled: Yes, Expected medication delivery date: 09/22/22.     Medication will be delivered via Next Day Courier to the prescription address in Epic WAM.    Max Stewart   [facility] Specialty Technician

## 2022-09-16 ENCOUNTER — Ambulatory Visit
Admit: 2022-09-16 | Discharge: 2022-09-17 | Payer: PRIVATE HEALTH INSURANCE | Attending: Hematology & Oncology | Primary: Hematology & Oncology

## 2022-09-16 ENCOUNTER — Ambulatory Visit: Admit: 2022-09-16 | Discharge: 2022-09-17 | Payer: PRIVATE HEALTH INSURANCE

## 2022-09-16 ENCOUNTER — Institutional Professional Consult (permissible substitution): Admit: 2022-09-16 | Discharge: 2022-09-17 | Payer: PRIVATE HEALTH INSURANCE

## 2022-09-16 DIAGNOSIS — C20 Malignant neoplasm of rectum: Principal | ICD-10-CM

## 2022-09-16 LAB — COMPREHENSIVE METABOLIC PANEL
ALBUMIN: 3.6 g/dL (ref 3.4–5.0)
ALKALINE PHOSPHATASE: 100 U/L (ref 46–116)
ALT (SGPT): 29 U/L (ref 10–49)
ANION GAP: 6 mmol/L (ref 5–14)
AST (SGOT): 24 U/L (ref <=34)
BILIRUBIN TOTAL: 0.7 mg/dL (ref 0.3–1.2)
BLOOD UREA NITROGEN: 23 mg/dL (ref 9–23)
BUN / CREAT RATIO: 23
CALCIUM: 8.7 mg/dL (ref 8.7–10.4)
CHLORIDE: 110 mmol/L — ABNORMAL HIGH (ref 98–107)
CO2: 26.2 mmol/L (ref 20.0–31.0)
CREATININE: 1.01 mg/dL
EGFR CKD-EPI (2021) MALE: 89 mL/min/{1.73_m2} (ref >=60)
GLUCOSE RANDOM: 147 mg/dL (ref 70–179)
POTASSIUM: 4.4 mmol/L (ref 3.4–4.8)
PROTEIN TOTAL: 6.3 g/dL (ref 5.7–8.2)
SODIUM: 142 mmol/L (ref 135–145)

## 2022-09-16 LAB — CBC W/ AUTO DIFF
BASOPHILS ABSOLUTE COUNT: 0 10*9/L (ref 0.0–0.1)
BASOPHILS RELATIVE PERCENT: 0.6 %
EOSINOPHILS ABSOLUTE COUNT: 0.1 10*9/L (ref 0.0–0.5)
EOSINOPHILS RELATIVE PERCENT: 1.5 %
HEMATOCRIT: 41.9 % (ref 39.0–48.0)
HEMOGLOBIN: 14.1 g/dL (ref 12.9–16.5)
LYMPHOCYTES ABSOLUTE COUNT: 1.1 10*9/L (ref 1.1–3.6)
LYMPHOCYTES RELATIVE PERCENT: 25.9 %
MEAN CORPUSCULAR HEMOGLOBIN CONC: 33.8 g/dL (ref 32.0–36.0)
MEAN CORPUSCULAR HEMOGLOBIN: 30 pg (ref 25.9–32.4)
MEAN CORPUSCULAR VOLUME: 88.9 fL (ref 77.6–95.7)
MEAN PLATELET VOLUME: 9.4 fL (ref 6.8–10.7)
MONOCYTES ABSOLUTE COUNT: 0.3 10*9/L (ref 0.3–0.8)
MONOCYTES RELATIVE PERCENT: 7.1 %
NEUTROPHILS ABSOLUTE COUNT: 2.9 10*9/L (ref 1.8–7.8)
NEUTROPHILS RELATIVE PERCENT: 64.9 %
NUCLEATED RED BLOOD CELLS: 0 /100{WBCs} (ref <=4)
PLATELET COUNT: 142 10*9/L — ABNORMAL LOW (ref 150–450)
RED BLOOD CELL COUNT: 4.71 10*12/L (ref 4.26–5.60)
RED CELL DISTRIBUTION WIDTH: 13.2 % (ref 12.2–15.2)
WBC ADJUSTED: 4.4 10*9/L (ref 3.6–11.2)

## 2022-09-16 LAB — CEA: CARCINOEMBRYONIC ANTIGEN: <2 ng/mL (ref 0.0–5.0)

## 2022-09-16 MED ADMIN — heparin, porcine (PF) 100 unit/mL injection 500 Units: 500 [IU] | INTRAVENOUS | @ 14:00:00 | Stop: 2022-09-16

## 2022-09-16 MED ADMIN — trastuzumab-qyyp (TRAZIMERA) 600 mg in sodium chloride (NS) 0.9 % 250 mL IVPB: 6 mg/kg | INTRAVENOUS | @ 15:00:00 | Stop: 2022-09-16

## 2022-09-16 MED ADMIN — heparin, porcine (PF) 100 unit/mL injection 500 Units: 500 [IU] | INTRAVENOUS | @ 15:00:00 | Stop: 2022-09-16

## 2022-09-16 MED ADMIN — sodium chloride (NS) 0.9 % infusion: 100 mL/h | INTRAVENOUS | @ 15:00:00 | Stop: 2022-09-16

## 2022-09-16 NOTE — Unmapped (Signed)
Patient arrived in the infusion clinic at 1015. Weight and vitals were obtained. Port was accessed during nurse visit, RN flushed with blood return, dressing clean, dry and intact. Labs were drawn during provider visit and resulted within treatment plan parameters. No premeds per treatment plan. Patient was administered chemotherapy regimen as ordered without complications while in the clinic. Pt port heparin flushed and deaccessed, covered with 2x2 gauze and bandaid. Patient discharged home to self care.

## 2022-09-16 NOTE — Unmapped (Signed)
Pt states he was stuck in the bathroom this morning.

## 2022-09-21 MED FILL — TUKYSA 50 MG TABLET: ORAL | 30 days supply | Qty: 120 | Fill #2

## 2022-09-21 MED FILL — TUKYSA 150 MG TABLET: ORAL | 30 days supply | Qty: 60 | Fill #2

## 2022-09-29 DIAGNOSIS — C20 Malignant neoplasm of rectum: Principal | ICD-10-CM

## 2022-10-01 NOTE — Unmapped (Signed)
Outgoing call to Patient regarding the rescheduling of appts. No answer left detailed VM

## 2022-10-12 NOTE — Unmapped (Signed)
[facility] Clinical Assessment & Refill Coordination Note    Max Stewart, DOB: 10-06-1970  Phone: [phone] (home) [phone] (work)    All above HIPAA information was verified with patient.     Was a Nurse, learning disability used for this call? No    Specialty Medication(s):   Hematology/Oncology: Max Stewart 150mg  and 50 mg     Current Outpatient Medications   Medication Sig Dispense Refill    insulin ASPART (NOVOLOG) 100 unit/mL injection Inject 1.2-1.4 mL (120-140 Units total) under the skin daily before breakfast.      loperamide (IMODIUM) 2 mg capsule Take 2 capsules (4 mg) by mouth for first loose stool followed by 1 capsule (2 mg) after each loose stool thereafter (maximum of 8 tablets per day). 360 capsule 3    loratadine (CLARITIN) 10 mg tablet Take 1 tablet (10 mg total) by mouth daily as needed for allergies.      losartan (COZAAR) 50 MG tablet Take 1 tablet (50 mg total) by mouth daily before breakfast.      rosuvastatin (CRESTOR) 20 MG tablet Take 1 tablet (20 mg total) by mouth daily.      sildenafiL (VIAGRA) 25 MG tablet Take 1 tablet (25 mg total) by mouth daily as needed for erectile dysfunction. If 1 tablet (25 mg) is ineffective, may take up to maximum of 2 tablets (50 mg) by mouth daily as needed. 60 tablet 5    tucatinib (TUKYSA) 150 mg tablet Take 1 tablet (150 mg total) by mouth two (2) times a day with 1 other tucatinib prescription for 250 mg total. Take with or without a meal. 60 tablet 6    tucatinib (TUKYSA) 50 mg tablet Take 2 tablets (100 mg total) by mouth two (2) times a day with 1 other tucatinib prescription for 250 mg total. Take with or without a meal. 120 tablet 6     No current facility-administered medications for this visit.        Changes to medications: Max Stewart reports no changes at this time.    Allergies   Allergen Reactions    Ciprofloxacin      Liver Disorder       Shellfish Containing Products Hives       Changes to allergies: No    SPECIALTY MEDICATION ADHERENCE     Tukysa 150 mg: ~12 days of medicine on hand   Tukysa 50 mg: ~12 days of medicine on hand     Medication Adherence    Patient reported X missed doses in the last month: 0  Specialty Medication: Tukysa 250 mg twice daily (Tukysa 150 mg and 50 mg)  Patient is on additional specialty medications: No  Informant: patient  Confirmed plan for next specialty medication refill: delivery by pharmacy  Refills needed for supportive medications: not needed          Specialty medication(s) dose(s) confirmed: Regimen is correct and unchanged.     Are there any concerns with adherence? No    Adherence counseling provided? Not needed    CLINICAL MANAGEMENT AND INTERVENTION      Clinical Benefit Assessment:    Do you feel the medicine is effective or helping your condition? Yes    Clinical Benefit counseling provided? Not needed    Adverse Effects Assessment:    Are you experiencing any side effects? No    Are you experiencing difficulty administering your medicine? No    Quality of Life Assessment:    Quality  of Life    Rheumatology  Oncology  1. What impact has your specialty medication had on the reduction of your daily pain or discomfort level?: None  2. On a scale of 1-10, how would you rate your ability to manage side effects associated with your specialty medication? (1=no issues, 10 = unable to take medication due to side effects): 1  Dermatology  Cystic Fibrosis          How many days over the past month did your rectal cancer  keep you from your normal activities? For example, brushing your teeth or getting up in the morning. 0    Have you discussed this with your provider? Not needed    Acute Infection Status:    Acute infections noted within Epic:  No active infections  Patient reported infection: None    Therapy Appropriateness:    Is therapy appropriate based on current medication list, adverse reactions, adherence, clinical benefit and progress toward achieving therapeutic goals? Yes, therapy is appropriate and should be continued     DISEASE/MEDICATION-SPECIFIC INFORMATION      N/A    Oncology: Is the patient receiving adequate infection prevention treatment? Not applicable  Does the patient have adequate nutritional support? Not applicable    PATIENT SPECIFIC NEEDS     Does the patient have any physical, cognitive, or cultural barriers? No    Is the patient high risk? No    Did the patient require a clinical intervention? No    Does the patient require physician intervention or other additional services (i.e., nutrition, smoking cessation, social work)? No    SOCIAL DETERMINANTS OF HEALTH     At the [facility], we have learned that life circumstances - like trouble affording food, housing, utilities, or transportation can affect the health of many of our patients.   That is why we wanted to ask: are you currently experiencing any life circumstances that are negatively impacting your health and/or quality of life? Patient declined to answer    Social Determinants of Health     Financial Resource Strain: Low Risk  (06/30/2021)    Overall Financial Resource Strain (CARDIA)     Difficulty of Paying Living Expenses: Not very hard   Internet Connectivity: No Internet connectivity concern identified (06/30/2021)    Internet Connectivity     Do you have access to internet services: Yes     How do you connect to the internet: Personal Device at home     Is your internet connection strong enough for you to watch video on your device without major problems?: Yes     Do you have enough data to get through the month?: Yes     Does at least one of the devices have a camera that you can use for video chat?: Yes   Food Insecurity: No Food Insecurity (06/30/2021)    Hunger Vital Sign     Worried About Running Out of Food in the Last Year: Never true     Ran Out of Food in the Last Year: Never true   Tobacco Use: Low Risk  (09/16/2022)    Patient History     Smoking Tobacco Use: Never     Smokeless Tobacco Use: Never Passive Exposure: Not on file   Housing/Utilities: Low Risk  (06/30/2021)    Housing/Utilities     Within the past 12 months, have you ever stayed: outside, in a car, in a tent, in an overnight shelter, or temporarily in someone  else's home (i.e. couch-surfing)?: No     Are you worried about losing your housing?: No     Within the past 12 months, have you been unable to get utilities (heat, electricity) when it was really needed?: No   Alcohol Use: Not on file   Transportation Needs: No Transportation Needs (06/30/2021)    PRAPARE - Transportation     Lack of Transportation (Medical): No     Lack of Transportation (Non-Medical): No   Substance Use: Not on file   Health Literacy: Low Risk  (06/30/2021)    Health Literacy     : Never   Physical Activity: Not on file   Interpersonal Safety: Unknown (10/12/2022)    Interpersonal Safety     Unsafe Where You Currently Live: Not on file     Physically Hurt by Anyone: Not on file     Abused by Anyone: Not on file   Stress: Not on file   Intimate Partner Violence: Not on file   Depression: Not at risk (10/02/2018)    Received from [facility]    PHQ-2     (OBSOLETE) Total Score =: 0   Social Connections: Not on file       Would you be willing to receive help with any of the needs that you have identified today? Not applicable       SHIPPING     Specialty Medication(s) to be Shipped:   Hematology/Oncology: Tukysa 150 mg and 50 mg    Other medication(s) to be shipped: No additional medications requested for fill at this time     Changes to insurance: No    Delivery Scheduled: Yes, Expected medication delivery date: 10/21/22.     Medication will be delivered via Next Day Courier to the confirmed prescription address in [facility].    The patient will receive a drug information handout for each medication shipped and additional FDA Medication Guides as required.  Verified that patient has previously received a Conservation officer, historic buildings and a Surveyor, mining.    The patient or caregiver noted above participated in the development of this care plan and knows that they can request review of or adjustments to the care plan at any time.      All of the patient's questions and concerns have been addressed.    Kermit Balo, [facility]   [facility] Specialty Pharmacist

## 2022-10-13 NOTE — Unmapped (Signed)
[facility]     CONSULTING PROVIDERS  Dr. Rayetta Humphrey - Radiation Oncology  Dr. Rico Junker, [facility] Surgical Oncology   Dr. 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 FOLFOX x 8  05/2018 chemoradiotherapy to primary.   -Clinical complete response in liver and rectum, managed with Watch and Wait approach  03/2019 recurrent disease in rectum   05/2019 proctectomy with coloanal anastomosis, liver wedge resection (seg 3): liver with fibrosis, rectum invasive mod to poorly differentiated adenocarcinoma, 3 cm; into muscularis propria, no LVI, tumor at distal margin in area of tearing superceded by additional specimen, 0/16 nodes, two with prominent treatment effect. YpT2N0.  - complicated by abdominal abscess with MRSA  08/02/19 closure of diverting ileostomy  02/2020 2 sites of recurrence disease in liver adjacent to GB fossa  04/14/20 seg 4b wedge resection: two foci of adenocarcinoma c/w colorectal primary. Margin +; node negative   01/2021 SBRT 5000cGy in 50fx to met near GB fossa  06/2021 -09/2021 pelvic + possible right abd wall recurrence FOLFOX x 6  11/05/2021 tucatinib-trastuzumab start [baseline echo nl]  - c1 LFTs 4x ULN. Improved quickly, resumed at tucatinib 250 bid    MOLECULAR PROFILE   TEMPUS from 06/2021 rectal mass biopsy:  HER2 amplified  TP53 and APC mutation  RAS/RAF WT  TMB 6.3, MSS    Treatment plan:  tucatinib + trastuzumab.      ___________________________________________________________________    ASSESSMENT  1. 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 Stable to minor progression. Indeterminate omental nodule-- previously determined to be fat necrosis   2. Type I DM- controlled on insulin pump, no complications from DM  3. Elevated transaminases, improved from prior, CTM  4. ECOG 0    RECOMMENDATIONS  1. Tucatinib 250 mg BID + trastuzumab   2. RTC in 3 weeks for treatment, 6 weeks scans, labs, visit     DISCUSSION  Max Stewart is doing well on treatment, feels tolerating well and managing side effects.  Continue same treatment plan.     Medical decision making based high complexity of cancer and treatment risk of complications and monitoring and management of toxicity of treatment.     ______________________________________________________________    Interval History  Max Stewart returns today for ongoing management of his rectal cancer with liver mets.    Max Stewart comes in today doing well.  Taking his tucatinib, not missing doses.  No new concerns.  Energy stable.  Eating well.  No issues with nausea.  Denies abd pain.  Same bowel issues, feels he is managing well.      MEDICAL HISTORY  1. Type I DM- diagnosed at age 1, on insulin     SOCIAL HISTORY   Denies smoking and drugs. Drinks occasional alcohol. 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 He lives with his wife and two daughters     PHYSICAL EXAM  Vitals:    10/14/22 1344   BP: 135/82   Pulse: 57   Resp: 16   Temp: 36.5 ??C (97.7 ??F)   SpO2: 96%       GENERAL: well developed, well nourished, in no distress  PSYCH: full and appropriate range of affect with good insight and judgement  HEENT: NCAT, pupils equal, sclerae anicteric  SKIN: No  rashes  Abd soft, cannot feel the abd wall mass    OBJECTIVE DATA  Reviewed in Epic , CBC, CMP reviewed, look good  LFTs nl    RADIOLOGY RESULTS   None today

## 2022-10-14 ENCOUNTER — Institutional Professional Consult (permissible substitution): Admit: 2022-10-14 | Discharge: 2022-10-14 | Payer: PRIVATE HEALTH INSURANCE

## 2022-10-14 ENCOUNTER — Ambulatory Visit: Admit: 2022-10-14 | Discharge: 2022-10-14 | Payer: PRIVATE HEALTH INSURANCE

## 2022-10-14 DIAGNOSIS — E109 Type 1 diabetes mellitus without complications: Principal | ICD-10-CM

## 2022-10-14 DIAGNOSIS — C20 Malignant neoplasm of rectum: Principal | ICD-10-CM

## 2022-10-14 DIAGNOSIS — C7989 Secondary malignant neoplasm of other specified sites: Principal | ICD-10-CM

## 2022-10-14 DIAGNOSIS — Z79899 Other long term (current) drug therapy: Principal | ICD-10-CM

## 2022-10-14 LAB — CBC W/ AUTO DIFF
BASOPHILS ABSOLUTE COUNT: 0 10*9/L (ref 0.0–0.1)
BASOPHILS RELATIVE PERCENT: 0.7 %
EOSINOPHILS ABSOLUTE COUNT: 0.1 10*9/L (ref 0.0–0.5)
EOSINOPHILS RELATIVE PERCENT: 2.3 %
HEMATOCRIT: 40.8 % (ref 39.0–48.0)
HEMOGLOBIN: 13.9 g/dL (ref 12.9–16.5)
LYMPHOCYTES ABSOLUTE COUNT: 1.4 10*9/L (ref 1.1–3.6)
LYMPHOCYTES RELATIVE PERCENT: 30.3 %
MEAN CORPUSCULAR HEMOGLOBIN CONC: 34 g/dL (ref 32.0–36.0)
MEAN CORPUSCULAR HEMOGLOBIN: 30.4 pg (ref 25.9–32.4)
MEAN CORPUSCULAR VOLUME: 89.5 fL (ref 77.6–95.7)
MEAN PLATELET VOLUME: 10.1 fL (ref 6.8–10.7)
MONOCYTES ABSOLUTE COUNT: 0.3 10*9/L (ref 0.3–0.8)
MONOCYTES RELATIVE PERCENT: 6.2 %
NEUTROPHILS ABSOLUTE COUNT: 2.8 10*9/L (ref 1.8–7.8)
NEUTROPHILS RELATIVE PERCENT: 60.5 %
NUCLEATED RED BLOOD CELLS: 0 /100{WBCs} (ref <=4)
PLATELET COUNT: 143 10*9/L — ABNORMAL LOW (ref 150–450)
RED BLOOD CELL COUNT: 4.55 10*12/L (ref 4.26–5.60)
RED CELL DISTRIBUTION WIDTH: 13.5 % (ref 12.2–15.2)
WBC ADJUSTED: 4.6 10*9/L (ref 3.6–11.2)

## 2022-10-14 LAB — COMPREHENSIVE METABOLIC PANEL
ALBUMIN: 3.5 g/dL (ref 3.4–5.0)
ALKALINE PHOSPHATASE: 121 U/L — ABNORMAL HIGH (ref 46–116)
ALT (SGPT): 79 U/L — ABNORMAL HIGH (ref 10–49)
ANION GAP: 6 mmol/L (ref 5–14)
AST (SGOT): 31 U/L (ref <=34)
BILIRUBIN TOTAL: 0.7 mg/dL (ref 0.3–1.2)
BLOOD UREA NITROGEN: 25 mg/dL — ABNORMAL HIGH (ref 9–23)
BUN / CREAT RATIO: 25
CALCIUM: 8.8 mg/dL (ref 8.7–10.4)
CHLORIDE: 107 mmol/L (ref 98–107)
CO2: 27.5 mmol/L (ref 20.0–31.0)
CREATININE: 1 mg/dL
EGFR CKD-EPI (2021) MALE: 90 mL/min/{1.73_m2} (ref >=60)
GLUCOSE RANDOM: 223 mg/dL — ABNORMAL HIGH (ref 70–179)
POTASSIUM: 4.4 mmol/L (ref 3.4–4.8)
PROTEIN TOTAL: 6.3 g/dL (ref 5.7–8.2)
SODIUM: 140 mmol/L (ref 135–145)

## 2022-10-14 LAB — CEA: CARCINOEMBRYONIC ANTIGEN: <2 ng/mL (ref 0.0–5.0)

## 2022-10-14 MED ADMIN — trastuzumab-qyyp (TRAZIMERA) 600 mg in sodium chloride (NS) 0.9 % 250 mL IVPB: 6 mg/kg | INTRAVENOUS | @ 19:00:00 | Stop: 2022-10-14

## 2022-10-14 MED ADMIN — heparin, porcine (PF) 100 unit/mL injection 500 Units: 500 [IU] | INTRAVENOUS | @ 20:00:00 | Stop: 2022-10-14

## 2022-10-14 MED ADMIN — sodium chloride (NS) 0.9 % infusion: 100 mL/h | INTRAVENOUS | @ 19:00:00 | Stop: 2022-10-14

## 2022-10-14 MED ADMIN — heparin, porcine (PF) 100 unit/mL injection 500 Units: 500 [IU] | INTRAVENOUS | @ 18:00:00 | Stop: 2022-10-14

## 2022-10-14 NOTE — Unmapped (Signed)
Patient arrived in the infusion clinic at 1425. Weight and vitals were obtained. Port was accessed during nurse visit, RN flushed with blood return, dressing clean, dry and intact. Labs were drawn during provider visit and resulted within treatment plan parameters. Patient was administered chemotherapy regimen as ordered without complications while in the clinic. Pt port heparin flushed and deaccessed, covered with 2x2 gauze and bandaid. Patient \\discharged  home to self care.

## 2022-10-20 MED FILL — TUKYSA 150 MG TABLET: ORAL | 30 days supply | Qty: 60 | Fill #3

## 2022-10-20 MED FILL — TUKYSA 50 MG TABLET: ORAL | 30 days supply | Qty: 120 | Fill #3

## 2022-11-04 ENCOUNTER — Institutional Professional Consult (permissible substitution): Admit: 2022-11-04 | Discharge: 2022-11-04 | Payer: PRIVATE HEALTH INSURANCE

## 2022-11-04 ENCOUNTER — Ambulatory Visit: Admit: 2022-11-04 | Discharge: 2022-11-04 | Payer: PRIVATE HEALTH INSURANCE

## 2022-11-04 DIAGNOSIS — C20 Malignant neoplasm of rectum: Principal | ICD-10-CM

## 2022-11-04 LAB — COMPREHENSIVE METABOLIC PANEL
ALBUMIN: 3.7 g/dL (ref 3.4–5.0)
ALKALINE PHOSPHATASE: 91 U/L (ref 46–116)
ALT (SGPT): 33 U/L (ref 10–49)
ANION GAP: 6 mmol/L (ref 5–14)
AST (SGOT): 31 U/L (ref <=34)
BILIRUBIN TOTAL: 0.5 mg/dL (ref 0.3–1.2)
BLOOD UREA NITROGEN: 23 mg/dL (ref 9–23)
BUN / CREAT RATIO: 21
CALCIUM: 8.5 mg/dL — ABNORMAL LOW (ref 8.7–10.4)
CHLORIDE: 109 mmol/L — ABNORMAL HIGH (ref 98–107)
CO2: 27.2 mmol/L (ref 20.0–31.0)
CREATININE: 1.11 mg/dL
EGFR CKD-EPI (2021) MALE: 79 mL/min/{1.73_m2} (ref >=60)
GLUCOSE RANDOM: 127 mg/dL (ref 70–179)
POTASSIUM: 4.4 mmol/L (ref 3.4–4.8)
PROTEIN TOTAL: 6.2 g/dL (ref 5.7–8.2)
SODIUM: 142 mmol/L (ref 135–145)

## 2022-11-04 LAB — CBC W/ AUTO DIFF
BASOPHILS ABSOLUTE COUNT: 0 10*9/L (ref 0.0–0.1)
BASOPHILS RELATIVE PERCENT: 0.6 %
EOSINOPHILS ABSOLUTE COUNT: 0.1 10*9/L (ref 0.0–0.5)
EOSINOPHILS RELATIVE PERCENT: 2.4 %
HEMATOCRIT: 41.4 % (ref 39.0–48.0)
HEMOGLOBIN: 14 g/dL (ref 12.9–16.5)
LYMPHOCYTES ABSOLUTE COUNT: 1.3 10*9/L (ref 1.1–3.6)
LYMPHOCYTES RELATIVE PERCENT: 29.6 %
MEAN CORPUSCULAR HEMOGLOBIN CONC: 33.8 g/dL (ref 32.0–36.0)
MEAN CORPUSCULAR HEMOGLOBIN: 30 pg (ref 25.9–32.4)
MEAN CORPUSCULAR VOLUME: 88.8 fL (ref 77.6–95.7)
MEAN PLATELET VOLUME: 9.7 fL (ref 6.8–10.7)
MONOCYTES ABSOLUTE COUNT: 0.3 10*9/L (ref 0.3–0.8)
MONOCYTES RELATIVE PERCENT: 7.6 %
NEUTROPHILS ABSOLUTE COUNT: 2.6 10*9/L (ref 1.8–7.8)
NEUTROPHILS RELATIVE PERCENT: 59.8 %
NUCLEATED RED BLOOD CELLS: 0 /100{WBCs} (ref <=4)
PLATELET COUNT: 135 10*9/L — ABNORMAL LOW (ref 150–450)
RED BLOOD CELL COUNT: 4.66 10*12/L (ref 4.26–5.60)
RED CELL DISTRIBUTION WIDTH: 13.5 % (ref 12.2–15.2)
WBC ADJUSTED: 4.3 10*9/L (ref 3.6–11.2)

## 2022-11-04 LAB — CEA: CARCINOEMBRYONIC ANTIGEN: <2 ng/mL (ref 0.0–5.0)

## 2022-11-04 MED ADMIN — heparin, porcine (PF) 100 unit/mL injection 500 Units: 500 [IU] | INTRAVENOUS | @ 14:00:00 | Stop: 2022-11-04

## 2022-11-04 MED ADMIN — sodium chloride (NS) 0.9 % infusion: 100 mL/h | INTRAVENOUS | @ 14:00:00 | Stop: 2022-11-04

## 2022-11-04 MED ADMIN — trastuzumab-qyyp (TRAZIMERA) 600 mg in sodium chloride (NS) 0.9 % 250 mL IVPB: 6 mg/kg | INTRAVENOUS | @ 14:00:00 | Stop: 2022-11-04

## 2022-11-04 NOTE — Unmapped (Signed)
Patient arrived in the infusion clinic at 0850.  Weight and Vitals were obtained. Prior to infusion visit the patient had a nurse visit where their port was accessed, flushed with blood return, dressing clean, dry, and intact.  Labs were drawn and resulted. No premedications were ordered per treatment plan. Patient received Trastuzumab chemotherapy regiment as ordered without complications while in the clinic.  Port was flushed with blood return, heparin locked, then de-accessed area covered with 2x2 gauze. Patient then discharged home to self care.

## 2022-11-04 NOTE — Unmapped (Signed)
Clinical Support on 11/04/2022   Component Date Value Ref Range Status    Sodium 11/04/2022 142  135 - 145 mmol/L Final    Potassium 11/04/2022 4.4  3.4 - 4.8 mmol/L Final    Chloride 11/04/2022 109 (H)  98 - 107 mmol/L Final    CO2 11/04/2022 27.2  20.0 - 31.0 mmol/L Final    Anion Gap 11/04/2022 6  5 - 14 mmol/L Final    BUN 11/04/2022 23  9 - 23 mg/dL Final    Creatinine 81/19/1478 1.11  0.73 - 1.18 mg/dL Final    BUN/Creatinine Ratio 11/04/2022 21   Final    eGFR CKD-EPI (2021) Male 11/04/2022 79  >=60 mL/min/1.73m2 Final    eGFR calculated with CKD-EPI 2021 equation in accordance with [employer] and [employer] of Nephrology Task Force recommendations.    Glucose 11/04/2022 127  70 - 179 mg/dL Final    Calcium 29/56/2130 8.5 (L)  8.7 - 10.4 mg/dL Final    Albumin 86/57/8469 3.7  3.4 - 5.0 g/dL Final    Total Protein 11/04/2022 6.2  5.7 - 8.2 g/dL Final    Total Bilirubin 11/04/2022 0.5  0.3 - 1.2 mg/dL Final    AST 62/95/2841 31  <=34 U/L Final    ALT 11/04/2022 33  10 - 49 U/L Final    Alkaline Phosphatase 11/04/2022 91  46 - 116 U/L Final    WBC 11/04/2022 4.3  3.6 - 11.2 10*9/L Final    RBC 11/04/2022 4.66  4.26 - 5.60 10*12/L Final    HGB 11/04/2022 14.0  12.9 - 16.5 g/dL Final    HCT 32/44/0102 41.4  39.0 - 48.0 % Final    MCV 11/04/2022 88.8  77.6 - 95.7 fL Final    MCH 11/04/2022 30.0  25.9 - 32.4 pg Final    MCHC 11/04/2022 33.8  32.0 - 36.0 g/dL Final    RDW 72/53/6644 13.5  12.2 - 15.2 % Final    MPV 11/04/2022 9.7  6.8 - 10.7 fL Final    Platelet 11/04/2022 135 (L)  150 - 450 10*9/L Final    nRBC 11/04/2022 0  <=4 /100 WBCs Final    Neutrophils % 11/04/2022 59.8  % Final    Lymphocytes % 11/04/2022 29.6  % Final    Monocytes % 11/04/2022 7.6  % Final    Eosinophils % 11/04/2022 2.4  % Final    Basophils % 11/04/2022 0.6  % Final    Absolute Neutrophils 11/04/2022 2.6  1.8 - 7.8 10*9/L Final    Absolute Lymphocytes 11/04/2022 1.3  1.1 - 3.6 10*9/L Final    Absolute Monocytes 11/04/2022 0.3  0.3 - 0.8 10*9/L Final    Absolute Eosinophils 11/04/2022 0.1  0.0 - 0.5 10*9/L Final    Absolute Basophils 11/04/2022 0.0  0.0 - 0.1 10*9/L Final

## 2022-11-16 NOTE — Unmapped (Signed)
[facility] Clinical Assessment & Refill Coordination Note    Max Stewart, DOB: March 02, 1970  Phone: [phone] (home) [phone] (work)    All above HIPAA information was verified with patient.     Was a Nurse, learning disability used for this call? No    Specialty Medication(s):   Hematology/Oncology: Tukysa 150 mg and 50 mg     Current Outpatient Medications   Medication Sig Dispense Refill    insulin ASPART (NOVOLOG) 100 unit/mL injection Inject 1.2-1.4 mL (120-140 Units total) under the skin daily before breakfast.      loperamide (IMODIUM) 2 mg capsule Take 2 capsules (4 mg) by mouth for first loose stool followed by 1 capsule (2 mg) after each loose stool thereafter (maximum of 8 tablets per day). 360 capsule 3    loratadine (CLARITIN) 10 mg tablet Take 1 tablet (10 mg total) by mouth daily as needed for allergies.      losartan (COZAAR) 50 MG tablet Take 1 tablet (50 mg total) by mouth daily before breakfast.      rosuvastatin (CRESTOR) 20 MG tablet Take 1 tablet (20 mg total) by mouth daily.      sildenafiL (VIAGRA) 25 MG tablet Take 1 tablet (25 mg total) by mouth daily as needed for erectile dysfunction. If 1 tablet (25 mg) is ineffective, may take up to maximum of 2 tablets (50 mg) by mouth daily as needed. 60 tablet 5    tucatinib (TUKYSA) 150 mg tablet Take 1 tablet (150 mg total) by mouth two (2) times a day with 1 other tucatinib prescription for 250 mg total. Take with or without a meal. 60 tablet 6    tucatinib (TUKYSA) 50 mg tablet Take 2 tablets (100 mg total) by mouth two (2) times a day with 1 other tucatinib prescription for 250 mg total. Take with or without a meal. 120 tablet 6     No current facility-administered medications for this visit.        Changes to medications: Max Stewart reports no changes at this time.    Allergies   Allergen Reactions    Ciprofloxacin      Liver Disorder       Shellfish Containing Products Hives       Changes to allergies: No    SPECIALTY MEDICATION ADHERENCE     Tukysa 150 mg: 14 days of medicine on hand   Tukysa 50 mg: 14 days of medicine on hand     Medication Adherence    Patient reported X missed doses in the last month: 0  Specialty Medication: Tukysa 150 mg and 50 mg tablets - 250 mg twice daily  Patient is on additional specialty medications: No  Informant: patient  Confirmed plan for next specialty medication refill: delivery by pharmacy  Refills needed for supportive medications: not needed          Specialty medication(s) dose(s) confirmed: Regimen is correct and unchanged.     Are there any concerns with adherence? No    Adherence counseling provided? Not needed    CLINICAL MANAGEMENT AND INTERVENTION      Clinical Benefit Assessment:    Do you feel the medicine is effective or helping your condition? Yes    Clinical Benefit counseling provided? Not needed    Adverse Effects Assessment:    Are you experiencing any side effects? No    Are you experiencing difficulty administering your medicine? No    Quality of Life Assessment:  Quality of Life    Rheumatology  Oncology  1. What impact has your specialty medication had on the reduction of your daily pain or discomfort level?: None  2. On a scale of 1-10, how would you rate your ability to manage side effects associated with your specialty medication? (1=no issues, 10 = unable to take medication due to side effects): 1  Dermatology  Cystic Fibrosis          How many days over the past month did your rectal adenocarcinoma  keep you from your normal activities? For example, brushing your teeth or getting up in the morning. 0    Have you discussed this with your provider? Not needed    Acute Infection Status:    Acute infections noted within Epic:  No active infections  Patient reported infection: None    Therapy Appropriateness:    Is therapy appropriate based on current medication list, adverse reactions, adherence, clinical benefit and progress toward achieving therapeutic goals? Yes, therapy is appropriate and should be continued     DISEASE/MEDICATION-SPECIFIC INFORMATION      N/A    Oncology: Is the patient receiving adequate infection prevention treatment? Not applicable  Does the patient have adequate nutritional support? Not applicable    PATIENT SPECIFIC NEEDS     Does the patient have any physical, cognitive, or cultural barriers? No    Is the patient high risk? No    Did the patient require a clinical intervention? No    Does the patient require physician intervention or other additional services (i.e., nutrition, smoking cessation, social work)? No    SOCIAL DETERMINANTS OF HEALTH     At the [facility], we have learned that life circumstances - like trouble affording food, housing, utilities, or transportation can affect the health of many of our patients.   That is why we wanted to ask: are you currently experiencing any life circumstances that are negatively impacting your health and/or quality of life? Patient declined to answer    Social Determinants of Health     Food Insecurity: No Food Insecurity (06/30/2021)    Hunger Vital Sign     Worried About Running Out of Food in the Last Year: Never true     Ran Out of Food in the Last Year: Never true   Internet Connectivity: No Internet connectivity concern identified (06/30/2021)    Internet Connectivity     Do you have access to internet services: Yes     How do you connect to the internet: Personal Device at home     Is your internet connection strong enough for you to watch video on your device without major problems?: Yes     Do you have enough data to get through the month?: Yes     Does at least one of the devices have a camera that you can use for video chat?: Yes   Housing/Utilities: Low Risk  (06/30/2021)    Housing/Utilities     Within the past 12 months, have you ever stayed: outside, in a car, in a tent, in an overnight shelter, or temporarily in someone else's home (i.e. couch-surfing)?: No     Are you worried about losing your housing?: No     Within the past 12 months, have you been unable to get utilities (heat, electricity) when it was really needed?: No   Tobacco Use: Low Risk  (10/18/2022)    Received from [facility]    Patient  History     Smoking Tobacco Use: Never     Smokeless Tobacco Use: Never     Passive Exposure: Not on file   Transportation Needs: No Transportation Needs (06/30/2021)    PRAPARE - Transportation     Lack of Transportation (Medical): No     Lack of Transportation (Non-Medical): No   Alcohol Use: Not on file   Interpersonal Safety: Unknown (11/16/2022)    Interpersonal Safety     Unsafe Where You Currently Live: Not on file     Physically Hurt by Anyone: Not on file     Abused by Anyone: Not on file   Physical Activity: Not on file   Intimate Partner Violence: Not on file   Stress: Not on file   Substance Use: Not on file   Social Connections: Not on file   Financial Resource Strain: Low Risk  (06/30/2021)    Overall Financial Resource Strain (CARDIA)     Difficulty of Paying Living Expenses: Not very hard   Depression: Not at risk (10/02/2018)    Received from [facility]    PHQ-2     (OBSOLETE) Total Score =: 0   Health Literacy: Low Risk  (06/30/2021)    Health Literacy     : Never       Would you be willing to receive help with any of the needs that you have identified today? Not applicable       SHIPPING     Specialty Medication(s) to be Shipped:   Hematology/Oncology: Tukysa 150 mg and 50 mg    Other medication(s) to be shipped: No additional medications requested for fill at this time     Changes to insurance: No    Delivery Scheduled: Yes, Expected medication delivery date: 11/26/22.     Medication will be delivered via Next Day Courier to the confirmed prescription address in [facility].    The patient will receive a drug information handout for each medication shipped and additional FDA Medication Guides as required.  Verified that patient has previously received a Conservation officer, historic buildings and a Surveyor, mining.    The patient or caregiver noted above participated in the development of this care plan and knows that they can request review of or adjustments to the care plan at any time.      All of the patient's questions and concerns have been addressed.    Kermit Balo, [facility]   [facility] Specialty Pharmacist

## 2022-11-24 ENCOUNTER — Ambulatory Visit: Admit: 2022-11-24 | Discharge: 2022-11-25 | Payer: PRIVATE HEALTH INSURANCE

## 2022-11-24 MED ADMIN — gadopiclenol injection 10 mL: 10 mL | INTRAVENOUS | @ 19:00:00 | Stop: 2022-11-24

## 2022-11-24 NOTE — Unmapped (Signed)
[facility]     CONSULTING PROVIDERS  Dr. Rayetta Humphrey - Radiation Oncology  Dr. Rico Junker, [facility] Surgical On[facility]   Dr. 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 FOLFOX x 8  05/2018 chemoradiotherapy to primary.   -Clinical complete response in liver and rectum, managed with Watch and Wait approach  03/2019 recurrent disease in rectum   05/2019 proctectomy with coloanal anastomosis, liver wedge resection (seg 3): liver with fibrosis, rectum invasive mod to poorly differentiated adenocarcinoma, 3 cm; into muscularis propria, no LVI, tumor at distal margin in area of tearing superceded by additional specimen, 0/16 nodes, two with prominent treatment effect. YpT2N0.  - complicated by abdominal abscess with MRSA  08/02/19 closure of diverting ileostomy  02/2020 2 sites of recurrence disease in liver adjacent to GB fossa  04/14/20 seg 4b wedge resection: two foci of adenocarcinoma c/w colorectal primary. Margin +; node negative   01/2021 SBRT 5000cGy in 84fx to met near GB fossa  06/2021 -09/2021 pelvic + possible right abd wall recurrence FOLFOX x 6  11/05/2021 tucatinib-trastuzumab start [baseline echo nl]  - c1 LFTs 4x ULN. Improved quickly, resumed at tucatinib 250 bid    MOLECULAR PROFILE   TEMPUS from 06/2021 rectal mass biopsy:  HER2 amplified  TP53 and APC mutation  RAS/RAF WT  TMB 6.3, MSS    Treatment plan:  tucatinib + trastuzumab.      ___________________________________________________________________    ASSESSMENT  1. 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 Stable    2. Type I DM- controlled on insulin pump, no complications from DM  3. Elevated transaminases, improved from prior, CTM  4. ECOG 0    RECOMMENDATIONS  1. Tucatinib 250 mg BID + trastuzumab   2. RTC in 3 weeks for treatment, bump out next provider visit to scans given stability    DISCUSSION   Doing well with good cancer control and good labs. He feels QOL on this regimen is fine. Not interested in surgery     Medical decision making based high complexity of cancer and treatment risk of complications and monitoring and management of toxicity of treatment.     ______________________________________________________________    Interval History  [name] returns today for ongoing management of his rectal cancer with liver mets.    Max Stewart is feeling fine- same up and downs with bowels.  Energy is ok. No new pains. Eating fine.        MEDICAL HISTORY  1. Type I DM- diagnosed at age 52, on insulin     SOCIAL HISTORY   Denies smoking and drugs. Drinks occasional alcohol. 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  deferred    OBJECTIVE DATA  Reviewed in Epic , CBC, CMP reviewed, look good  LFTs nl    RADIOLOGY RESULTS   Scans reviewed and show stable disease

## 2022-11-25 ENCOUNTER — Ambulatory Visit
Admit: 2022-11-25 | Discharge: 2022-11-25 | Payer: PRIVATE HEALTH INSURANCE | Attending: Hematology & Oncology | Primary: Hematology & Oncology

## 2022-11-25 ENCOUNTER — Institutional Professional Consult (permissible substitution): Admit: 2022-11-25 | Discharge: 2022-11-25 | Payer: PRIVATE HEALTH INSURANCE

## 2022-11-25 ENCOUNTER — Ambulatory Visit: Admit: 2022-11-25 | Discharge: 2022-11-25 | Payer: PRIVATE HEALTH INSURANCE

## 2022-11-25 DIAGNOSIS — C20 Malignant neoplasm of rectum: Principal | ICD-10-CM

## 2022-11-25 DIAGNOSIS — C7989 Secondary malignant neoplasm of other specified sites: Principal | ICD-10-CM

## 2022-11-25 LAB — COMPREHENSIVE METABOLIC PANEL
ALBUMIN: 3.5 g/dL (ref 3.4–5.0)
ALKALINE PHOSPHATASE: 98 U/L (ref 46–116)
ALT (SGPT): 27 U/L (ref 10–49)
ANION GAP: 5 mmol/L (ref 5–14)
AST (SGOT): 24 U/L (ref <=34)
BILIRUBIN TOTAL: 0.4 mg/dL (ref 0.3–1.2)
BLOOD UREA NITROGEN: 22 mg/dL (ref 9–23)
BUN / CREAT RATIO: 23
CALCIUM: 8.8 mg/dL (ref 8.7–10.4)
CHLORIDE: 108 mmol/L — ABNORMAL HIGH (ref 98–107)
CO2: 28.4 mmol/L (ref 20.0–31.0)
CREATININE: 0.95 mg/dL
EGFR CKD-EPI (2021) MALE: >90 mL/min/{1.73_m2} (ref >=60)
GLUCOSE RANDOM: 175 mg/dL (ref 70–179)
POTASSIUM: 4.1 mmol/L (ref 3.4–4.8)
PROTEIN TOTAL: 6.3 g/dL (ref 5.7–8.2)
SODIUM: 141 mmol/L (ref 135–145)

## 2022-11-25 LAB — CBC W/ AUTO DIFF
BASOPHILS ABSOLUTE COUNT: 0 10*9/L (ref 0.0–0.1)
BASOPHILS RELATIVE PERCENT: 0.6 %
EOSINOPHILS ABSOLUTE COUNT: 0.1 10*9/L (ref 0.0–0.5)
EOSINOPHILS RELATIVE PERCENT: 2 %
HEMATOCRIT: 42.1 % (ref 39.0–48.0)
HEMOGLOBIN: 14.2 g/dL (ref 12.9–16.5)
LYMPHOCYTES ABSOLUTE COUNT: 1.4 10*9/L (ref 1.1–3.6)
LYMPHOCYTES RELATIVE PERCENT: 30.3 %
MEAN CORPUSCULAR HEMOGLOBIN CONC: 33.8 g/dL (ref 32.0–36.0)
MEAN CORPUSCULAR HEMOGLOBIN: 29.8 pg (ref 25.9–32.4)
MEAN CORPUSCULAR VOLUME: 88.1 fL (ref 77.6–95.7)
MEAN PLATELET VOLUME: 9.6 fL (ref 6.8–10.7)
MONOCYTES ABSOLUTE COUNT: 0.3 10*9/L (ref 0.3–0.8)
MONOCYTES RELATIVE PERCENT: 7.4 %
NEUTROPHILS ABSOLUTE COUNT: 2.7 10*9/L (ref 1.8–7.8)
NEUTROPHILS RELATIVE PERCENT: 59.7 %
NUCLEATED RED BLOOD CELLS: 0 /100{WBCs} (ref <=4)
PLATELET COUNT: 153 10*9/L (ref 150–450)
RED BLOOD CELL COUNT: 4.78 10*12/L (ref 4.26–5.60)
RED CELL DISTRIBUTION WIDTH: 13.4 % (ref 12.2–15.2)
WBC ADJUSTED: 4.6 10*9/L (ref 3.6–11.2)

## 2022-11-25 LAB — CEA: CARCINOEMBRYONIC ANTIGEN: <2 ng/mL (ref 0.0–5.0)

## 2022-11-25 MED ADMIN — heparin, porcine (PF) 100 unit/mL injection 500 Units: 500 [IU] | INTRAVENOUS | @ 16:00:00 | Stop: 2022-11-25

## 2022-11-25 MED ADMIN — heparin, porcine (PF) 100 unit/mL injection 500 Units: 500 [IU] | INTRAVENOUS | @ 14:00:00 | Stop: 2022-11-25

## 2022-11-25 MED ADMIN — trastuzumab-qyyp (TRAZIMERA) 600 mg in sodium chloride (NS) 0.9 % 250 mL IVPB: 6 mg/kg | INTRAVENOUS | @ 16:00:00 | Stop: 2022-11-25

## 2022-11-25 MED FILL — TUKYSA 150 MG TABLET: ORAL | 30 days supply | Qty: 60 | Fill #4

## 2022-11-25 MED FILL — TUKYSA 50 MG TABLET: ORAL | 30 days supply | Qty: 120 | Fill #4

## 2022-11-25 NOTE — Unmapped (Signed)
Patient arrived in the infusion clinic at 1035.  Weight and Vitals were obtained. Prior to infusion visit the patient had a nurse visit where their port was accessed, flushed with blood return, dressing clean, dry, and intact.  Labs were drawn and resulted within treatment plan parameters.  No premedications were ordered per treatment plan.  Patient received Trastuzumab chemotherapy regiment as ordered without complications while in the clinic.  Port was flushed with blood return, heparin locked, then de-accessed area covered with 2x2 gauze and ban-aid.  Patient then discharged home to self care.

## 2022-12-15 ENCOUNTER — Ambulatory Visit (INDEPENDENT_AMBULATORY_CARE_PROVIDER_SITE_OTHER): Payer: BC Managed Care – PPO | Admitting: Podiatry

## 2022-12-15 ENCOUNTER — Encounter: Payer: Self-pay | Admitting: Podiatry

## 2022-12-15 VITALS — Ht 72.0 in | Wt 231.3 lb

## 2022-12-15 DIAGNOSIS — L600 Ingrowing nail: Secondary | ICD-10-CM | POA: Diagnosis not present

## 2022-12-15 NOTE — Progress Notes (Signed)
  Subjective:  Patient ID: Nicholas Marsh, male    DOB: [birth year],  MRN: [id number]  Chief Complaint  Patient presents with   Ingrown Toenail    Pt has a ingrown on greater right toe for 10 weeks very painful, pt seen another doctor for the issue was infected but put on antibiotics for that.    52 y.o. male presents with the above complaint. History confirmed with patient.   Objective:  Physical Exam: warm, good capillary refill, no trophic changes or ulcerative lesions, normal DP and PT pulses, normal sensory exam, and left great toe medial border ingrown with paronychia.  Assessment:   1. Ingrowing left great toenail      Plan:  Patient was evaluated and treated and all questions answered.    Ingrown Nail, left -Patient elects to proceed with minor surgery to remove ingrown toenail today. Consent reviewed and signed by patient. -Ingrown nail excised. See procedure note. -Educated on post-procedure care including soaking. Written instructions provided and reviewed.   Procedure: Excision of Ingrown Toenail Location: Left 1st toe medial nail borders. Anesthesia: Lidocaine 1% plain; 1.5 mL and Marcaine 0.5% plain; 1.5 mL, digital block. Skin Prep: Betadine. Dressing: Silvadene; telfa; dry, sterile, compression dressing. Technique: Following skin prep, the toe was exsanguinated and a tourniquet was secured at the base of the toe. The affected nail border was freed, split with a nail splitter, and excised. The tourniquet was then removed and sterile dressing applied. Disposition: Patient tolerated procedure well.    Return if symptoms worsen or fail to improve.

## 2022-12-15 NOTE — Patient Instructions (Signed)
Soak Instructions ° ° ° °THE DAY AFTER THE PROCEDURE ° °Place 1/4 cup of epsom salts (or betadine, or white vinegar) in a quart of warm tap water.  Submerge your foot or feet with outer bandage intact for the initial soak; this will allow the bandage to become moist and wet for easy lift off.  Once you remove your bandage, continue to soak in the solution for 20 minutes.  This soak should be done twice a day.  Next, remove your foot or feet from solution, blot dry the affected area and cover.  You may use a band aid large enough to cover the area or use gauze and tape.  Apply other medications to the area as directed by the doctor such as polysporin neosporin. ° °IF YOUR SKIN BECOMES IRRITATED WHILE USING THESE INSTRUCTIONS, IT IS OKAY TO SWITCH TO  WHITE VINEGAR AND WATER. Or you may use antibacterial soap and water to keep the toe clean ° °Monitor for any signs/symptoms of infection. Call the office immediately if any occur or go directly to the emergency room. Call with any questions/concerns. ° ° °

## 2022-12-16 ENCOUNTER — Institutional Professional Consult (permissible substitution): Admit: 2022-12-16 | Discharge: 2022-12-17 | Payer: PRIVATE HEALTH INSURANCE

## 2022-12-16 ENCOUNTER — Ambulatory Visit: Admit: 2022-12-16 | Discharge: 2022-12-17 | Payer: PRIVATE HEALTH INSURANCE

## 2022-12-16 DIAGNOSIS — C20 Malignant neoplasm of rectum: Principal | ICD-10-CM

## 2022-12-16 LAB — CBC W/ AUTO DIFF
BASOPHILS ABSOLUTE COUNT: 0 10*9/L (ref 0.0–0.1)
BASOPHILS RELATIVE PERCENT: 0.7 %
EOSINOPHILS ABSOLUTE COUNT: 0.1 10*9/L (ref 0.0–0.5)
EOSINOPHILS RELATIVE PERCENT: 1.1 %
HEMATOCRIT: 41.4 % (ref 39.0–48.0)
HEMOGLOBIN: 14 g/dL (ref 12.9–16.5)
LYMPHOCYTES ABSOLUTE COUNT: 1.6 10*9/L (ref 1.1–3.6)
LYMPHOCYTES RELATIVE PERCENT: 27.2 %
MEAN CORPUSCULAR HEMOGLOBIN CONC: 33.9 g/dL (ref 32.0–36.0)
MEAN CORPUSCULAR HEMOGLOBIN: 30 pg (ref 25.9–32.4)
MEAN CORPUSCULAR VOLUME: 88.5 fL (ref 77.6–95.7)
MEAN PLATELET VOLUME: 9.3 fL (ref 6.8–10.7)
MONOCYTES ABSOLUTE COUNT: 0.4 10*9/L (ref 0.3–0.8)
MONOCYTES RELATIVE PERCENT: 7 %
NEUTROPHILS ABSOLUTE COUNT: 3.7 10*9/L (ref 1.8–7.8)
NEUTROPHILS RELATIVE PERCENT: 64 %
NUCLEATED RED BLOOD CELLS: 0 /100{WBCs} (ref <=4)
PLATELET COUNT: 156 10*9/L (ref 150–450)
RED BLOOD CELL COUNT: 4.68 10*12/L (ref 4.26–5.60)
RED CELL DISTRIBUTION WIDTH: 13.2 % (ref 12.2–15.2)
WBC ADJUSTED: 5.7 10*9/L (ref 3.6–11.2)

## 2022-12-16 LAB — COMPREHENSIVE METABOLIC PANEL
ALBUMIN: 3.6 g/dL (ref 3.4–5.0)
ALKALINE PHOSPHATASE: 105 U/L (ref 46–116)
ALT (SGPT): 38 U/L (ref 10–49)
ANION GAP: 5 mmol/L (ref 5–14)
AST (SGOT): 31 U/L (ref <=34)
BILIRUBIN TOTAL: 0.8 mg/dL (ref 0.3–1.2)
BLOOD UREA NITROGEN: 20 mg/dL (ref 9–23)
BUN / CREAT RATIO: 19
CALCIUM: 8.7 mg/dL (ref 8.7–10.4)
CHLORIDE: 107 mmol/L (ref 98–107)
CO2: 29.7 mmol/L (ref 20.0–31.0)
CREATININE: 1.07 mg/dL
EGFR CKD-EPI (2021) MALE: 83 mL/min/{1.73_m2} (ref >=60)
GLUCOSE RANDOM: 148 mg/dL (ref 70–179)
POTASSIUM: 4 mmol/L (ref 3.4–4.8)
PROTEIN TOTAL: 6.5 g/dL (ref 5.7–8.2)
SODIUM: 142 mmol/L (ref 135–145)

## 2022-12-16 LAB — CEA: CARCINOEMBRYONIC ANTIGEN: <2 ng/mL (ref 0.0–5.0)

## 2022-12-16 MED ADMIN — heparin, porcine (PF) 100 unit/mL injection 500 Units: 500 [IU] | INTRAVENOUS | @ 18:00:00 | Stop: 2022-12-16

## 2022-12-16 MED ADMIN — trastuzumab-qyyp (TRAZIMERA) 600 mg in sodium chloride (NS) 0.9 % 250 mL IVPB: 6 mg/kg | INTRAVENOUS | @ 19:00:00 | Stop: 2022-12-16

## 2022-12-16 MED ADMIN — heparin, porcine (PF) 100 unit/mL injection 500 Units: 500 [IU] | INTRAVENOUS | @ 20:00:00 | Stop: 2022-12-16

## 2022-12-16 NOTE — Unmapped (Signed)
Patient arrived in the infusion clinic at 1342. Weight and vitals were obtained. Port was accessed during nurse visit, RN flushed with blood return, dressing clean, dry and intact. Labs were drawn during provider visit and resulted within treatment plan parameters. Patient was administered chemotherapy regimen as ordered without complications while in the clinic. Pt port heparin flushed and deaccessed, covered with 2x2 gauze and bandaid. Patient \\discharged  home to self care.

## 2022-12-31 DIAGNOSIS — C20 Malignant neoplasm of rectum: Principal | ICD-10-CM

## 2022-12-31 MED FILL — TUKYSA 50 MG TABLET: ORAL | 30 days supply | Qty: 120 | Fill #5

## 2022-12-31 MED FILL — TUKYSA 150 MG TABLET: ORAL | 30 days supply | Qty: 60 | Fill #5

## 2022-12-31 NOTE — Unmapped (Signed)
[facility] Refill Coordination Note    Max Stewart, DOB: 23-Sep-1970  Phone: [phone] (home) [phone] (work)      All above HIPAA information was verified with patient.         12/30/2022    11:04 AM   Specialty Rx Medication Refill Questionnaire   Which Medications would you like refilled and shipped? Tykysa   Please list all current allergies: Sheelfish cioro   Have you missed any doses in the last 30 days? No   Have you had any changes to your medication(s) since your last refill? No   How many days remaining of each medication do you have at home? Just 9ne   Have you experienced any side effects in the last 30 days? No   Please enter the full address (street address, city, state, zip code) where you would like your medication(s) to be delivered to. [address] , [place] [address]   Please specify on which day you would like your medication(s) to arrive. Note: if you need your medication(s) within 3 days, please call the pharmacy to schedule your order at [phone]  12/31/2022   Has your insurance changed since your last refill? No   Would you like a pharmacist to call you to discuss your medication(s)? No   Do you require a signature for your package? (Note: if we are billing Medicare Part B or your order contains a controlled substance, we will require a signature) No   Additional Comments: Only have enough for to.orrows dose.  Usually get a call from you and never received one for this refill         Completed refill call assessment today to schedule patient's medication shipment from the [facility] [phone]).  All relevant notes have been reviewed.       Confirmed patient received a Conservation officer, historic buildings and a Surveyor, mining with first shipment. The patient will receive a drug information handout for each medication shipped and additional FDA Medication Guides as required.         REFERRAL TO PHARMACIST     Referral to the pharmacist: Not needed      [facility]     Shipping address confirmed in Epic.     Delivery Scheduled: Yes, Expected medication delivery date: 12/31/2022.     Medication will be delivered via Same Day Courier to the prescription address in Epic [facility].    Max Stewart   [facility] Specialty Technician

## 2023-01-10 DIAGNOSIS — C20 Malignant neoplasm of rectum: Principal | ICD-10-CM

## 2023-01-12 DIAGNOSIS — C20 Malignant neoplasm of rectum: Principal | ICD-10-CM

## 2023-01-13 ENCOUNTER — Institutional Professional Consult (permissible substitution): Admit: 2023-01-13 | Discharge: 2023-01-14 | Payer: PRIVATE HEALTH INSURANCE

## 2023-01-13 ENCOUNTER — Ambulatory Visit: Admit: 2023-01-13 | Discharge: 2023-01-14 | Payer: PRIVATE HEALTH INSURANCE

## 2023-01-13 DIAGNOSIS — C20 Malignant neoplasm of rectum: Principal | ICD-10-CM

## 2023-01-13 LAB — CBC W/ AUTO DIFF
BASOPHILS ABSOLUTE COUNT: 0 10*9/L (ref 0.0–0.1)
BASOPHILS RELATIVE PERCENT: 0.7 %
EOSINOPHILS ABSOLUTE COUNT: 0.1 10*9/L (ref 0.0–0.5)
EOSINOPHILS RELATIVE PERCENT: 1.5 %
HEMATOCRIT: 43.6 % (ref 39.0–48.0)
HEMOGLOBIN: 14.7 g/dL (ref 12.9–16.5)
LYMPHOCYTES ABSOLUTE COUNT: 1.2 10*9/L (ref 1.1–3.6)
LYMPHOCYTES RELATIVE PERCENT: 27.3 %
MEAN CORPUSCULAR HEMOGLOBIN CONC: 33.8 g/dL (ref 32.0–36.0)
MEAN CORPUSCULAR HEMOGLOBIN: 30 pg (ref 25.9–32.4)
MEAN CORPUSCULAR VOLUME: 88.8 fL (ref 77.6–95.7)
MEAN PLATELET VOLUME: 9.7 fL (ref 6.8–10.7)
MONOCYTES ABSOLUTE COUNT: 0.3 10*9/L (ref 0.3–0.8)
MONOCYTES RELATIVE PERCENT: 6.1 %
NEUTROPHILS ABSOLUTE COUNT: 2.9 10*9/L (ref 1.8–7.8)
NEUTROPHILS RELATIVE PERCENT: 64.4 %
NUCLEATED RED BLOOD CELLS: 0 /100{WBCs} (ref <=4)
PLATELET COUNT: 147 10*9/L — ABNORMAL LOW (ref 150–450)
RED BLOOD CELL COUNT: 4.91 10*12/L (ref 4.26–5.60)
RED CELL DISTRIBUTION WIDTH: 13.4 % (ref 12.2–15.2)
WBC ADJUSTED: 4.6 10*9/L (ref 3.6–11.2)

## 2023-01-13 LAB — COMPREHENSIVE METABOLIC PANEL
ALBUMIN: 3.6 g/dL (ref 3.4–5.0)
ALKALINE PHOSPHATASE: 97 U/L (ref 46–116)
ALT (SGPT): 29 U/L (ref 10–49)
ANION GAP: 8 mmol/L (ref 5–14)
AST (SGOT): 26 U/L (ref <=34)
BILIRUBIN TOTAL: 0.8 mg/dL (ref 0.3–1.2)
BLOOD UREA NITROGEN: 19 mg/dL (ref 9–23)
BUN / CREAT RATIO: 17
CALCIUM: 8.8 mg/dL (ref 8.7–10.4)
CHLORIDE: 104 mmol/L (ref 98–107)
CO2: 28.6 mmol/L (ref 20.0–31.0)
CREATININE: 1.1 mg/dL (ref 0.73–1.18)
EGFR CKD-EPI (2021) MALE: 80 mL/min/{1.73_m2} (ref >=60)
GLUCOSE RANDOM: 251 mg/dL — ABNORMAL HIGH (ref 70–179)
POTASSIUM: 4.9 mmol/L — ABNORMAL HIGH (ref 3.4–4.8)
PROTEIN TOTAL: 6.6 g/dL (ref 5.7–8.2)
SODIUM: 141 mmol/L (ref 135–145)

## 2023-01-13 LAB — CEA: CARCINOEMBRYONIC ANTIGEN: <2 ng/mL (ref 0.0–5.0)

## 2023-01-13 MED ADMIN — heparin, porcine (PF) 100 unit/mL injection 500 Units: 500 [IU] | INTRAVENOUS | @ 19:00:00 | Stop: 2023-01-13

## 2023-01-13 MED ADMIN — heparin, porcine (PF) 100 unit/mL injection 500 Units: 500 [IU] | INTRAVENOUS | @ 17:00:00 | Stop: 2023-01-13

## 2023-01-13 MED ADMIN — trastuzumab-qyyp (TRAZIMERA) 600 mg in sodium chloride (NS) 0.9 % 250 mL IVPB: 6 mg/kg | INTRAVENOUS | @ 18:00:00 | Stop: 2023-01-13

## 2023-01-13 NOTE — Unmapped (Signed)
Patient arrived in the infusion clinic at 1230. Weight and vitals were obtained. Port was accessed during nurse visit, RN flushed with blood return, dressing clean, dry and intact. No labs orders or premeds per treatment plan. Patient was administered chemotherapy regimen as ordered without complications while in the clinic. Pt port heparin flushed and deaccessed, covered with 2x2 gauze and bandaid. Patient discharged home to self care.

## 2023-01-20 DIAGNOSIS — C20 Malignant neoplasm of rectum: Principal | ICD-10-CM

## 2023-01-26 DIAGNOSIS — C20 Malignant neoplasm of rectum: Principal | ICD-10-CM

## 2023-01-27 ENCOUNTER — Telehealth
Admit: 2023-01-27 | Discharge: 2023-01-27 | Payer: PRIVATE HEALTH INSURANCE | Attending: Hematology & Oncology | Primary: Hematology & Oncology

## 2023-01-27 ENCOUNTER — Ambulatory Visit: Admit: 2023-01-27 | Discharge: 2023-01-27 | Payer: PRIVATE HEALTH INSURANCE

## 2023-01-27 DIAGNOSIS — C20 Malignant neoplasm of rectum: Principal | ICD-10-CM

## 2023-01-27 MED ADMIN — gadopiclenol (ELUCIREM,VUEWAY) injection 10 mL: 10 mL | INTRAVENOUS | @ 18:00:00 | Stop: 2023-01-27

## 2023-01-27 NOTE — Unmapped (Signed)
Called patient to perform Nursing assessment and pre-video/phone visit. Reviewed allergies, medications, medical and surgical history. Completed outstanding screenings. Patient verbalized time and date of upcoming visit. No further questions or concerns voiced.     Pt at home, provider in clinic.

## 2023-01-27 NOTE — Unmapped (Signed)
[facility]     CONSULTING PROVIDERS  Dr. Rayetta Humphrey - Radiation Oncology  Dr. Rico Junker, [facility] Surgical Oncology   Dr. 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 FOLFOX x 8  05/2018 chemoradiotherapy to primary.   -Clinical complete response in liver and rectum, managed with Watch and Wait approach  03/2019 recurrent disease in rectum   05/2019 proctectomy with coloanal anastomosis, liver wedge resection (seg 3): liver with fibrosis, rectum invasive mod to poorly differentiated adenocarcinoma, 3 cm; into muscularis propria, no LVI, tumor at distal margin in area of tearing superceded by additional specimen, 0/16 nodes, two with prominent treatment effect. YpT2N0.  - complicated by abdominal abscess with MRSA  08/02/19 closure of diverting ileostomy  02/2020 2 sites of recurrence disease in liver adjacent to GB fossa  04/14/20 seg 4b wedge resection: two foci of adenocarcinoma c/w colorectal primary. Margin +; node negative   01/2021 SBRT 5000cGy in 29fx to met near GB fossa  06/2021 -09/2021 pelvic + possible right abd wall recurrence FOLFOX x 6  11/05/2021 tucatinib-trastuzumab start [baseline echo nl]  - c1 LFTs 4x ULN. Improved quickly, resumed at tucatinib 250 bid    MOLECULAR PROFILE   TEMPUS from 06/2021 rectal mass biopsy:  HER2 amplified  TP53 and APC mutation  RAS/RAF WT  TMB 6.3, MSS    Treatment plan:  tucatinib + trastuzumab.      ___________________________________________________________________    ASSESSMENT  1. 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 Stable    2. Type I DM- controlled on insulin pump, no complications from DM  3. Elevated transaminases, improved from prior, CTM  4. ECOG 0    RECOMMENDATIONS  1. Tucatinib 250 mg BID + trastuzumab   2. RTC  6 weeks visit.     DISCUSSION   Doing clinically well. Pending rads read but scans look essentially stable, maybe slight increase in presacral tissue but very subtle change. No indication for treatment change.     Medical decision making based high complexity of cancer and treatment risk of complications and monitoring and management of toxicity of treatment.     ______________________________________________________________    Interval History  Max Stewart returns today for ongoing management of his rectal cancer with liver mets.    Continues to have similar symptoms. Nothing new other than a mild URI  Remains active.       MEDICAL HISTORY  1. Type I DM- diagnosed at age 52, on insulin     SOCIAL HISTORY   Denies smoking and drugs. Drinks occasional alcohol. 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  deferred    OBJECTIVE DATA  Reviewed in Epic , CBC, CMP reviewed, look good       RADIOLOGY RESULTS   Scans this am (Scans personally reviewed by me and my interpretation as follows)   - ct chest good  - AP pending rads read- abd wall nodule stable at about 4 cm; presacral tissue maybe a few mm longer but change is very subtle. Dont see anything new.       [place]. I conducted a audio/video visit. I spent  13m 46s on the video call with the patient. I spent an additional 16 minutes on pre- and post-visit activities on the date of service .

## 2023-01-27 NOTE — Unmapped (Signed)
[facility] Specialty and Home Delivery Pharmacy Refill Coordination Note    Max Stewart, DOB: 03-24-1970  Phone: [phone] (home) [phone] (work)      All above HIPAA information was verified with patient.         01/26/2023     8:42 PM   Specialty Rx Medication Refill Questionnaire   Which Medications would you like refilled and shipped? Max Stewart, Have 3 days left   Please list all current allergies: Shellfish cipro   Have you missed any doses in the last 30 days? No   Have you had any changes to your medication(s) since your last refill? No   How many days remaining of each medication do you have at home? 3   Have you experienced any side effects in the last 30 days? No   Please enter the full address (street address, city, state, zip code) where you would like your medication(s) to be delivered to. [address], [place] [address]   Please specify on which day you would like your medication(s) to arrive. Note: if you need your medication(s) within 3 days, please call the pharmacy to schedule your order at [phone]  01/28/2023   Has your insurance changed since your last refill? No   Would you like a pharmacist to call you to discuss your medication(s)? No   Do you require a signature for your package? (Note: if we are billing Medicare Part B or your order contains a controlled substance, we will require a signature) No         Completed refill call assessment today to schedule patient's medication shipment from the [facility] Delivery Pharm[facility] [phone]).  All relevant notes have been reviewed.       Confirmed patient received a Conservation officer, historic buildings and a Surveyor, mining with first shipment. The patient will receive a drug information handout for each medication shipped and additional FDA Medication Guides as required.         REFERRAL TO PHARMACIST     Referral to the pharmacist: Not needed      [facility]     Shipping address confirmed in Epic.     Delivery Scheduled: Yes, Expected medication delivery date: 01/28/2023.     Medication will be delivered via Same Day Courier to the prescription address in Epic WAM.    Max Stewart [facility] Home Delivery Pharmacy Specialty Technician

## 2023-01-28 DIAGNOSIS — C20 Malignant neoplasm of rectum: Principal | ICD-10-CM

## 2023-01-28 MED FILL — TUKYSA 150 MG TABLET: ORAL | 30 days supply | Qty: 60 | Fill #6

## 2023-01-28 MED FILL — TUKYSA 50 MG TABLET: ORAL | 30 days supply | Qty: 120 | Fill #6

## 2023-01-31 DIAGNOSIS — C20 Malignant neoplasm of rectum: Principal | ICD-10-CM

## 2023-02-03 ENCOUNTER — Ambulatory Visit: Admit: 2023-02-03 | Discharge: 2023-02-04 | Payer: PRIVATE HEALTH INSURANCE

## 2023-02-03 DIAGNOSIS — C20 Malignant neoplasm of rectum: Principal | ICD-10-CM

## 2023-02-03 LAB — COMPREHENSIVE METABOLIC PANEL
ALBUMIN: 3.5 g/dL (ref 3.4–5.0)
ALKALINE PHOSPHATASE: 99 U/L (ref 46–116)
ALT (SGPT): 21 U/L (ref 10–49)
ANION GAP: 10 mmol/L (ref 5–14)
AST (SGOT): 20 U/L (ref <=34)
BILIRUBIN TOTAL: 0.6 mg/dL (ref 0.3–1.2)
BLOOD UREA NITROGEN: 24 mg/dL — ABNORMAL HIGH (ref 9–23)
BUN / CREAT RATIO: 24
CALCIUM: 8.8 mg/dL (ref 8.7–10.4)
CHLORIDE: 104 mmol/L (ref 98–107)
CO2: 27.8 mmol/L (ref 20.0–31.0)
CREATININE: 1 mg/dL (ref 0.73–1.18)
EGFR CKD-EPI (2021) MALE: 90 mL/min/{1.73_m2} (ref >=60)
GLUCOSE RANDOM: 161 mg/dL (ref 70–179)
POTASSIUM: 4.2 mmol/L (ref 3.4–4.8)
PROTEIN TOTAL: 6.6 g/dL (ref 5.7–8.2)
SODIUM: 142 mmol/L (ref 135–145)

## 2023-02-03 LAB — CBC W/ AUTO DIFF
BASOPHILS ABSOLUTE COUNT: 0 10*9/L (ref 0.0–0.1)
BASOPHILS RELATIVE PERCENT: 0.5 %
EOSINOPHILS ABSOLUTE COUNT: 0.1 10*9/L (ref 0.0–0.5)
EOSINOPHILS RELATIVE PERCENT: 1.5 %
HEMATOCRIT: 41.9 % (ref 39.0–48.0)
HEMOGLOBIN: 14.2 g/dL (ref 12.9–16.5)
LYMPHOCYTES ABSOLUTE COUNT: 1.7 10*9/L (ref 1.1–3.6)
LYMPHOCYTES RELATIVE PERCENT: 28 %
MEAN CORPUSCULAR HEMOGLOBIN CONC: 33.9 g/dL (ref 32.0–36.0)
MEAN CORPUSCULAR HEMOGLOBIN: 29.9 pg (ref 25.9–32.4)
MEAN CORPUSCULAR VOLUME: 88.4 fL (ref 77.6–95.7)
MEAN PLATELET VOLUME: 9.5 fL (ref 6.8–10.7)
MONOCYTES ABSOLUTE COUNT: 0.3 10*9/L (ref 0.3–0.8)
MONOCYTES RELATIVE PERCENT: 5.8 %
NEUTROPHILS ABSOLUTE COUNT: 3.9 10*9/L (ref 1.8–7.8)
NEUTROPHILS RELATIVE PERCENT: 64.2 %
NUCLEATED RED BLOOD CELLS: 0 /100{WBCs} (ref <=4)
PLATELET COUNT: 180 10*9/L (ref 150–450)
RED BLOOD CELL COUNT: 4.74 10*12/L (ref 4.26–5.60)
RED CELL DISTRIBUTION WIDTH: 13.1 % (ref 12.2–15.2)
WBC ADJUSTED: 6 10*9/L (ref 3.6–11.2)

## 2023-02-03 LAB — CEA: CARCINOEMBRYONIC ANTIGEN: <2 ng/mL (ref 0.0–5.0)

## 2023-02-03 MED ADMIN — trastuzumab-qyyp (TRAZIMERA) 600 mg in sodium chloride (NS) 0.9 % 250 mL IVPB: 6 mg/kg | INTRAVENOUS | @ 20:00:00 | Stop: 2023-02-03

## 2023-02-03 MED ADMIN — heparin, porcine (PF) 100 unit/mL injection 500 Units: 500 [IU] | INTRAVENOUS | @ 21:00:00 | Stop: 2023-02-03

## 2023-02-03 NOTE — Unmapped (Signed)
Infusion on 02/03/2023   Component Date Value Ref Range Status    Sodium 02/03/2023 142  135 - 145 mmol/L Final    Potassium 02/03/2023 4.2  3.4 - 4.8 mmol/L Final    Chloride 02/03/2023 104  98 - 107 mmol/L Final    CO2 02/03/2023 27.8  20.0 - 31.0 mmol/L Final    Anion Gap 02/03/2023 10  5 - 14 mmol/L Final    BUN 02/03/2023 24 (H)  9 - 23 mg/dL Final    Creatinine 70/62/3762 1.00  0.73 - 1.18 mg/dL Final    BUN/Creatinine Ratio 02/03/2023 24   Final    eGFR CKD-EPI (2021) Male 02/03/2023 90  >=60 mL/min/1.53m2 Final    eGFR calculated with CKD-EPI 2021 equation in accordance with [employer] and [employer] of Nephrology Task Force recommendations.    Glucose 02/03/2023 161  70 - 179 mg/dL Final    Calcium 83/15/1761 8.8  8.7 - 10.4 mg/dL Final    Albumin 60/73/7106 3.5  3.4 - 5.0 g/dL Final    Total Protein 02/03/2023 6.6  5.7 - 8.2 g/dL Final    Total Bilirubin 02/03/2023 0.6  0.3 - 1.2 mg/dL Final    AST 26/94/8546 20  <=34 U/L Final    ALT 02/03/2023 21  10 - 49 U/L Final    Alkaline Phosphatase 02/03/2023 99  46 - 116 U/L Final    WBC 02/03/2023 6.0  3.6 - 11.2 10*9/L Final    RBC 02/03/2023 4.74  4.26 - 5.60 10*12/L Final    HGB 02/03/2023 14.2  12.9 - 16.5 g/dL Final    HCT 27/04/5007 41.9  39.0 - 48.0 % Final    MCV 02/03/2023 88.4  77.6 - 95.7 fL Final    MCH 02/03/2023 29.9  25.9 - 32.4 pg Final    MCHC 02/03/2023 33.9  32.0 - 36.0 g/dL Final    RDW 38/18/2993 13.1  12.2 - 15.2 % Final    MPV 02/03/2023 9.5  6.8 - 10.7 fL Final    Platelet 02/03/2023 180  150 - 450 10*9/L Final    nRBC 02/03/2023 0  <=4 /100 WBCs Final    Neutrophils % 02/03/2023 64.2  % Final    Lymphocytes % 02/03/2023 28.0  % Final    Monocytes % 02/03/2023 5.8  % Final    Eosinophils % 02/03/2023 1.5  % Final    Basophils % 02/03/2023 0.5  % Final    Absolute Neutrophils 02/03/2023 3.9  1.8 - 7.8 10*9/L Final    Absolute Lymphocytes 02/03/2023 1.7  1.1 - 3.6 10*9/L Final    Absolute Monocytes 02/03/2023 0.3  0.3 - 0.8 10*9/L Final    Absolute Eosinophils 02/03/2023 0.1  0.0 - 0.5 10*9/L Final    Absolute Basophils 02/03/2023 0.0  0.0 - 0.1 10*9/L Final

## 2023-02-03 NOTE — Unmapped (Signed)
Patient arrived to infusion services. His weight and vital signs were updated. He denied any new or concerning symptoms; felt well to receive treatment today.  His port was accessed with brisk blood return noted and port lab draw was completed.  There were no parameters for today's treatment; the lab results were reviewed.  Trastuzumab was administered as ordered. The IV line was flushed with normal saline. Port was flushed with brisk blood return, then heparin-locked and de-accessed. Gauze and a band aid were applied. He tolerated treatment without difficulty.  Patient was instructed to stop by the check out desk and was discharged home to self-care.

## 2023-02-03 NOTE — Unmapped (Signed)
Called Max Stewart as it is 1337 and he had a nurse visit at 1300 w/infusion at 1400 and has not shown. No answer. LMTCB- clinic number provided.

## 2023-02-08 DIAGNOSIS — C20 Malignant neoplasm of rectum: Principal | ICD-10-CM

## 2023-02-14 DIAGNOSIS — C20 Malignant neoplasm of rectum: Principal | ICD-10-CM

## 2023-02-17 DIAGNOSIS — C20 Malignant neoplasm of rectum: Principal | ICD-10-CM

## 2023-02-17 MED ORDER — TUKYSA 50 MG TABLET
ORAL_TABLET | Freq: Two times a day (BID) | ORAL | 6 refills | 30.00 days
Start: 2023-02-17 — End: ?

## 2023-02-17 MED ORDER — TUKYSA 150 MG TABLET
ORAL_TABLET | Freq: Two times a day (BID) | ORAL | 6 refills | 30.00 days
Start: 2023-02-17 — End: ?

## 2023-02-18 DIAGNOSIS — C20 Malignant neoplasm of rectum: Principal | ICD-10-CM

## 2023-02-19 DIAGNOSIS — C20 Malignant neoplasm of rectum: Principal | ICD-10-CM

## 2023-02-19 NOTE — Unmapped (Signed)
[facility] Specialty and Home Delivery Pharmacy Refill Coordination Note    Max Stewart, DOB: 08/19/1970  Phone: [phone] (home) [phone] (work)      All above HIPAA information was verified with patient.         02/17/2023     5:48 PM   Specialty Rx Medication Refill Questionnaire   Which Medications would you like refilled and shipped? Tukysa 150 50 mg   Please list all current allergies: [place]elfisg cipro   Have you missed any doses in the last 30 days? No   Have you had any changes to your medication(s) since your last refill? No   How many days remaining of each medication do you have at home? 7   Have you experienced any side effects in the last 30 days? No   Please enter the full address (street address, city, state, zip code) where you would like your medication(s) to be delivered to. [address] [place] [address]   Please specify on which day you would like your medication(s) to arrive. Note: if you need your medication(s) within 3 days, please call the pharmacy to schedule your order at [phone]  02/23/2023   Has your insurance changed since your last refill? No   Would you like a pharmacist to call you to discuss your medication(s)? No   Do you require a signature for your package? (Note: if we are billing Medicare Part B or your order contains a controlled substance, we will require a signature) No         Completed refill call assessment today to schedule patient's medication shipment from the [facility] and [facility] Delivery Pharmacy [phone]).  All relevant notes have been reviewed.       Confirmed patient received a Conservation officer, historic buildings and a Surveyor, mining with first shipment. The patient will receive a drug information handout for each medication shipped and additional FDA Medication Guides as required.         REFERRAL TO PHARMACIST     Referral to the pharmacist: Not needed      [facility]     Shipping address confirmed in Epic.     Delivery Scheduled: Yes, Expected medication delivery date: 02/22/2023.  However, Rx request for refills was sent to the provider as there are none remaining.     Medication will be delivered via Next Day Courier to the prescription address in Epic WAM.    [name]   [facility] Specialty and Home Delivery Pharmacy Specialty Technician

## 2023-02-21 DIAGNOSIS — C20 Malignant neoplasm of rectum: Principal | ICD-10-CM

## 2023-02-21 MED ORDER — TUCATINIB 50 MG TABLET
ORAL_TABLET | Freq: Two times a day (BID) | ORAL | 6 refills | 30.00 days | Status: CP
Start: 2023-02-21 — End: ?

## 2023-02-21 MED ORDER — TUCATINIB 150 MG TABLET
ORAL_TABLET | Freq: Two times a day (BID) | ORAL | 6 refills | 30.00 days | Status: CP
Start: 2023-02-21 — End: ?
  Filled 2023-02-22: qty 120, 30d supply, fill #0

## 2023-02-21 NOTE — Unmapped (Signed)
It will not sign- have to start over

## 2023-02-22 DIAGNOSIS — C20 Malignant neoplasm of rectum: Principal | ICD-10-CM

## 2023-02-22 MED FILL — TUKYSA 150 MG TABLET: ORAL | 30 days supply | Qty: 60 | Fill #0

## 2023-02-24 ENCOUNTER — Encounter: Admit: 2023-02-24 | Discharge: 2023-02-25 | Payer: MEDICARE

## 2023-02-24 ENCOUNTER — Ambulatory Visit: Admit: 2023-02-24 | Discharge: 2023-02-25 | Payer: MEDICARE

## 2023-02-24 DIAGNOSIS — C20 Malignant neoplasm of rectum: Principal | ICD-10-CM

## 2023-02-24 LAB — CBC W/ AUTO DIFF
BASOPHILS ABSOLUTE COUNT: 0 10*9/L (ref 0.0–0.1)
BASOPHILS RELATIVE PERCENT: 0.6 %
EOSINOPHILS ABSOLUTE COUNT: 0.1 10*9/L (ref 0.0–0.5)
EOSINOPHILS RELATIVE PERCENT: 1.6 %
HEMATOCRIT: 42.1 % (ref 39.0–48.0)
HEMOGLOBIN: 14.2 g/dL (ref 12.9–16.5)
LYMPHOCYTES ABSOLUTE COUNT: 1.6 10*9/L (ref 1.1–3.6)
LYMPHOCYTES RELATIVE PERCENT: 29.6 %
MEAN CORPUSCULAR HEMOGLOBIN CONC: 33.7 g/dL (ref 32.0–36.0)
MEAN CORPUSCULAR HEMOGLOBIN: 30.3 pg (ref 25.9–32.4)
MEAN CORPUSCULAR VOLUME: 90 fL (ref 77.6–95.7)
MEAN PLATELET VOLUME: 9.9 fL (ref 6.8–10.7)
MONOCYTES ABSOLUTE COUNT: 0.3 10*9/L (ref 0.3–0.8)
MONOCYTES RELATIVE PERCENT: 6.6 %
NEUTROPHILS ABSOLUTE COUNT: 3.3 10*9/L (ref 1.8–7.8)
NEUTROPHILS RELATIVE PERCENT: 61.6 %
NUCLEATED RED BLOOD CELLS: 0 /100{WBCs} (ref <=4)
PLATELET COUNT: 143 10*9/L — ABNORMAL LOW (ref 150–450)
RED BLOOD CELL COUNT: 4.68 10*12/L (ref 4.26–5.60)
RED CELL DISTRIBUTION WIDTH: 13.4 % (ref 12.2–15.2)
WBC ADJUSTED: 5.3 10*9/L (ref 3.6–11.2)

## 2023-02-24 LAB — COMPREHENSIVE METABOLIC PANEL
ALBUMIN: 3.5 g/dL (ref 3.4–5.0)
ALKALINE PHOSPHATASE: 95 U/L (ref 46–116)
ALT (SGPT): 26 U/L (ref 10–49)
ANION GAP: 11 mmol/L (ref 5–14)
AST (SGOT): 23 U/L (ref <=34)
BILIRUBIN TOTAL: 0.6 mg/dL (ref 0.3–1.2)
BLOOD UREA NITROGEN: 25 mg/dL — ABNORMAL HIGH (ref 9–23)
BUN / CREAT RATIO: 24
CALCIUM: 8.7 mg/dL (ref 8.7–10.4)
CHLORIDE: 104 mmol/L (ref 98–107)
CO2: 25.6 mmol/L (ref 20.0–31.0)
CREATININE: 1.06 mg/dL (ref 0.73–1.18)
EGFR CKD-EPI (2021) MALE: 84 mL/min/{1.73_m2} (ref >=60)
GLUCOSE RANDOM: 200 mg/dL — ABNORMAL HIGH (ref 70–179)
POTASSIUM: 4.4 mmol/L (ref 3.4–4.8)
PROTEIN TOTAL: 6.5 g/dL (ref 5.7–8.2)
SODIUM: 141 mmol/L (ref 135–145)

## 2023-02-24 LAB — CEA: CARCINOEMBRYONIC ANTIGEN: <2 ng/mL (ref 0.0–5.0)

## 2023-02-24 MED ADMIN — trastuzumab-qyyp (TRAZIMERA) 600 mg in sodium chloride (NS) 0.9 % 250 mL IVPB: 6 mg/kg | INTRAVENOUS | @ 19:00:00 | Stop: 2023-02-24

## 2023-02-24 MED ADMIN — heparin, porcine (PF) 100 unit/mL injection 500 Units: 500 [IU] | INTRAVENOUS | @ 20:00:00 | Stop: 2023-02-24

## 2023-02-25 NOTE — Unmapped (Signed)
No visits with results within 1 Day(s) from this visit.   Latest known visit with results is:   Infusion on 02/03/2023   Component Date Value Ref Range Status    Sodium 02/24/2023 141  135 - 145 mmol/L Final    Potassium 02/24/2023 4.4  3.4 - 4.8 mmol/L Final    Chloride 02/24/2023 104  98 - 107 mmol/L Final    CO2 02/24/2023 25.6  20.0 - 31.0 mmol/L Final    Anion Gap 02/24/2023 11  5 - 14 mmol/L Final    BUN 02/24/2023 25 (H)  9 - 23 mg/dL Final    Creatinine 02/10/7251 1.06  0.73 - 1.18 mg/dL Final    BUN/Creatinine Ratio 02/24/2023 24   Final    eGFR CKD-EPI (2021) Male 02/24/2023 84  >=60 mL/min/1.58m2 Final    eGFR calculated with CKD-EPI 2021 equation in accordance with [employer] of Nephrology Task Force recommendations.    Glucose 02/24/2023 200 (H)  70 - 179 mg/dL Final    Calcium 66/44/0347 8.7  8.7 - 10.4 mg/dL Final    Albumin 42/59/5638 3.5  3.4 - 5.0 g/dL Final    Total Protein 02/24/2023 6.5  5.7 - 8.2 g/dL Final    Total Bilirubin 02/24/2023 0.6  0.3 - 1.2 mg/dL Final    AST 75/64/3329 23  <=34 U/L Final    ALT 02/24/2023 26  10 - 49 U/L Final    Alkaline Phosphatase 02/24/2023 95  46 - 116 U/L Final    WBC 02/24/2023 5.3  3.6 - 11.2 10*9/L Final    RBC 02/24/2023 4.68  4.26 - 5.60 10*12/L Final    HGB 02/24/2023 14.2  12.9 - 16.5 g/dL Final    HCT 51/88/4166 42.1  39.0 - 48.0 % Final    MCV 02/24/2023 90.0  77.6 - 95.7 fL Final    MCH 02/24/2023 30.3  25.9 - 32.4 pg Final    MCHC 02/24/2023 33.7  32.0 - 36.0 g/dL Final    RDW 08/08/1599 13.4  12.2 - 15.2 % Final    MPV 02/24/2023 9.9  6.8 - 10.7 fL Final    Platelet 02/24/2023 143 (L)  150 - 450 10*9/L Final    nRBC 02/24/2023 0  <=4 /100 WBCs Final    Neutrophils % 02/24/2023 61.6  % Final    Lymphocytes % 02/24/2023 29.6  % Final    Monocytes % 02/24/2023 6.6  % Final    Eosinophils % 02/24/2023 1.6  % Final    Basophils % 02/24/2023 0.6  % Final    Absolute Neutrophils 02/24/2023 3.3  1.8 - 7.8 10*9/L Final Absolute Lymphocytes 02/24/2023 1.6  1.1 - 3.6 10*9/L Final    Absolute Monocytes 02/24/2023 0.3  0.3 - 0.8 10*9/L Final    Absolute Eosinophils 02/24/2023 0.1  0.0 - 0.5 10*9/L Final    Absolute Basophils 02/24/2023 0.0  0.0 - 0.1 10*9/L Final    Sodium 02/03/2023 142  135 - 145 mmol/L Final    Potassium 02/03/2023 4.2  3.4 - 4.8 mmol/L Final    Chloride 02/03/2023 104  98 - 107 mmol/L Final    CO2 02/03/2023 27.8  20.0 - 31.0 mmol/L Final    Anion Gap 02/03/2023 10  5 - 14 mmol/L Final    BUN 02/03/2023 24 (H)  9 - 23 mg/dL Final    Creatinine 09/32/3557 1.00  0.73 - 1.18 mg/dL Final    BUN/Creatinine Ratio 02/03/2023 24  Final    eGFR CKD-EPI (2021) Male 02/03/2023 90  >=60 mL/min/1.55m2 Final    eGFR calculated with CKD-EPI 2021 equation in accordance with [employer] of Nephrology Task Force recommendations.    Glucose 02/03/2023 161  70 - 179 mg/dL Final    Calcium 16/11/9602 8.8  8.7 - 10.4 mg/dL Final    Albumin 54/10/8117 3.5  3.4 - 5.0 g/dL Final    Total Protein 02/03/2023 6.6  5.7 - 8.2 g/dL Final    Total Bilirubin 02/03/2023 0.6  0.3 - 1.2 mg/dL Final    AST 14/78/2956 20  <=34 U/L Final    ALT 02/03/2023 21  10 - 49 U/L Final    Alkaline Phosphatase 02/03/2023 99  46 - 116 U/L Final    CEA 02/03/2023 <2.0  0.0 - 5.0 ng/mL Final    WBC 02/03/2023 6.0  3.6 - 11.2 10*9/L Final    RBC 02/03/2023 4.74  4.26 - 5.60 10*12/L Final    HGB 02/03/2023 14.2  12.9 - 16.5 g/dL Final    HCT 21/30/8657 41.9  39.0 - 48.0 % Final    MCV 02/03/2023 88.4  77.6 - 95.7 fL Final    MCH 02/03/2023 29.9  25.9 - 32.4 pg Final    MCHC 02/03/2023 33.9  32.0 - 36.0 g/dL Final    RDW 84/69/6295 13.1  12.2 - 15.2 % Final    MPV 02/03/2023 9.5  6.8 - 10.7 fL Final    Platelet 02/03/2023 180  150 - 450 10*9/L Final    nRBC 02/03/2023 0  <=4 /100 WBCs Final    Neutrophils % 02/03/2023 64.2  % Final    Lymphocytes % 02/03/2023 28.0  % Final    Monocytes % 02/03/2023 5.8  % Final    Eosinophils % 02/03/2023 1.5  % Final    Basophils % 02/03/2023 0.5  % Final    Absolute Neutrophils 02/03/2023 3.9  1.8 - 7.8 10*9/L Final    Absolute Lymphocytes 02/03/2023 1.7  1.1 - 3.6 10*9/L Final    Absolute Monocytes 02/03/2023 0.3  0.3 - 0.8 10*9/L Final    Absolute Eosinophils 02/03/2023 0.1  0.0 - 0.5 10*9/L Final    Absolute Basophils 02/03/2023 0.0  0.0 - 0.1 10*9/L Final

## 2023-02-25 NOTE — Unmapped (Signed)
Patient arrived in the infusion clinic at 1400.  Weight and Vitals were obtained. Port was accessed during RN visit, RN flushed with blood return, dressing clean, dry, and intact. Labs were drawn during RN visit and resulted within treatment plan parameters.  No premedications given per treatment plan. Patient was administered chemotherapy regiment as ordered without complications while in the clinic. Port de accessed and heparin flushed and post treatment blood return present.   Patient given option of printed after visit summary at checkout and then discharged home to self care.

## 2023-03-04 MED ORDER — LOPERAMIDE 2 MG CAPSULE
ORAL_CAPSULE | 3 refills | 0.00 days | Status: CP
Start: 2023-03-04 — End: ?

## 2023-03-17 ENCOUNTER — Ambulatory Visit
Admit: 2023-03-17 | Discharge: 2023-03-17 | Payer: MEDICARE | Attending: Hematology & Oncology | Primary: Hematology & Oncology

## 2023-03-17 ENCOUNTER — Ambulatory Visit: Admit: 2023-03-17 | Discharge: 2023-03-17 | Payer: MEDICARE

## 2023-03-17 ENCOUNTER — Encounter: Admit: 2023-03-17 | Discharge: 2023-03-17 | Payer: MEDICARE

## 2023-03-17 DIAGNOSIS — C20 Malignant neoplasm of rectum: Principal | ICD-10-CM

## 2023-03-17 LAB — CBC W/ AUTO DIFF
BASOPHILS ABSOLUTE COUNT: 0 10*9/L (ref 0.0–0.1)
BASOPHILS RELATIVE PERCENT: 0.6 %
EOSINOPHILS ABSOLUTE COUNT: 0.1 10*9/L (ref 0.0–0.5)
EOSINOPHILS RELATIVE PERCENT: 1.7 %
HEMATOCRIT: 45.5 % (ref 39.0–48.0)
HEMOGLOBIN: 15.2 g/dL (ref 12.9–16.5)
LYMPHOCYTES ABSOLUTE COUNT: 1.6 10*9/L (ref 1.1–3.6)
LYMPHOCYTES RELATIVE PERCENT: 34 %
MEAN CORPUSCULAR HEMOGLOBIN CONC: 33.5 g/dL (ref 32.0–36.0)
MEAN CORPUSCULAR HEMOGLOBIN: 30 pg (ref 25.9–32.4)
MEAN CORPUSCULAR VOLUME: 89.6 fL (ref 77.6–95.7)
MEAN PLATELET VOLUME: 9.6 fL (ref 6.8–10.7)
MONOCYTES ABSOLUTE COUNT: 0.4 10*9/L (ref 0.3–0.8)
MONOCYTES RELATIVE PERCENT: 7.6 %
NEUTROPHILS ABSOLUTE COUNT: 2.7 10*9/L (ref 1.8–7.8)
NEUTROPHILS RELATIVE PERCENT: 56.1 %
NUCLEATED RED BLOOD CELLS: 0 /100{WBCs} (ref <=4)
PLATELET COUNT: 180 10*9/L (ref 150–450)
RED BLOOD CELL COUNT: 5.08 10*12/L (ref 4.26–5.60)
RED CELL DISTRIBUTION WIDTH: 13.3 % (ref 12.2–15.2)
WBC ADJUSTED: 4.8 10*9/L (ref 3.6–11.2)

## 2023-03-17 LAB — COMPREHENSIVE METABOLIC PANEL
ALBUMIN: 3.8 g/dL (ref 3.4–5.0)
ALKALINE PHOSPHATASE: 112 U/L (ref 46–116)
ALT (SGPT): 41 U/L (ref 10–49)
ANION GAP: 12 mmol/L (ref 5–14)
AST (SGOT): 42 U/L — ABNORMAL HIGH (ref <=34)
BILIRUBIN TOTAL: 1 mg/dL (ref 0.3–1.2)
BLOOD UREA NITROGEN: 23 mg/dL (ref 9–23)
BUN / CREAT RATIO: 21
CALCIUM: 9 mg/dL (ref 8.7–10.4)
CHLORIDE: 104 mmol/L (ref 98–107)
CO2: 26.8 mmol/L (ref 20.0–31.0)
CREATININE: 1.1 mg/dL (ref 0.73–1.18)
EGFR CKD-EPI (2021) MALE: 80 mL/min/{1.73_m2} (ref >=60)
GLUCOSE RANDOM: 132 mg/dL (ref 70–179)
POTASSIUM: 4.2 mmol/L (ref 3.4–4.8)
PROTEIN TOTAL: 6.9 g/dL (ref 5.7–8.2)
SODIUM: 143 mmol/L (ref 135–145)

## 2023-03-17 LAB — CEA: CARCINOEMBRYONIC ANTIGEN: 2.1 ng/mL (ref 0.0–5.0)

## 2023-03-17 MED ADMIN — trastuzumab-qyyp (TRAZIMERA) 600 mg in sodium chloride (NS) 0.9 % 250 mL IVPB: 6 mg/kg | INTRAVENOUS | @ 19:00:00 | Stop: 2023-03-17

## 2023-03-17 MED ADMIN — heparin, porcine (PF) 100 unit/mL injection 500 Units: 500 [IU] | INTRAVENOUS | @ 19:00:00 | Stop: 2023-03-17

## 2023-03-17 NOTE — Unmapped (Signed)
Clinical Support on 03/17/2023   Component Date Value Ref Range Status    Sodium 03/17/2023 143  135 - 145 mmol/L Final    Potassium 03/17/2023 4.2  3.4 - 4.8 mmol/L Final    Chloride 03/17/2023 104  98 - 107 mmol/L Final    CO2 03/17/2023 26.8  20.0 - 31.0 mmol/L Final    Anion Gap 03/17/2023 12  5 - 14 mmol/L Final    BUN 03/17/2023 23  9 - 23 mg/dL Final    Creatinine 16/11/9602 1.10  0.73 - 1.18 mg/dL Final    BUN/Creatinine Ratio 03/17/2023 21   Final    eGFR CKD-EPI (2021) Male 03/17/2023 80  >=60 mL/min/1.4m2 Final    eGFR calculated with CKD-EPI 2021 equation in accordance with [employer] and [employer] of Nephrology Task Force recommendations.    Glucose 03/17/2023 132  70 - 179 mg/dL Final    Calcium 54/10/8117 9.0  8.7 - 10.4 mg/dL Final    Albumin 14/78/2956 3.8  3.4 - 5.0 g/dL Final    Total Protein 03/17/2023 6.9  5.7 - 8.2 g/dL Final    Total Bilirubin 03/17/2023 1.0  0.3 - 1.2 mg/dL Final    AST 21/30/8657 42 (H)  <=34 U/L Final    ALT 03/17/2023 41  10 - 49 U/L Final    Alkaline Phosphatase 03/17/2023 112  46 - 116 U/L Final    CEA 03/17/2023 2.1  0.0 - 5.0 ng/mL Final    WBC 03/17/2023 4.8  3.6 - 11.2 10*9/L Final    RBC 03/17/2023 5.08  4.26 - 5.60 10*12/L Final    HGB 03/17/2023 15.2  12.9 - 16.5 g/dL Final    HCT 84/69/6295 45.5  39.0 - 48.0 % Final    MCV 03/17/2023 89.6  77.6 - 95.7 fL Final    MCH 03/17/2023 30.0  25.9 - 32.4 pg Final    MCHC 03/17/2023 33.5  32.0 - 36.0 g/dL Final    RDW 28/41/3244 13.3  12.2 - 15.2 % Final    MPV 03/17/2023 9.6  6.8 - 10.7 fL Final    Platelet 03/17/2023 180  150 - 450 10*9/L Final    nRBC 03/17/2023 0  <=4 /100 WBCs Final    Neutrophils % 03/17/2023 56.1  % Final    Lymphocytes % 03/17/2023 34.0  % Final    Monocytes % 03/17/2023 7.6  % Final    Eosinophils % 03/17/2023 1.7  % Final    Basophils % 03/17/2023 0.6  % Final    Absolute Neutrophils 03/17/2023 2.7  1.8 - 7.8 10*9/L Final    Absolute Lymphocytes 03/17/2023 1.6  1.1 - 3.6 10*9/L Final    Absolute Monocytes 03/17/2023 0.4  0.3 - 0.8 10*9/L Final    Absolute Eosinophils 03/17/2023 0.1  0.0 - 0.5 10*9/L Final    Absolute Basophils 03/17/2023 0.0  0.0 - 0.1 10*9/L Final          RED ZONE Means: RED ZONE: Take action now!     You need to be seen right away  Symptoms are at a severe level of discomfort    Call 911 or go to your nearest  Hospital for help     - Bleeding that will not stop    - Hard to breathe    - New seizure - Chest pain  - Fall or passing out  -Thoughts of hurting    yourself or others      Call 911 if  you are going into the RED ZONE                  YELLOW ZONE Means:     Please call with any new or worsening symptom(s), even if not on this list.  Call [phone]  After hours, weekends, and holidays - you will reach a long recording with specific instructions, If not in an emergency such as above, please listen closely all the way to the end and choose the option that relates to your need.   You can be seen by a provider the same day through our Same Day Acute Care for Patients with Cancer program.      YELLOW ZONE: Take action today     Symptoms are new or worsening  You are not within your goal range for:    - Pain    - Shortness of breath    - Bleeding (nose, urine, stool, wound)    - Feeling sick to your stomach and throwing up    - Mouth sores/pain in your mouth or throat    - Hard stool or very loose stools (increase in       ostomy output)    - No urine for 12 hours    - Feeding tube or other catheter/tube issue    - Redness or pain at previous IV or port/catheter site    - Depressed or anxiety   - Swelling (leg, arm, abdomen,     face, neck)  - Skin rash or skin changes  - Wound issues (redness, drainage,    re-opened)  - Confusion  - Vision changes  - Fever >100.4 F or chills  - Worsening cough with mucus that is    green, yellow, or bloody  - Pain or burning when going to the    bathroom  - Home Infusion Pump Issue- call    [phone]         Call your healthcare provider if you are going into the YELLOW ZONE     GREEN ZONE Means:  Your symptoms are under controls  Continue to take your medicine as ordered  Keep all visits to the provider GREEN ZONE: You are in control  No increase or worsening symptoms  Able to take your medicine  Able to drink and eat    - DO NOT use MyChart messages to report red or yellow symptoms. Allow up to 3    business days for a reply.  -MyChart is for non-urgent medication refills, scheduling requests, or other general questions.         [id number] Rev. 08/07/2021  Approved by Oncology Patient Education Committee

## 2023-03-17 NOTE — Unmapped (Signed)
[facility]     CONSULTING PROVIDERS  Dr. Rayetta Humphrey - Radiation Oncology  Dr. Rico Junker, [facility]   Dr. Pearlean Brownie, [facility] Surgical Oncology  ____________________________________________________________________    CANCER HISTORY  Diagnosis 10/12/17 metastatic Rectal Adenocarcinoma with synchronous liver mets, work-up with a 4cm rectal mass and 5 metastatic hepatic lesions    11/2017-03/2018. FOLFOX x 8  05/2018 chemoradiotherapy to primary.   -Clinical complete response in liver and rectum, managed with Watch and Wait approach  03/2019 recurrent disease in rectum   05/2019 proctectomy with coloanal anastomosis, liver wedge resection (seg 3): liver with fibrosis, rectum invasive mod to poorly differentiated adenocarcinoma, 3 cm; into muscularis propria, no LVI, tumor at distal margin in area of tearing superceded by additional specimen, 0/16 nodes, two with prominent treatment effect. YpT2N0.  - complicated by abdominal abscess with MRSA  08/02/19 closure of diverting ileostomy  02/2020 2 sites of recurrence disease in liver adjacent to GB fossa  04/14/20 seg 4b wedge resection: two foci of adenocarcinoma c/w colorectal primary. Margin +; node negative   01/2021 SBRT 5000cGy in 35fx to met near GB fossa  06/2021 -09/2021 pelvic + possible right abd wall recurrence FOLFOX x 6  11/05/2021 tucatinib-trastuzumab start [baseline echo nl]  - c1 LFTs 4x ULN. Improved quickly, resumed at tucatinib 250 bid    MOLECULAR PROFILE   TEMPUS from 06/2021 rectal mass biopsy:  HER2 amplified  TP53 and APC mutation  RAS/RAF WT  TMB 6.3, MSS    Treatment plan:  tucatinib + trastuzumab.      ___________________________________________________________________    ASSESSMENT  1. 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 Stable    2. Type I DM- controlled on insulin pump, no complications from DM  3. Elevated transaminases, improved from prior, CTM  4. ECOG 0    RECOMMENDATIONS  1. Tucatinib 250 mg BID + trastuzumab   2. RTC  6 weeks visit.     DISCUSSION   Same AEs. Feels like he has a handle on symptoms    Medical decision making based high complexity of cancer and treatment risk of complications and monitoring and management of toxicity of treatment.     ______________________________________________________________    Interval History  Max Stewart returns today for ongoing management of his rectal cancer with liver mets.    Doing well. Same chronic issues with bowels, urgency with frequent incontinence   No abd pain  Eating well  Energy fine    MEDICAL HISTORY  1. Type I DM- diagnosed at age 53, on insulin     SOCIAL HISTORY   Denies smoking and drugs. Drinks occasional alcohol. 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  GENERAL: well developed, well nourished, in no distress  HEENT: NCAT, pupils equal, sclerae anicteric, PSYCH: full and appropriate range of affect with good insight and judgement  LYMPH: No cervical or supraclav adenopathy  LUNGS: clear bilaterally with good air excursion and no wheezing, rhonci, rales  COR: reg,  no murmurs, rubs, gallops   GI: abdomen soft, nontender, no ascites, normoactive BS, no hepatosplenomegaly or masses.   EXT: no edema  SKIN: No rashes     OBJECTIVE DATA  Reviewed in Epic , CBC, CMP reviewed, look good     RADIOLOGY RESULTS   none

## 2023-03-17 NOTE — Unmapped (Signed)
Patient arrived to infusion chair ambulatory following his scheduled nurse/lab and provider visit for today's infusion appointment. Weight and VS taken at earlier appointment. VS repeated in infusion. Port accessed and labs drawn at nurse visit. Port remains accessed with blood return noted and flushes well. No lab parameters listed in treatment plan. Results reviewed for critical levels. Dose calculated, verified and released to pharmacy for preparation. No premedication ordered or required. Infusion given over 30 minutes as per order. Well tolerated, IV tubing and line flushed, blood return noted, port flushed, heparin instilled and needle removed. Site dressed with 2x2 gauze and band aid. No bleeding noted. Discharged ambulatory in stable condition verbalizing no complaints and instructed to check out with front desk and verify appointments are scheduled prior to leaving clinic.

## 2023-03-25 DIAGNOSIS — C20 Malignant neoplasm of rectum: Principal | ICD-10-CM

## 2023-03-25 NOTE — Unmapped (Signed)
 [facility] and Home Delivery Pharmacy Refill Coordination Note    Specialty Medication(s) to be Shipped:   Hematology/Oncology: Tukysa 50 mg and Tukysa 150 mg    Other medication(s) to be shipped: No additional medications requested for fill at this time     Max Stewart, DOB: 1970/07/04  Phone: [phone] (home) [phone] (work)      All above HIPAA information was verified with patient.     Was a Nurse, learning disability used for this call? No    Completed refill call assessment today to schedule patient's medication shipment from the [facility] and Home Delivery Pharmacy  [phone]).  All relevant notes have been reviewed.     Specialty medication(s) and dose(s) confirmed: Regimen is correct and unchanged.   Changes to medications: Ran reports no changes at this time.  Changes to insurance: No  New side effects reported not previously addressed with a pharmacist or physician: None reported  Questions for the pharmacist: No    Confirmed patient received a Conservation officer, historic buildings and a Surveyor, mining with first shipment. The patient will receive a drug information handout for each medication shipped and additional FDA Medication Guides as required.       DISEASE/MEDICATION-SPECIFIC INFORMATION        N/A    SPECIALTY MEDICATION ADHERENCE     Medication Adherence    Patient reported X missed doses in the last month: 0  Specialty Medication: TUKYSA 50 mg tablet  Patient is on additional specialty medications: Yes  Additional Specialty Medications: TUKYSA 150 mg tablet   Patient Reported Additional Medication X Missed Doses in the Last Month: 0  Patient is on more than two specialty medications: No              Were doses missed due to medication being on hold? No    Tukysa 50 mg: 7 days of medicine on hand   Tukysa 150 mg: 7 days of medicine on hand        REFERRAL TO PHARMACIST     Referral to the pharmacist: Not needed      [facility]     Shipping address confirmed in Epic.       Delivery Scheduled: Yes, Expected medication delivery date: 03/29/23.     Medication will be delivered via Same Day Courier to the prescription address in Epic [place].    Willette Pa   [facility] Special[facility] and Home Delivery Pharmacy  Specialty Technician

## 2023-03-28 DIAGNOSIS — C20 Malignant neoplasm of rectum: Principal | ICD-10-CM

## 2023-03-29 MED FILL — TUKYSA 150 MG TABLET: ORAL | 30 days supply | Qty: 60 | Fill #1

## 2023-03-29 MED FILL — TUKYSA 50 MG TABLET: ORAL | 30 days supply | Qty: 120 | Fill #1

## 2023-03-30 DIAGNOSIS — C20 Malignant neoplasm of rectum: Principal | ICD-10-CM

## 2023-03-31 DIAGNOSIS — C20 Malignant neoplasm of rectum: Principal | ICD-10-CM

## 2023-04-01 DIAGNOSIS — C20 Malignant neoplasm of rectum: Principal | ICD-10-CM

## 2023-04-07 ENCOUNTER — Encounter: Admit: 2023-04-07 | Discharge: 2023-04-08 | Payer: MEDICARE

## 2023-04-07 ENCOUNTER — Ambulatory Visit: Admit: 2023-04-07 | Discharge: 2023-04-08 | Payer: MEDICARE

## 2023-04-07 DIAGNOSIS — C20 Malignant neoplasm of rectum: Principal | ICD-10-CM

## 2023-04-07 LAB — COMPREHENSIVE METABOLIC PANEL
ALBUMIN: 3.8 g/dL (ref 3.4–5.0)
ALKALINE PHOSPHATASE: 97 U/L (ref 46–116)
ALT (SGPT): 25 U/L (ref 10–49)
ANION GAP: 13 mmol/L (ref 5–14)
AST (SGOT): 30 U/L (ref <=34)
BILIRUBIN TOTAL: 0.8 mg/dL (ref 0.3–1.2)
BLOOD UREA NITROGEN: 20 mg/dL (ref 9–23)
BUN / CREAT RATIO: 19
CALCIUM: 9 mg/dL (ref 8.7–10.4)
CHLORIDE: 103 mmol/L (ref 98–107)
CO2: 26.5 mmol/L (ref 20.0–31.0)
CREATININE: 1.07 mg/dL (ref 0.73–1.18)
EGFR CKD-EPI (2021) MALE: 83 mL/min/{1.73_m2} (ref >=60)
GLUCOSE RANDOM: 148 mg/dL (ref 70–179)
POTASSIUM: 4.4 mmol/L (ref 3.4–4.8)
PROTEIN TOTAL: 6.8 g/dL (ref 5.7–8.2)
SODIUM: 142 mmol/L (ref 135–145)

## 2023-04-07 LAB — CBC W/ AUTO DIFF
BASOPHILS ABSOLUTE COUNT: 0 10*9/L (ref 0.0–0.1)
BASOPHILS RELATIVE PERCENT: 0.4 %
EOSINOPHILS ABSOLUTE COUNT: 0.1 10*9/L (ref 0.0–0.5)
EOSINOPHILS RELATIVE PERCENT: 2.6 %
HEMATOCRIT: 43 % (ref 39.0–48.0)
HEMOGLOBIN: 14.5 g/dL (ref 12.9–16.5)
LYMPHOCYTES ABSOLUTE COUNT: 1.6 10*9/L (ref 1.1–3.6)
LYMPHOCYTES RELATIVE PERCENT: 36.1 %
MEAN CORPUSCULAR HEMOGLOBIN CONC: 33.6 g/dL (ref 32.0–36.0)
MEAN CORPUSCULAR HEMOGLOBIN: 29.9 pg (ref 25.9–32.4)
MEAN CORPUSCULAR VOLUME: 88.9 fL (ref 77.6–95.7)
MEAN PLATELET VOLUME: 9.7 fL (ref 6.8–10.7)
MONOCYTES ABSOLUTE COUNT: 0.3 10*9/L (ref 0.3–0.8)
MONOCYTES RELATIVE PERCENT: 7.2 %
NEUTROPHILS ABSOLUTE COUNT: 2.4 10*9/L (ref 1.8–7.8)
NEUTROPHILS RELATIVE PERCENT: 53.7 %
NUCLEATED RED BLOOD CELLS: 0 /100{WBCs} (ref <=4)
PLATELET COUNT: 161 10*9/L (ref 150–450)
RED BLOOD CELL COUNT: 4.84 10*12/L (ref 4.26–5.60)
RED CELL DISTRIBUTION WIDTH: 13.9 % (ref 12.2–15.2)
WBC ADJUSTED: 4.5 10*9/L (ref 3.6–11.2)

## 2023-04-07 LAB — CEA: CARCINOEMBRYONIC ANTIGEN: <2 ng/mL (ref 0.0–5.0)

## 2023-04-07 MED ADMIN — trastuzumab-qyyp (TRAZIMERA) 600 mg in sodium chloride (NS) 0.9 % 250 mL IVPB: 6 mg/kg | INTRAVENOUS | @ 20:00:00 | Stop: 2023-04-07

## 2023-04-07 MED ADMIN — heparin, porcine (PF) 100 unit/mL injection 500 Units: 500 [IU] | INTRAVENOUS | @ 20:00:00 | Stop: 2023-04-07

## 2023-04-07 NOTE — Unmapped (Signed)
 Clinical Support on 04/07/2023   Component Date Value Ref Range Status    Sodium 04/07/2023 142  135 - 145 mmol/L Final    Potassium 04/07/2023 4.4  3.4 - 4.8 mmol/L Final    Chloride 04/07/2023 103  98 - 107 mmol/L Final    CO2 04/07/2023 26.5  20.0 - 31.0 mmol/L Final    Anion Gap 04/07/2023 13  5 - 14 mmol/L Final    BUN 04/07/2023 20  9 - 23 mg/dL Final    Creatinine 16/11/9602 1.07  0.73 - 1.18 mg/dL Final    BUN/Creatinine Ratio 04/07/2023 19   Final    eGFR CKD-EPI (2021) Male 04/07/2023 83  >=60 mL/min/1.62m2 Final    eGFR calculated with CKD-EPI 2021 equation in accordance with [employer] and [employer] of Nephrology Task Force recommendations.    Glucose 04/07/2023 148  70 - 179 mg/dL Final    Calcium 54/10/8117 9.0  8.7 - 10.4 mg/dL Final    Albumin 14/78/2956 3.8  3.4 - 5.0 g/dL Final    Total Protein 04/07/2023 6.8  5.7 - 8.2 g/dL Final    Total Bilirubin 04/07/2023 0.8  0.3 - 1.2 mg/dL Final    AST 21/30/8657 30  <=34 U/L Final    ALT 04/07/2023 25  10 - 49 U/L Final    Alkaline Phosphatase 04/07/2023 97  46 - 116 U/L Final    WBC 04/07/2023 4.5  3.6 - 11.2 10*9/L Final    RBC 04/07/2023 4.84  4.26 - 5.60 10*12/L Final    HGB 04/07/2023 14.5  12.9 - 16.5 g/dL Final    HCT 84/69/6295 43.0  39.0 - 48.0 % Final    MCV 04/07/2023 88.9  77.6 - 95.7 fL Final    MCH 04/07/2023 29.9  25.9 - 32.4 pg Final    MCHC 04/07/2023 33.6  32.0 - 36.0 g/dL Final    RDW 28/41/3244 13.9  12.2 - 15.2 % Final    MPV 04/07/2023 9.7  6.8 - 10.7 fL Final    Platelet 04/07/2023 161  150 - 450 10*9/L Final    nRBC 04/07/2023 0  <=4 /100 WBCs Final    Neutrophils % 04/07/2023 53.7  % Final    Lymphocytes % 04/07/2023 36.1  % Final    Monocytes % 04/07/2023 7.2  % Final    Eosinophils % 04/07/2023 2.6  % Final    Basophils % 04/07/2023 0.4  % Final    Absolute Neutrophils 04/07/2023 2.4  1.8 - 7.8 10*9/L Final    Absolute Lymphocytes 04/07/2023 1.6  1.1 - 3.6 10*9/L Final    Absolute Monocytes 04/07/2023 0.3 0.3 - 0.8 10*9/L Final    Absolute Eosinophils 04/07/2023 0.1  0.0 - 0.5 10*9/L Final    Absolute Basophils 04/07/2023 0.0  0.0 - 0.1 10*9/L Final

## 2023-04-07 NOTE — Unmapped (Signed)
 Max Stewart arrived in clinic in stable, ambulatory condition at 18. Weight and vital signs recorded. He feels well for treatment today. Port accessed in nurse visit where labs were drawn. No lab parameters or pre medications ordered in plan. Port flushed with brisk blood return, dressing clean/dry/intact. Chemotherapy regimen administered as ordered without complications while in clinic. Port flushed with brisk blood return, heparin locked, and de-accessed, site covered with band aid. Max Stewart directed to check out to complete appointment then discharged home in stable, ambulatory condition.

## 2023-04-12 DIAGNOSIS — C20 Malignant neoplasm of rectum: Principal | ICD-10-CM

## 2023-04-28 ENCOUNTER — Inpatient Hospital Stay: Admit: 2023-04-28 | Discharge: 2023-04-28 | Payer: MEDICARE

## 2023-04-28 ENCOUNTER — Ambulatory Visit: Admit: 2023-04-28 | Discharge: 2023-04-28 | Payer: MEDICARE

## 2023-04-28 ENCOUNTER — Encounter: Admit: 2023-04-28 | Discharge: 2023-04-28 | Payer: MEDICARE

## 2023-04-28 ENCOUNTER — Ambulatory Visit
Admit: 2023-04-28 | Discharge: 2023-04-28 | Payer: MEDICARE | Attending: Hematology & Oncology | Primary: Hematology & Oncology

## 2023-04-28 DIAGNOSIS — C20 Malignant neoplasm of rectum: Principal | ICD-10-CM

## 2023-04-28 LAB — COMPREHENSIVE METABOLIC PANEL
ALBUMIN: 3.5 g/dL (ref 3.4–5.0)
ALKALINE PHOSPHATASE: 133 U/L — ABNORMAL HIGH (ref 46–116)
ALT (SGPT): 123 U/L — ABNORMAL HIGH (ref 10–49)
ANION GAP: 10 mmol/L (ref 5–14)
AST (SGOT): 57 U/L — ABNORMAL HIGH (ref <=34)
BILIRUBIN TOTAL: 0.8 mg/dL (ref 0.3–1.2)
BLOOD UREA NITROGEN: 22 mg/dL (ref 9–23)
BUN / CREAT RATIO: 22
CALCIUM: 8.7 mg/dL (ref 8.7–10.4)
CHLORIDE: 104 mmol/L (ref 98–107)
CO2: 27.3 mmol/L (ref 20.0–31.0)
CREATININE: 1 mg/dL (ref 0.73–1.18)
EGFR CKD-EPI (2021) MALE: 90 mL/min/{1.73_m2} (ref >=60)
GLUCOSE RANDOM: 189 mg/dL — ABNORMAL HIGH (ref 70–179)
POTASSIUM: 4.6 mmol/L (ref 3.4–4.8)
PROTEIN TOTAL: 6.6 g/dL (ref 5.7–8.2)
SODIUM: 141 mmol/L (ref 135–145)

## 2023-04-28 LAB — CBC W/ AUTO DIFF
BASOPHILS ABSOLUTE COUNT: 0 10*9/L (ref 0.0–0.1)
BASOPHILS RELATIVE PERCENT: 0.8 %
EOSINOPHILS ABSOLUTE COUNT: 0.1 10*9/L (ref 0.0–0.5)
EOSINOPHILS RELATIVE PERCENT: 2.7 %
HEMATOCRIT: 42 % (ref 39.0–48.0)
HEMOGLOBIN: 14.4 g/dL (ref 12.9–16.5)
LYMPHOCYTES ABSOLUTE COUNT: 1.4 10*9/L (ref 1.1–3.6)
LYMPHOCYTES RELATIVE PERCENT: 29.6 %
MEAN CORPUSCULAR HEMOGLOBIN CONC: 34.3 g/dL (ref 32.0–36.0)
MEAN CORPUSCULAR HEMOGLOBIN: 30.6 pg (ref 25.9–32.4)
MEAN CORPUSCULAR VOLUME: 89.4 fL (ref 77.6–95.7)
MEAN PLATELET VOLUME: 9.7 fL (ref 6.8–10.7)
MONOCYTES ABSOLUTE COUNT: 0.4 10*9/L (ref 0.3–0.8)
MONOCYTES RELATIVE PERCENT: 7.5 %
NEUTROPHILS ABSOLUTE COUNT: 2.9 10*9/L (ref 1.8–7.8)
NEUTROPHILS RELATIVE PERCENT: 59.4 %
NUCLEATED RED BLOOD CELLS: 0 /100{WBCs} (ref <=4)
PLATELET COUNT: 162 10*9/L (ref 150–450)
RED BLOOD CELL COUNT: 4.69 10*12/L (ref 4.26–5.60)
RED CELL DISTRIBUTION WIDTH: 13.6 % (ref 12.2–15.2)
WBC ADJUSTED: 4.9 10*9/L (ref 3.6–11.2)

## 2023-04-28 LAB — CEA: CARCINOEMBRYONIC ANTIGEN: <2 ng/mL (ref 0.0–5.0)

## 2023-04-28 MED ADMIN — trastuzumab-qyyp (TRAZIMERA) 600 mg in sodium chloride (NS) 0.9 % 250 mL IVPB: 6 mg/kg | INTRAVENOUS | @ 17:00:00 | Stop: 2023-04-28

## 2023-04-28 MED ADMIN — gadopiclenol (ELUCIREM,VUEWAY) injection 10 mL: 10 mL | INTRAVENOUS | @ 14:00:00 | Stop: 2023-04-28

## 2023-04-28 NOTE — Unmapped (Signed)
 Patient arrived in the infusion clinic. Weight and Vitals were obtained. Port was accessed during nurse visit, flushed with blood return, dressing clean, dry, and intact. Labs were drawn and resulted. Patient was given trastuzumab as ordered without complications while in the clinic. Port was flushed with blood return, heparin locked, then de-accessed area covered with 2x2 gauze and ban-aid. Patient then discharged home to self care.

## 2023-04-28 NOTE — Unmapped (Signed)
 [facility]     CONSULTING PROVIDERS  Dr. Rayetta Humphrey - Radiation Oncology  Dr. Rico Junker, [facility]   Dr. Pearlean Brownie, [facility] Surgical Oncology  ____________________________________________________________________    CANCER HISTORY  Diagnosis 10/12/17 metastatic Rectal Adenocarcinoma with synchronous liver mets, work-up with a 4cm rectal mass and 5 metastatic hepatic lesions    11/2017-03/2018. FOLFOX x 8  05/2018 chemoradiotherapy to primary.   -Clinical complete response in liver and rectum, managed with Watch and Wait approach  03/2019 recurrent disease in rectum   05/2019 proctectomy with coloanal anastomosis, liver wedge resection (seg 3): liver with fibrosis, rectum invasive mod to poorly differentiated adenocarcinoma, 3 cm; into muscularis propria, no LVI, tumor at distal margin in area of tearing superceded by additional specimen, 0/16 nodes, two with prominent treatment effect. YpT2N0.  - complicated by abdominal abscess with MRSA  08/02/19 closure of diverting ileostomy  02/2020 2 sites of recurrence disease in liver adjacent to GB fossa  04/14/20 seg 4b wedge resection: two foci of adenocarcinoma c/w colorectal primary. Margin +; node negative   01/2021 SBRT 5000cGy in 30fx to met near GB fossa  06/2021 -09/2021 pelvic + possible right abd wall recurrence FOLFOX x 6  11/05/2021 tucatinib-trastuzumab start [baseline echo nl]  - c1 LFTs 4x ULN. Improved quickly, resumed at tucatinib 250 bid    MOLECULAR PROFILE   TEMPUS from 06/2021 rectal mass biopsy:  HER2 amplified  TP53 and APC mutation  RAS/RAF WT  TMB 6.3, MSS    Treatment plan:  tucatinib + trastuzumab.      ___________________________________________________________________    ASSESSMENT  1. 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 Stable    2. Type I DM- controlled on insulin pump, no complications from DM  3. Elevated transaminases, improved from prior, CTM  4. ECOG 0    RECOMMENDATIONS  1. Tucatinib 250 mg BID + trastuzumab   2. RTC  6 weeks visit.     Assessment & Plan  Metastatic HER2 amplified rectal adenocarcinoma    He has metastatic HER2 positive colorectal cancer with recent imaging indicating potential progressive disease in the liver and pelvis-- reviewed with multidisciplinary tumor board and group agreed disease rather stable.  - Continue current treatment with Tucatinib and Trastuzumab until further notice      Elevated transaminases    Liver function tests show slight elevation in transaminases, possibly related to changes in liver perfusion due to previous surgeries and radiation. He denies significant alcohol consumption that could account for the elevation. Perfusion changes may be causing liver irritation.    - Monitor liver function tests closely      Type I Diabetes Mellitus    He has Type I Diabetes Mellitus, managed with a Dexcom and Omnipod system. There are no acute issues related to diabetes management.    Medical decision making based high complexity of cancer and treatment risk of complications and monitoring and management of toxicity of treatment.     ______________________________________________________________    Interval History  Max Stewart returns today for ongoing management of his rectal cancer with liver mets.    History of Present Illness  Max Stewart is a 53 year old male with metastatic HER2 positive colorectal cancer who presents with a rescanning scan showing concerning sites for progressive disease in the liver and pelvis.    He has metastatic HER2 positive colorectal cancer with recent rescanning showing concerning sites for potential progressive disease in  the liver and pelvis. All quite subtle change.    He experiences issues with bowel movements, including a significant amount of blood in the stool this week. He did not use the bathroom for three to four days prior, which he believes may have contributed to the issue. He describes the blood as 'more of a red, like fresher' and mentions that he sometimes still gets mucus and dry blood. The bleeding episode lasted for a day or two and has since returned to what he considers normal.    He is currently on tucatinib and trastuzumab for his cancer treatment. He is concerned about not receiving a message for his medication refill, which is typically sent through his chart. He has about five days left of the 150 mg pills and has smaller pills available to maintain his dosage if needed.    His liver function tests are slightly elevated, though he denies any recent alcohol consumption that could account for this change. He recalls having one drink at dinner last Thursday but does not believe it contributed to the LFT changes.      MEDICAL HISTORY  1. Type I DM- diagnosed at age 62, on insulin     SOCIAL HISTORY   Denies smoking and drugs. Drinks occasional alcohol. 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 He lives with his wife and two daughters     PHYSICAL EXAM  Vitals:    04/28/23 1036   BP: 107/74   Pulse: 58   Resp: 18   Temp: 36.4 ??C (97.5 ??F)   SpO2: 98%   Looks great, no edema. Exam deferred    OBJECTIVE DATA  Reviewed in Epic , CBC, CMP reviewed, look good     RADIOLOGY RESULTS   MRI personally reviewed by me: stable to slight increase in peritoneal nodule, pelvic soft tissue. Reveiwed at tumor board: stable.

## 2023-04-29 DIAGNOSIS — C20 Malignant neoplasm of rectum: Principal | ICD-10-CM

## 2023-05-02 DIAGNOSIS — C20 Malignant neoplasm of rectum: Principal | ICD-10-CM

## 2023-05-02 NOTE — Unmapped (Signed)
 [facility] Delivery Pharm[facility] Clinical Assessment & Refill Coordination Note    Max Stewart, DOB: December 01, 1970  Phone: [phone] (home) [phone] (work)    All above HIPAA information was verified with patient.     Was a Nurse, learning disability used for this call? No    Specialty Medication(s):   Hematology/Oncology: Tukysa 150 mg and 50 mg     Current Outpatient Medications   Medication Sig Dispense Refill    citalopram (CELEXA) 40 MG tablet Take 1 tablet (40 mg total) by mouth daily.      insulin ASPART (NOVOLOG) 100 unit/mL injection Inject 1.2-1.4 mL (120-140 Units total) under the skin daily before breakfast.      loperamide (IMODIUM) 2 mg capsule TAKE 2 CAPSULES (4 MG) BY MOUTH FOR FIRST LOOSE STOOL FOLLOWED BY 1 CAPSULE (2 MG) AFTER EACH LOOSE STOOL THEREAFTER (MAXIMUM OF 8 CAPSULES PER DAY). 360 capsule 3    loratadine (CLARITIN) 10 mg tablet Take 1 tablet (10 mg total) by mouth daily as needed for allergies.      losartan (COZAAR) 50 MG tablet Take 1 tablet (50 mg total) by mouth daily before breakfast.      rosuvastatin (CRESTOR) 20 MG tablet Take 1 tablet (20 mg total) by mouth daily.      sildenafiL (VIAGRA) 25 MG tablet Take 1 tablet (25 mg total) by mouth daily as needed for erectile dysfunction. If 1 tablet (25 mg) is ineffective, may take up to maximum of 2 tablets (50 mg) by mouth daily as needed. 60 tablet 5    tucatinib (TUKYSA) 150 mg tablet Take 1 tablet (150 mg total) by mouth two (2) times a day with 1 other tucatinib prescription for 250 mg total. Take with or without a meal. 60 tablet 6    tucatinib (TUKYSA) 50 mg tablet Take 2 tablets (100 mg total) by mouth two (2) times a day with 1 other tucatinib prescription for 250 mg total. Take with or without a meal. 120 tablet 6     No current facility-administered medications for this visit.        Changes to medications: Max Stewart reports no changes at this time.    Medication list has been reviewed and updated in Epic: Yes    Allergies Allergen Reactions    Ciprofloxacin      Liver Disorder       Shellfish Containing Products Hives       Changes to allergies: No    Allergies have been reviewed and updated in Epic: Yes    SPECIALTY MEDICATION ADHERENCE     Tukysa 150 mg: 2-3 days of medicine on hand   Tukysa 50 mg: 2-3 days of medicine on hand     Medication Adherence    Patient reported X missed doses in the last month: 0  Specialty Medication: Tukysa 150 mg and 50 mg tablets - 250 mg twice daily  Patient is on additional specialty medications: No  Informant: patient  Confirmed plan for next specialty medication refill: delivery by pharmacy  Refills needed for supportive medications: not needed          Are there any concerns with adherence? No    Adherence counseling provided? Not needed    Patient-Reported Symptoms Tracker for Cancer Patients on Oral Chemotherapy     Oral chemotherapy medication name(s): Tukysa  Dose and frequency: 250 mg twice daily  Oral Chemotherapy Start Date:    Baseline? No  Clinic(s) visited: Gastrointestinal  Were you  able to reach the patient on a call today? Yes    Symptom Grouping Question Patient Response   Digestion and Eating Have you felt sick to your stomach? Denies    Had diarrhea? Denies    Constipated? Denies    Not wanting to eat? Denies    Comments      Sleep and Pain Felt very tired even after you rest? Denies    Pain due to cancer medication or cancer? Denies    Comments     Other Side Effects Numbness or tingling in hands and/or feet? Denies    Felt short of breath? Denies    Mouth or throat Sores? Denies    Rash? Denies    Palmar-plantar erythrodysesthesia syndrome?      Rash - acneiform?      Rash - maculo-papular?      How many days over the past month did your cancer medication or cancer keep you from your normal activities?  Write in number of days, 0-30:  0    Other side effects or things you would like to discuss?      Comments?     Adherence  In the last 30 days, on how many days did you miss at least one dose of any of your [drug name]? Write in number of days, 0-30:  0    What reasons are you having trouble taking your medication [pharmacist: check all that apply]? Specify chemotherapy cycle:        No problems identified    Comments:        Comments       Optional Symptom Tracking  New start - hematology ([facility]    Comments:      Specialty Pharmacist Interventions:     Question Patient Response    What actions did you take in response to the patient's PRO answers? No action required   Other (Specify):          CLINICAL MANAGEMENT AND INTERVENTION      Clinical Benefit Assessment:    Do you feel the medicine is effective or helping your condition? Yes    Clinical Benefit counseling provided? Not needed    Acute Infection Status:    Acute infections noted within Epic:  No active infections    Patient reported infection: None    Therapy Appropriateness:    Is therapy appropriate based on current medication list, adverse reactions, adherence, clinical benefit and progress toward achieving therapeutic goals?  Yes, therapy is appropriate and should be continued    DISEASE/MEDICATION-SPECIFIC INFORMATION      N/A    Is the patient receiving adequate infection prevention treatment? Not applicable    Does the patient have adequate nutritional support? Not applicable    PATIENT SPECIFIC NEEDS     Does the patient have any physical, cognitive, or cultural barriers? No    Is the patient high risk? No    Did the patient require a clinical intervention? No    Does the patient require physician intervention or other additional services (i.e., nutrition, smoking cessation, social work)? No    Does the patient have an additional or emergency contact listed in their chart? Yes    SOCIAL DETERMINANTS OF HEALTH     At the [facility], we have learned that life circumstances - like trouble affording food, housing, utilities, or transportation can affect the health of many of our patients. That is why we wanted to ask:  are you currently experiencing any life circumstances that are negatively impacting your health and/or quality of life? Patient declined to answer    Social Drivers of Health     Food Insecurity: No Food Insecurity (06/30/2021)    Hunger Vital Sign     Worried About Running Out of Food in the Last Year: Never true     Ran Out of Food in the Last Year: Never true   Tobacco Use: Low Risk  (04/28/2023)    Patient History     Smoking Tobacco Use: Never     Smokeless Tobacco Use: Never     Passive Exposure: Not on file   Transportation Needs: No Transportation Needs (06/30/2021)    PRAPARE - Transportation     Lack of Transportation (Medical): No     Lack of Transportation (Non-Medical): No   Alcohol Use: Not on file   Housing: Not on file   Physical Activity: Not on file   Utilities: Not on file   Stress: Not on file   Interpersonal Safety: Not on file   Substance Use: Not on file (12/14/2022)   Intimate Partner Violence: Not on file   Social Connections: Not on file   Financial Resource Strain: Low Risk  (06/30/2021)    Overall Financial Resource Strain (CARDIA)     Difficulty of Paying Living Expenses: Not very hard   Depression: Not at risk (10/02/2018)    Received from [facility]    PHQ-2     (OBSOLETE) Total Score =: 0   Internet Connectivity: No Internet connectivity concern identified (06/30/2021)    Internet Connectivity     Do you have access to internet services: Yes     How do you connect to the internet: Personal Device at home     Is your internet connection strong enough for you to watch video on your device without major problems?: Yes     Do you have enough data to get through the month?: Yes     Does at least one of the devices have a camera that you can use for video chat?: Yes   Health Literacy: Low Risk  (06/30/2021)    Health Literacy     : Never       Would you be willing to receive help with any of the needs that you have identified today? Not applicable       SHIPPING     Specialty Medication(s) to be Shipped:   Hematology/Oncology: Tukysa 150 mg and 50 mg    Other medication(s) to be shipped: No additional medications requested for fill at this time     Changes to insurance: No    Delivery Scheduled: Yes, Expected medication delivery date: 05/04/23.     Medication will be delivered via Next Day Courier to the confirmed prescription address in [facility].    The patient will receive a drug information handout for each medication shipped and additional FDA Medication Guides as required.  Verified that patient has previously received a Conservation officer, historic buildings and a Surveyor, mining.    The patient or caregiver noted above participated in the development of this care plan and knows that they can request review of or adjustments to the care plan at any time.      All of the patient's questions and concerns have been addressed.    Kermit Balo, [facility]   [facility]  Pharmacist

## 2023-05-03 MED FILL — TUKYSA 150 MG TABLET: ORAL | 30 days supply | Qty: 60 | Fill #2

## 2023-05-03 MED FILL — TUKYSA 50 MG TABLET: ORAL | 30 days supply | Qty: 120 | Fill #2

## 2023-05-19 ENCOUNTER — Ambulatory Visit: Admit: 2023-05-19 | Discharge: 2023-05-19

## 2023-05-19 ENCOUNTER — Encounter: Admit: 2023-05-19 | Discharge: 2023-05-19

## 2023-05-19 DIAGNOSIS — C20 Malignant neoplasm of rectum: Principal | ICD-10-CM

## 2023-05-19 LAB — CBC W/ AUTO DIFF
BASOPHILS ABSOLUTE COUNT: 0 10*9/L (ref 0.0–0.1)
BASOPHILS RELATIVE PERCENT: 0.6 %
EOSINOPHILS ABSOLUTE COUNT: 0.1 10*9/L (ref 0.0–0.5)
EOSINOPHILS RELATIVE PERCENT: 1.8 %
HEMATOCRIT: 40.6 % (ref 39.0–48.0)
HEMOGLOBIN: 13.8 g/dL (ref 12.9–16.5)
LYMPHOCYTES ABSOLUTE COUNT: 1.7 10*9/L (ref 1.1–3.6)
LYMPHOCYTES RELATIVE PERCENT: 34.9 %
MEAN CORPUSCULAR HEMOGLOBIN CONC: 34.1 g/dL (ref 32.0–36.0)
MEAN CORPUSCULAR HEMOGLOBIN: 30 pg (ref 25.9–32.4)
MEAN CORPUSCULAR VOLUME: 88.2 fL (ref 77.6–95.7)
MEAN PLATELET VOLUME: 9.8 fL (ref 6.8–10.7)
MONOCYTES ABSOLUTE COUNT: 0.4 10*9/L (ref 0.3–0.8)
MONOCYTES RELATIVE PERCENT: 8 %
NEUTROPHILS ABSOLUTE COUNT: 2.6 10*9/L (ref 1.8–7.8)
NEUTROPHILS RELATIVE PERCENT: 54.7 %
NUCLEATED RED BLOOD CELLS: 0 /100{WBCs} (ref <=4)
PLATELET COUNT: 143 10*9/L — ABNORMAL LOW (ref 150–450)
RED BLOOD CELL COUNT: 4.6 10*12/L (ref 4.26–5.60)
RED CELL DISTRIBUTION WIDTH: 13.2 % (ref 12.2–15.2)
WBC ADJUSTED: 4.8 10*9/L (ref 3.6–11.2)

## 2023-05-19 LAB — COMPREHENSIVE METABOLIC PANEL
ALBUMIN: 3.5 g/dL (ref 3.4–5.0)
ALKALINE PHOSPHATASE: 114 U/L (ref 46–116)
ALT (SGPT): 47 U/L (ref 10–49)
ANION GAP: 11 mmol/L (ref 5–14)
AST (SGOT): 38 U/L — ABNORMAL HIGH (ref <=34)
BILIRUBIN TOTAL: 0.9 mg/dL (ref 0.3–1.2)
BLOOD UREA NITROGEN: 21 mg/dL (ref 9–23)
BUN / CREAT RATIO: 21
CALCIUM: 8.8 mg/dL (ref 8.7–10.4)
CHLORIDE: 104 mmol/L (ref 98–107)
CO2: 27.3 mmol/L (ref 20.0–31.0)
CREATININE: 1 mg/dL (ref 0.73–1.18)
EGFR CKD-EPI (2021) MALE: 90 mL/min/1.73m2 (ref >=60)
GLUCOSE RANDOM: 133 mg/dL (ref 70–179)
POTASSIUM: 4 mmol/L (ref 3.4–4.8)
PROTEIN TOTAL: 6.4 g/dL (ref 5.7–8.2)
SODIUM: 142 mmol/L (ref 135–145)

## 2023-05-19 LAB — CEA: CARCINOEMBRYONIC ANTIGEN: <2 ng/mL (ref 0.0–5.0)

## 2023-05-19 MED ADMIN — trastuzumab-qyyp (TRAZIMERA) 600 mg in sodium chloride (NS) 0.9 % 250 mL IVPB: 6 mg/kg | INTRAVENOUS | @ 18:00:00 | Stop: 2023-05-19

## 2023-05-19 MED ADMIN — heparin, porcine (PF) 100 unit/mL injection 500 Units: 500 [IU] | INTRAVENOUS | @ 19:00:00 | Stop: 2023-05-19

## 2023-05-19 NOTE — Unmapped (Signed)
 Clinical Support on 05/19/2023   Component Date Value Ref Range Status    Sodium 05/19/2023 142  135 - 145 mmol/L Final    Potassium 05/19/2023 4.0  3.4 - 4.8 mmol/L Final    Chloride 05/19/2023 104  98 - 107 mmol/L Final    CO2 05/19/2023 27.3  20.0 - 31.0 mmol/L Final    Anion Gap 05/19/2023 11  5 - 14 mmol/L Final    BUN 05/19/2023 21  9 - 23 mg/dL Final    Creatinine 54/10/8117 1.00  0.73 - 1.18 mg/dL Final    BUN/Creatinine Ratio 05/19/2023 21   Final    eGFR CKD-EPI (2021) Male 05/19/2023 90  >=60 mL/min/1.3m2 Final    eGFR calculated with CKD-EPI 2021 equation in accordance with [employer] and [employer] of Nephrology Task Force recommendations.    Glucose 05/19/2023 133  70 - 179 mg/dL Final    Calcium 14/78/2956 8.8  8.7 - 10.4 mg/dL Final    Albumin 21/30/8657 3.5  3.4 - 5.0 g/dL Final    Total Protein 05/19/2023 6.4  5.7 - 8.2 g/dL Final    Total Bilirubin 05/19/2023 0.9  0.3 - 1.2 mg/dL Final    AST 84/69/6295 38 (H)  <=34 U/L Final    ALT 05/19/2023 47  10 - 49 U/L Final    Alkaline Phosphatase 05/19/2023 114  46 - 116 U/L Final    WBC 05/19/2023 4.8  3.6 - 11.2 10*9/L Final    RBC 05/19/2023 4.60  4.26 - 5.60 10*12/L Final    HGB 05/19/2023 13.8  12.9 - 16.5 g/dL Final    HCT 28/41/3244 40.6  39.0 - 48.0 % Final    MCV 05/19/2023 88.2  77.6 - 95.7 fL Final    MCH 05/19/2023 30.0  25.9 - 32.4 pg Final    MCHC 05/19/2023 34.1  32.0 - 36.0 g/dL Final    RDW 02/10/7251 13.2  12.2 - 15.2 % Final    MPV 05/19/2023 9.8  6.8 - 10.7 fL Final    Platelet 05/19/2023 143 (L)  150 - 450 10*9/L Final    nRBC 05/19/2023 0  <=4 /100 WBCs Final    Neutrophils % 05/19/2023 54.7  % Final    Lymphocytes % 05/19/2023 34.9  % Final    Monocytes % 05/19/2023 8.0  % Final    Eosinophils % 05/19/2023 1.8  % Final    Basophils % 05/19/2023 0.6  % Final    Absolute Neutrophils 05/19/2023 2.6  1.8 - 7.8 10*9/L Final    Absolute Lymphocytes 05/19/2023 1.7  1.1 - 3.6 10*9/L Final    Absolute Monocytes 05/19/2023 0.4  0.3 - 0.8 10*9/L Final    Absolute Eosinophils 05/19/2023 0.1  0.0 - 0.5 10*9/L Final    Absolute Basophils 05/19/2023 0.0  0.0 - 0.1 10*9/L Final

## 2023-05-19 NOTE — Unmapped (Signed)
 Mr Brien Can arrived in clinic in stable, ambulatory condition at 53. Vital signs recorded. He feels well for treatment today. Port accessed in nurse visit where labs were drawn. No lab parameters in treatment plan, nor pre medications. Port flushed with brisk blood return, chemotherapy regimen administered per orders without complications. Port flushed with brisk blood return, heparin locked, de-accessed, site covered with band aid. Mr Brien Can directed to check out to complete visit and confirm upcoming appointments, then discharged home with self care.

## 2023-05-27 NOTE — Unmapped (Signed)
 [facility] Special[facility] and Home Delivery Pharmacy Refill Coordination Note    Max Stewart, DOB: May 06, 1970  Phone: [phone] (home) [phone] (work)      All above HIPAA information was verified with patient.         05/27/2023     1:02 PM   Specialty Rx Medication Refill Questionnaire   Which Medications would you like refilled and shipped? Tukysa    Please list all current allergies: Shellfish cipro   Have you missed any doses in the last 30 days? No   Have you had any changes to your medication(s) since your last refill? No   How many days remaining of each medication do you have at home? 7   Have you experienced any side effects in the last 30 days? No   Please enter the full address (street address, city, state, zip code) where you would like your medication(s) to be delivered to. [address], [place] [address]   Please specify on which day you would like your medication(s) to arrive. Note: if you need your medication(s) within 3 days, please call the pharmacy to schedule your order at [phone]  06/01/2023   Has your insurance changed since your last refill? No   Would you like a pharmacist to call you to discuss your medication(s)? No   Do you require a signature for your package? (Note: if we are billing Medicare Part B or your order contains a controlled substance, we will require a signature) No   I have been provided my out of pocket cost for my medication and approve the pharmacy to charge the amount to my credit card on file. Yes         Completed refill call assessment today to schedule patient's medication shipment from the [facility] Delivery [facility] [phone]).  All relevant notes have been reviewed.       Confirmed patient received a Conservation officer, historic buildings and a Surveyor, mining with first shipment. The patient will receive a drug information handout for each medication shipped and additional FDA Medication Guides as required.         REFERRAL TO PHARMACIST Referral to the pharmacist: Not needed      [facility]     Shipping address confirmed in Epic.     Delivery Scheduled: Yes, Expected medication delivery date: 06/01/23.     Medication will be delivered via Next Day Courier to the prescription address in Epic [place].    Micala Saltsman M Santer Torres   [facility] Specialty Technician

## 2023-05-31 MED FILL — TUKYSA 150 MG TABLET: ORAL | 30 days supply | Qty: 60 | Fill #3

## 2023-05-31 MED FILL — TUKYSA 50 MG TABLET: ORAL | 30 days supply | Qty: 120 | Fill #3

## 2023-06-09 ENCOUNTER — Encounter: Admit: 2023-06-09 | Discharge: 2023-06-10 | Payer: MEDICARE

## 2023-06-09 ENCOUNTER — Ambulatory Visit: Admit: 2023-06-09 | Discharge: 2023-06-10 | Payer: MEDICARE

## 2023-06-09 DIAGNOSIS — C20 Malignant neoplasm of rectum: Principal | ICD-10-CM

## 2023-06-09 LAB — CBC W/ AUTO DIFF
BASOPHILS ABSOLUTE COUNT: 0.1 10*9/L (ref 0.0–0.1)
BASOPHILS RELATIVE PERCENT: 1.2 %
EOSINOPHILS ABSOLUTE COUNT: 0.1 10*9/L (ref 0.0–0.5)
EOSINOPHILS RELATIVE PERCENT: 2.2 %
HEMATOCRIT: 39.6 % (ref 39.0–48.0)
HEMOGLOBIN: 14 g/dL (ref 12.9–16.5)
LYMPHOCYTES ABSOLUTE COUNT: 1.7 10*9/L (ref 1.1–3.6)
LYMPHOCYTES RELATIVE PERCENT: 29.4 %
MEAN CORPUSCULAR HEMOGLOBIN CONC: 35.3 g/dL (ref 32.0–36.0)
MEAN CORPUSCULAR HEMOGLOBIN: 30.6 pg (ref 25.9–32.4)
MEAN CORPUSCULAR VOLUME: 86.9 fL (ref 77.6–95.7)
MEAN PLATELET VOLUME: 9.9 fL (ref 6.8–10.7)
MONOCYTES ABSOLUTE COUNT: 0.4 10*9/L (ref 0.3–0.8)
MONOCYTES RELATIVE PERCENT: 7.4 %
NEUTROPHILS ABSOLUTE COUNT: 3.5 10*9/L (ref 1.8–7.8)
NEUTROPHILS RELATIVE PERCENT: 59.8 %
NUCLEATED RED BLOOD CELLS: 0 /100{WBCs} (ref <=4)
PLATELET COUNT: 165 10*9/L (ref 150–450)
RED BLOOD CELL COUNT: 4.56 10*12/L (ref 4.26–5.60)
RED CELL DISTRIBUTION WIDTH: 12.9 % (ref 12.2–15.2)
WBC ADJUSTED: 5.8 10*9/L (ref 3.6–11.2)

## 2023-06-09 LAB — COMPREHENSIVE METABOLIC PANEL
ALBUMIN: 3.5 g/dL (ref 3.4–5.0)
ALKALINE PHOSPHATASE: 106 U/L (ref 46–116)
ALT (SGPT): 38 U/L (ref 10–49)
ANION GAP: 11 mmol/L (ref 5–14)
AST (SGOT): 31 U/L (ref <=34)
BILIRUBIN TOTAL: 0.5 mg/dL (ref 0.3–1.2)
BLOOD UREA NITROGEN: 20 mg/dL (ref 9–23)
BUN / CREAT RATIO: 20
CALCIUM: 8.9 mg/dL (ref 8.7–10.4)
CHLORIDE: 103 mmol/L (ref 98–107)
CO2: 27.8 mmol/L (ref 20.0–31.0)
CREATININE: 0.98 mg/dL (ref 0.73–1.18)
EGFR CKD-EPI (2021) MALE: >90 mL/min/1.73m2 (ref >=60)
GLUCOSE RANDOM: 231 mg/dL — ABNORMAL HIGH (ref 70–179)
POTASSIUM: 4.3 mmol/L (ref 3.4–4.8)
PROTEIN TOTAL: 6.4 g/dL (ref 5.7–8.2)
SODIUM: 142 mmol/L (ref 135–145)

## 2023-06-09 LAB — CEA: CARCINOEMBRYONIC ANTIGEN: 2.1 ng/mL (ref 0.0–5.0)

## 2023-06-09 MED ADMIN — heparin, porcine (PF) 100 unit/mL injection 500 Units: 500 [IU] | INTRAVENOUS | @ 19:00:00 | Stop: 2023-06-09

## 2023-06-09 MED ADMIN — trastuzumab-qyyp (TRAZIMERA) 600 mg in sodium chloride (NS) 0.9 % 250 mL IVPB: 6 mg/kg | INTRAVENOUS | @ 19:00:00 | Stop: 2023-06-09

## 2023-06-09 NOTE — Unmapped (Signed)
 Clinical Support on 06/09/2023   Component Date Value Ref Range Status    Sodium 06/09/2023 142  135 - 145 mmol/L Final    Potassium 06/09/2023 4.3  3.4 - 4.8 mmol/L Final    Chloride 06/09/2023 103  98 - 107 mmol/L Final    CO2 06/09/2023 27.8  20.0 - 31.0 mmol/L Final    Anion Gap 06/09/2023 11  5 - 14 mmol/L Final    BUN 06/09/2023 20  9 - 23 mg/dL Final    Creatinine 56/21/3086 0.98  0.73 - 1.18 mg/dL Final    BUN/Creatinine Ratio 06/09/2023 20   Final    eGFR CKD-EPI (2021) Male 06/09/2023 >90  >=60 mL/min/1.48m2 Final    eGFR calculated with CKD-EPI 2021 equation in accordance with [employer] and [employer] of Nephrology Task Force recommendations.    Glucose 06/09/2023 231 (H)  70 - 179 mg/dL Final    Calcium 57/84/6962 8.9  8.7 - 10.4 mg/dL Final    Albumin 95/28/4132 3.5  3.4 - 5.0 g/dL Final    Total Protein 06/09/2023 6.4  5.7 - 8.2 g/dL Final    Total Bilirubin 06/09/2023 0.5  0.3 - 1.2 mg/dL Final    AST 44/02/270 31  <=34 U/L Final    ALT 06/09/2023 38  10 - 49 U/L Final    Alkaline Phosphatase 06/09/2023 106  46 - 116 U/L Final    WBC 06/09/2023 5.8  3.6 - 11.2 10*9/L Final    RBC 06/09/2023 4.56  4.26 - 5.60 10*12/L Final    HGB 06/09/2023 14.0  12.9 - 16.5 g/dL Final    HCT 53/66/4403 39.6  39.0 - 48.0 % Final    MCV 06/09/2023 86.9  77.6 - 95.7 fL Final    MCH 06/09/2023 30.6  25.9 - 32.4 pg Final    MCHC 06/09/2023 35.3  32.0 - 36.0 g/dL Final    RDW 47/42/5956 12.9  12.2 - 15.2 % Final    MPV 06/09/2023 9.9  6.8 - 10.7 fL Final    Platelet 06/09/2023 165  150 - 450 10*9/L Final    nRBC 06/09/2023 0  <=4 /100 WBCs Final    Neutrophils % 06/09/2023 59.8  % Final    Lymphocytes % 06/09/2023 29.4  % Final    Monocytes % 06/09/2023 7.4  % Final    Eosinophils % 06/09/2023 2.2  % Final    Basophils % 06/09/2023 1.2  % Final    Absolute Neutrophils 06/09/2023 3.5  1.8 - 7.8 10*9/L Final    Absolute Lymphocytes 06/09/2023 1.7  1.1 - 3.6 10*9/L Final    Absolute Monocytes 06/09/2023 0.4  0.3 - 0.8 10*9/L Final    Absolute Eosinophils 06/09/2023 0.1  0.0 - 0.5 10*9/L Final    Absolute Basophils 06/09/2023 0.1  0.0 - 0.1 10*9/L Final

## 2023-06-09 NOTE — Unmapped (Signed)
 Patient arrived in the infusion clinic. Weight and Vitals were obtained. Port was accessed during nurse visit, flushed with blood return, dressing clean, dry, and intact. Labs were drawn and resulted. Patient was given trastuzumab as ordered without complications while in the clinic. Port was flushed with blood return, heparin locked, then de-accessed area covered with 2x2 gauze and ban-aid. Patient then discharged home to self care.

## 2023-06-23 NOTE — Unmapped (Signed)
 [facility] Specialty and Home Delivery Pharmacy Refill Coordination Note    Specialty Medication(s) to be Shipped:   Hematology/Oncology: Tukysa 150mg  and tukysa 50mg     Other medication(s) to be shipped: No additional medications requested for fill at this time     Max Stewart, DOB: March 03, 1970  Phone: [phone] (home) [phone] (work)      All above HIPAA information was verified with patient.     Was a Nurse, learning disability used for this call? No    Completed refill call assessment today to schedule patient's medication shipment from the [facility] and Home Delivery Pharmacy  [phone]).  All relevant notes have been reviewed.     Specialty medication(s) and dose(s) confirmed: Regimen is correct and unchanged.   Changes to medications: Livan reports no changes at this time.  Changes to insurance: No  New side effects reported not previously addressed with a pharmacist or physician: None reported  Questions for the pharmacist: No    Confirmed patient received a Conservation officer, historic buildings and a Surveyor, mining with first shipment. The patient will receive a drug information handout for each medication shipped and additional FDA Medication Guides as required.       DISEASE/MEDICATION-SPECIFIC INFORMATION        N/A    SPECIALTY MEDICATION ADHERENCE     Medication Adherence    Patient reported X missed doses in the last month: 0  Specialty Medication: tukysa 150mg   Patient is on additional specialty medications: Yes  Additional Specialty Medications: tukysa 150mg   Patient Reported Additional Medication X Missed Doses in the Last Month: 0              Were doses missed due to medication being on hold? No    tukysa 150mg    : 13 days of medicine on hand   tukysa 50mg    : 13 days of medicine on hand       REFERRAL TO PHARMACIST     Referral to the pharmacist: Not needed      [facility]     Shipping address confirmed in Epic.     Cost and Payment: Patient has a $0 copay, payment information is not required.    Delivery Scheduled: Yes, Expected medication delivery date: 5/22.     Medication will be delivered via Next Day Courier to the prescription address in Epic WAM.    Gay Katayama   [facility] Specialty and Home Delivery Pharmacy  Specialty Technician

## 2023-06-29 MED FILL — TUKYSA 50 MG TABLET: ORAL | 30 days supply | Qty: 120 | Fill #4

## 2023-06-29 MED FILL — TUKYSA 150 MG TABLET: ORAL | 30 days supply | Qty: 60 | Fill #4

## 2023-06-30 ENCOUNTER — Ambulatory Visit: Admit: 2023-06-30 | Discharge: 2023-07-01 | Payer: MEDICARE

## 2023-06-30 ENCOUNTER — Encounter: Admit: 2023-06-30 | Discharge: 2023-07-01 | Payer: MEDICARE

## 2023-06-30 DIAGNOSIS — C20 Malignant neoplasm of rectum: Principal | ICD-10-CM

## 2023-06-30 LAB — COMPREHENSIVE METABOLIC PANEL
ALBUMIN: 3.7 g/dL (ref 3.4–5.0)
ALKALINE PHOSPHATASE: 116 U/L (ref 46–116)
ALT (SGPT): 30 U/L (ref 10–49)
ANION GAP: 12 mmol/L (ref 5–14)
AST (SGOT): 26 U/L (ref <=34)
BILIRUBIN TOTAL: 0.7 mg/dL (ref 0.3–1.2)
BLOOD UREA NITROGEN: 19 mg/dL (ref 9–23)
BUN / CREAT RATIO: 19
CALCIUM: 8.5 mg/dL — ABNORMAL LOW (ref 8.7–10.4)
CHLORIDE: 104 mmol/L (ref 98–107)
CO2: 26.9 mmol/L (ref 20.0–31.0)
CREATININE: 0.98 mg/dL (ref 0.73–1.18)
EGFR CKD-EPI (2021) MALE: >90 mL/min/1.73m2 (ref >=60)
GLUCOSE RANDOM: 206 mg/dL — ABNORMAL HIGH (ref 70–179)
POTASSIUM: 4.4 mmol/L (ref 3.4–4.8)
PROTEIN TOTAL: 6.8 g/dL (ref 5.7–8.2)
SODIUM: 143 mmol/L (ref 135–145)

## 2023-06-30 LAB — CBC W/ AUTO DIFF
BASOPHILS ABSOLUTE COUNT: 0 10*9/L (ref 0.0–0.1)
BASOPHILS RELATIVE PERCENT: 0.7 %
EOSINOPHILS ABSOLUTE COUNT: 0.1 10*9/L (ref 0.0–0.5)
EOSINOPHILS RELATIVE PERCENT: 2 %
HEMATOCRIT: 41.3 % (ref 39.0–48.0)
HEMOGLOBIN: 14.3 g/dL (ref 12.9–16.5)
LYMPHOCYTES ABSOLUTE COUNT: 1.4 10*9/L (ref 1.1–3.6)
LYMPHOCYTES RELATIVE PERCENT: 30.2 %
MEAN CORPUSCULAR HEMOGLOBIN CONC: 34.7 g/dL (ref 32.0–36.0)
MEAN CORPUSCULAR HEMOGLOBIN: 30.3 pg (ref 25.9–32.4)
MEAN CORPUSCULAR VOLUME: 87.4 fL (ref 77.6–95.7)
MEAN PLATELET VOLUME: 10.1 fL (ref 6.8–10.7)
MONOCYTES ABSOLUTE COUNT: 0.3 10*9/L (ref 0.3–0.8)
MONOCYTES RELATIVE PERCENT: 7.4 %
NEUTROPHILS ABSOLUTE COUNT: 2.8 10*9/L (ref 1.8–7.8)
NEUTROPHILS RELATIVE PERCENT: 59.7 %
NUCLEATED RED BLOOD CELLS: 0 /100{WBCs} (ref <=4)
PLATELET COUNT: 150 10*9/L (ref 150–450)
RED BLOOD CELL COUNT: 4.73 10*12/L (ref 4.26–5.60)
RED CELL DISTRIBUTION WIDTH: 13 % (ref 12.2–15.2)
WBC ADJUSTED: 4.7 10*9/L (ref 3.6–11.2)

## 2023-06-30 LAB — CEA: CARCINOEMBRYONIC ANTIGEN: <2 ng/mL (ref 0.0–5.0)

## 2023-06-30 MED ADMIN — trastuzumab-qyyp (TRAZIMERA) 600 mg in sodium chloride (NS) 0.9 % 250 mL IVPB: 6 mg/kg | INTRAVENOUS | @ 18:00:00 | Stop: 2023-06-30

## 2023-06-30 NOTE — Unmapped (Signed)
 Patient arrived in the infusion clinic. Weight and Vitals were obtained. Port was accessed during nurse visit, flushed with blood return, dressing clean, dry, and intact. Labs were drawn and resulted. Patient was given chemotherapy regiment as ordered without complications while in the clinic. Port was flushed with blood return, heparin locked, then de-accessed area covered with 2x2 gauze and ban-aid. Patient then discharged home to self care.

## 2023-07-27 DIAGNOSIS — C20 Malignant neoplasm of rectum: Principal | ICD-10-CM

## 2023-07-27 NOTE — Unmapped (Signed)
 [facility] Specialty and Home Delivery Pharmacy Refill Coordination Note    Specialty Medication(s) to be Shipped:   TUKYSA 150 mg tablet (tucatinib)  TUKYSA 50 mg tablet (tucatinib)    Other medication(s) to be shipped: No additional medications requested for fill at this time     Max Stewart, DOB: 1970/07/26  Phone: [phone] (home) [phone] (work)      All above HIPAA information was verified with patient.     Was a Nurse, learning disability used for this call? No    Completed refill call assessment today to schedule patient's medication shipment from the [facility] and Home Delivery Pharmacy  [phone]).  All relevant notes have been reviewed.     Specialty medication(s) and dose(s) confirmed: Regimen is correct and unchanged.   Changes to medications: Max Stewart reports no changes at this time.  Changes to insurance: No  New side effects reported not previously addressed with a pharmacist or physician: None reported  Questions for the pharmacist: No    Confirmed patient received a Conservation officer, historic buildings and a Surveyor, mining with first shipment. The patient will receive a drug information handout for each medication shipped and additional FDA Medication Guides as required.       DISEASE/MEDICATION-SPECIFIC INFORMATION        N/A    SPECIALTY MEDICATION ADHERENCE     Medication Adherence    Patient reported X missed doses in the last month: 0  Specialty Medication: tukysa 150mg   Patient is on additional specialty medications: Yes  Additional Specialty Medications: tukysa 50mg   Patient Reported Additional Medication X Missed Doses in the Last Month: 0  Patient is on more than two specialty medications: No              Were doses missed due to medication being on hold? No    TUKYSA 150 mg tablet (tucatinib): 9 days of medicine on hand   TUKYSA 50 mg tablet (tucatinib): 9 days of medicine on hand     REFERRAL TO PHARMACIST     Referral to the pharmacist: Not needed      [facility]     Shipping address confirmed in Epic. Cost and Payment: Patient has a $0 copay, payment information is not required.    Delivery Scheduled: Yes, Expected medication delivery date: 08/04/2023.     Medication will be delivered via Next Day Courier to the prescription address in Epic WAM.    Max Stewart Max Stewart   [facility] Specialty and Home Delivery Pharmacy  Specialty Technician

## 2023-08-03 DIAGNOSIS — C20 Malignant neoplasm of rectum: Principal | ICD-10-CM

## 2023-08-03 MED FILL — TUKYSA 50 MG TABLET: ORAL | 30 days supply | Qty: 120 | Fill #5

## 2023-08-03 MED FILL — TUKYSA 150 MG TABLET: ORAL | 30 days supply | Qty: 60 | Fill #5

## 2023-08-03 NOTE — Unmapped (Signed)
 Copied from CRM #[id number]. Topic: Care Management - Discuss Care Plan  >> Aug 03, 2023  1:50 PM Gwendlyn FALCON wrote:      Erskin Bamberger contacted the Communication Center requesting to speak with the care team of Max Stewart to discuss:    Requested to speak with a member of the care team. Wanted to discuss his treatment plan. Stated he expected to have more treatments but had not seen any scheduled.    Please contact Max Stewart at [phone].    Thank you,   Gwendlyn Janne Coup  [facility]   [phone]

## 2023-08-04 DIAGNOSIS — C20 Malignant neoplasm of rectum: Principal | ICD-10-CM

## 2023-08-05 DIAGNOSIS — C20 Malignant neoplasm of rectum: Principal | ICD-10-CM

## 2023-08-08 ENCOUNTER — Encounter: Admit: 2023-08-08 | Discharge: 2023-08-09 | Payer: MEDICARE

## 2023-08-08 ENCOUNTER — Encounter
Admit: 2023-08-08 | Discharge: 2023-08-09 | Payer: MEDICARE | Attending: Hematology & Oncology | Primary: Hematology & Oncology

## 2023-08-08 DIAGNOSIS — C20 Malignant neoplasm of rectum: Principal | ICD-10-CM

## 2023-08-08 LAB — CBC W/ AUTO DIFF
BASOPHILS ABSOLUTE COUNT: 0 10*9/L (ref 0.0–0.1)
BASOPHILS RELATIVE PERCENT: 0.7 %
EOSINOPHILS ABSOLUTE COUNT: 0.1 10*9/L (ref 0.0–0.5)
EOSINOPHILS RELATIVE PERCENT: 2 %
HEMATOCRIT: 42.1 % (ref 39.0–48.0)
HEMOGLOBIN: 14.6 g/dL (ref 12.9–16.5)
LYMPHOCYTES ABSOLUTE COUNT: 1.3 10*9/L (ref 1.1–3.6)
LYMPHOCYTES RELATIVE PERCENT: 31.1 %
MEAN CORPUSCULAR HEMOGLOBIN CONC: 34.7 g/dL (ref 32.0–36.0)
MEAN CORPUSCULAR HEMOGLOBIN: 30.3 pg (ref 25.9–32.4)
MEAN CORPUSCULAR VOLUME: 87.3 fL (ref 77.6–95.7)
MEAN PLATELET VOLUME: 9.8 fL (ref 6.8–10.7)
MONOCYTES ABSOLUTE COUNT: 0.3 10*9/L (ref 0.3–0.8)
MONOCYTES RELATIVE PERCENT: 8 %
NEUTROPHILS ABSOLUTE COUNT: 2.5 10*9/L (ref 1.8–7.8)
NEUTROPHILS RELATIVE PERCENT: 58.2 %
NUCLEATED RED BLOOD CELLS: 0 /100{WBCs} (ref <=4)
PLATELET COUNT: 144 10*9/L — ABNORMAL LOW (ref 150–450)
RED BLOOD CELL COUNT: 4.83 10*12/L (ref 4.26–5.60)
RED CELL DISTRIBUTION WIDTH: 13.5 % (ref 12.2–15.2)
WBC ADJUSTED: 4.2 10*9/L (ref 3.6–11.2)

## 2023-08-08 LAB — COMPREHENSIVE METABOLIC PANEL
ALBUMIN: 3.8 g/dL (ref 3.4–5.0)
ALKALINE PHOSPHATASE: 94 U/L (ref 46–116)
ALT (SGPT): 28 U/L (ref 10–49)
ANION GAP: 12 mmol/L (ref 5–14)
AST (SGOT): 24 U/L (ref <=34)
BILIRUBIN TOTAL: 0.7 mg/dL (ref 0.3–1.2)
BLOOD UREA NITROGEN: 25 mg/dL — ABNORMAL HIGH (ref 9–23)
BUN / CREAT RATIO: 22
CALCIUM: 8.8 mg/dL (ref 8.7–10.4)
CHLORIDE: 105 mmol/L (ref 98–107)
CO2: 28.3 mmol/L (ref 20.0–31.0)
CREATININE: 1.14 mg/dL (ref 0.73–1.18)
EGFR CKD-EPI (2021) MALE: 76 mL/min/1.73m2 (ref >=60)
GLUCOSE RANDOM: 152 mg/dL (ref 70–179)
POTASSIUM: 4.1 mmol/L (ref 3.4–4.8)
PROTEIN TOTAL: 6.5 g/dL (ref 5.7–8.2)
SODIUM: 145 mmol/L (ref 135–145)

## 2023-08-08 LAB — CEA: CARCINOEMBRYONIC ANTIGEN: 2.1 ng/mL (ref 0.0–5.0)

## 2023-08-08 MED ADMIN — heparin, porcine (PF) 100 unit/mL injection 500 Units: 500 [IU] | INTRAVENOUS | @ 14:00:00 | Stop: 2023-08-08

## 2023-08-08 MED ADMIN — trastuzumab-qyyp (TRAZIMERA) 600 mg in sodium chloride (NS) 0.9 % 250 mL IVPB: 6 mg/kg | INTRAVENOUS | @ 14:00:00 | Stop: 2023-08-08

## 2023-08-08 NOTE — Unmapped (Signed)
 Infusion on 08/08/2023   Component Date Value Ref Range Status    Sodium 08/08/2023 145  135 - 145 mmol/L Final    Potassium 08/08/2023 4.1  3.4 - 4.8 mmol/L Final    Chloride 08/08/2023 105  98 - 107 mmol/L Final    CO2 08/08/2023 28.3  20.0 - 31.0 mmol/L Final    Anion Gap 08/08/2023 12  5 - 14 mmol/L Final    BUN 08/08/2023 25 (H)  9 - 23 mg/dL Final    Creatinine 93/69/7974 1.14  0.73 - 1.18 mg/dL Final    BUN/Creatinine Ratio 08/08/2023 22   Final    eGFR CKD-EPI (2021) Male 08/08/2023 76  >=60 mL/min/1.83m2 Final    eGFR calculated with CKD-EPI 2021 equation in accordance with [employer] and [employer] of Nephrology Task Force recommendations.    Glucose 08/08/2023 152  70 - 179 mg/dL Final    Calcium 93/69/7974 8.8  8.7 - 10.4 mg/dL Final    Albumin 93/69/7974 3.8  3.4 - 5.0 g/dL Final    Total Protein 08/08/2023 6.5  5.7 - 8.2 g/dL Final    Total Bilirubin 08/08/2023 0.7  0.3 - 1.2 mg/dL Final    AST 93/69/7974 24  <=34 U/L Final    ALT 08/08/2023 28  10 - 49 U/L Final    Alkaline Phosphatase 08/08/2023 94  46 - 116 U/L Final    WBC 08/08/2023 4.2  3.6 - 11.2 10*9/L Final    RBC 08/08/2023 4.83  4.26 - 5.60 10*12/L Final    HGB 08/08/2023 14.6  12.9 - 16.5 g/dL Final    HCT 93/69/7974 42.1  39.0 - 48.0 % Final    MCV 08/08/2023 87.3  77.6 - 95.7 fL Final    MCH 08/08/2023 30.3  25.9 - 32.4 pg Final    MCHC 08/08/2023 34.7  32.0 - 36.0 g/dL Final    RDW 93/69/7974 13.5  12.2 - 15.2 % Final    MPV 08/08/2023 9.8  6.8 - 10.7 fL Final    Platelet 08/08/2023 144 (L)  150 - 450 10*9/L Final    nRBC 08/08/2023 0  <=4 /100 WBCs Final    Neutrophils % 08/08/2023 58.2  % Final    Lymphocytes % 08/08/2023 31.1  % Final    Monocytes % 08/08/2023 8.0  % Final    Eosinophils % 08/08/2023 2.0  % Final    Basophils % 08/08/2023 0.7  % Final    Absolute Neutrophils 08/08/2023 2.5  1.8 - 7.8 10*9/L Final    Absolute Lymphocytes 08/08/2023 1.3  1.1 - 3.6 10*9/L Final    Absolute Monocytes 08/08/2023 0.3 0.3 - 0.8 10*9/L Final    Absolute Eosinophils 08/08/2023 0.1  0.0 - 0.5 10*9/L Final    Absolute Basophils 08/08/2023 0.0  0.0 - 0.1 10*9/L Final

## 2023-08-08 NOTE — Unmapped (Signed)
 Patient arrived in the infusion clinic at [phone]. Weight and vitals were obtained. Port was accessed during visit, RN flushed with blood return, dressing clean, dry and intact.   Labs were drawn and resulted. No pre meds or parameters per protocol. Patient was administered chemotherapy regimen as ordered without complications while in the clinic. Pt port heparin  flushed and deaccessed, covered with 2x2 gauze and bandaid. Patient discharged home to self care.

## 2023-08-31 DIAGNOSIS — C20 Malignant neoplasm of rectum: Principal | ICD-10-CM

## 2023-08-31 NOTE — Unmapped (Signed)
 08/31/2023 - Patient originally requested delivery for 7/23. Delivery is not possible on this date due to not enough notice to order. I have reached out to the patient and confirmed that delivery on 7/25 is ok.      [facility] Specialty and Home Delivery Pharmacy Refill Coordination Note    Max Stewart, DOB: 11/25/1970  Phone: [phone] (home) [phone] (work)      All above HIPAA information was verified with patient.         08/31/2023     7:58 AM   Specialty Rx Medication Refill Questionnaire   Which Medications would you like refilled and shipped? Tukysa    Please list all current allergies: Cipro shellfish   Have you missed any doses in the last 30 days? No   Have you had any changes to your medication(s) since your last refill? No   How much of each medication do you have remaining at home? (eg. number of tablets, injections, etc.) 2 days. Need asap   Have you experienced any side effects in the last 30 days? No   Please enter the full address (street address, city, state, zip code) where you would like your medication(s) to be delivered to. [address], [place] [address]   Please specify on which day you would like your medication(s) to arrive. Note: if you need your medication(s) within 3 days, please call the pharmacy to schedule your order at [phone]  08/31/2023   Has your insurance changed since your last refill? No   Would you like a pharmacist to call you to discuss your medication(s)? No   Do you require a signature for your package? (Note: if we are billing Medicare Part B or your order contains a controlled substance, we will require a signature) No   I have been provided my out of pocket cost for my medication and approve the pharmacy to charge the amount to my credit card on file. Yes   Additional Comments: Tha ks         Completed refill call assessment today to schedule patient's medication shipment from the [facility] and Home Delivery Pharmacy [phone]).  All relevant notes have been reviewed.       Confirmed patient received a Conservation officer, historic buildings and a Surveyor, mining with first shipment. The patient will receive a drug information handout for each medication shipped and additional FDA Medication Guides as required.         REFERRAL TO PHARMACIST     Referral to the pharmacist: Not needed      [facility]     Shipping address confirmed in Epic.     Delivery Scheduled: Yes, Expected medication delivery date: 7/25.     Medication will be delivered via Same Day Courier to the prescription address in Epic WAM.    Dorothyann JAYSON Lands   [facility] Specialty and Home Delivery Pharmacy Specialty Technician

## 2023-09-01 ENCOUNTER — Encounter: Admit: 2023-09-01 | Discharge: 2023-09-02 | Payer: MEDICARE

## 2023-09-01 ENCOUNTER — Encounter
Admit: 2023-09-01 | Discharge: 2023-09-02 | Payer: MEDICARE | Attending: Hematology & Oncology | Primary: Hematology & Oncology

## 2023-09-01 DIAGNOSIS — C20 Malignant neoplasm of rectum: Principal | ICD-10-CM

## 2023-09-01 LAB — CBC W/ AUTO DIFF
BASOPHILS ABSOLUTE COUNT: 0 10*9/L (ref 0.0–0.1)
BASOPHILS RELATIVE PERCENT: 0.7 %
EOSINOPHILS ABSOLUTE COUNT: 0.1 10*9/L (ref 0.0–0.5)
EOSINOPHILS RELATIVE PERCENT: 2.7 %
HEMATOCRIT: 42.4 % (ref 39.0–48.0)
HEMOGLOBIN: 14.7 g/dL (ref 12.9–16.5)
LYMPHOCYTES ABSOLUTE COUNT: 1.5 10*9/L (ref 1.1–3.6)
LYMPHOCYTES RELATIVE PERCENT: 31.1 %
MEAN CORPUSCULAR HEMOGLOBIN CONC: 34.6 g/dL (ref 32.0–36.0)
MEAN CORPUSCULAR HEMOGLOBIN: 30.2 pg (ref 25.9–32.4)
MEAN CORPUSCULAR VOLUME: 87.3 fL (ref 77.6–95.7)
MEAN PLATELET VOLUME: 9.2 fL (ref 6.8–10.7)
MONOCYTES ABSOLUTE COUNT: 0.4 10*9/L (ref 0.3–0.8)
MONOCYTES RELATIVE PERCENT: 8.4 %
NEUTROPHILS ABSOLUTE COUNT: 2.7 10*9/L (ref 1.8–7.8)
NEUTROPHILS RELATIVE PERCENT: 57.1 %
NUCLEATED RED BLOOD CELLS: 0 /100{WBCs} (ref <=4)
PLATELET COUNT: 162 10*9/L (ref 150–450)
RED BLOOD CELL COUNT: 4.85 10*12/L (ref 4.26–5.60)
RED CELL DISTRIBUTION WIDTH: 13.1 % (ref 12.2–15.2)
WBC ADJUSTED: 4.8 10*9/L (ref 3.6–11.2)

## 2023-09-01 LAB — COMPREHENSIVE METABOLIC PANEL
ALBUMIN: 3.8 g/dL (ref 3.4–5.0)
ALKALINE PHOSPHATASE: 114 U/L (ref 46–116)
ALT (SGPT): 35 U/L (ref 10–49)
ANION GAP: 12 mmol/L (ref 5–14)
AST (SGOT): 21 U/L (ref <=34)
BILIRUBIN TOTAL: 0.5 mg/dL (ref 0.3–1.2)
BLOOD UREA NITROGEN: 21 mg/dL (ref 9–23)
BUN / CREAT RATIO: 21
CALCIUM: 8.9 mg/dL (ref 8.7–10.4)
CHLORIDE: 104 mmol/L (ref 98–107)
CO2: 28.5 mmol/L (ref 20.0–31.0)
CREATININE: 0.99 mg/dL (ref 0.73–1.18)
EGFR CKD-EPI (2021) MALE: >90 mL/min/1.73m2 (ref >=60)
GLUCOSE RANDOM: 117 mg/dL (ref 70–179)
POTASSIUM: 4 mmol/L (ref 3.4–4.8)
PROTEIN TOTAL: 6.6 g/dL (ref 5.7–8.2)
SODIUM: 144 mmol/L (ref 135–145)

## 2023-09-01 LAB — CEA: CARCINOEMBRYONIC ANTIGEN: <2 ng/mL (ref 0.0–5.0)

## 2023-09-01 MED ADMIN — trastuzumab-qyyp (TRAZIMERA) 600 mg in sodium chloride (NS) 0.9 % 250 mL IVPB: 6 mg/kg | INTRAVENOUS | @ 13:00:00 | Stop: 2023-09-01

## 2023-09-01 MED ADMIN — heparin, porcine (PF) 100 unit/mL injection 500 Units: 500 [IU] | INTRAVENOUS | @ 14:00:00 | Stop: 2023-09-01

## 2023-09-01 NOTE — Unmapped (Signed)
 Infusion on 09/01/2023   Component Date Value Ref Range Status    Sodium 09/01/2023 144  135 - 145 mmol/L Final    Potassium 09/01/2023 4.0  3.4 - 4.8 mmol/L Final    Chloride 09/01/2023 104  98 - 107 mmol/L Final    CO2 09/01/2023 28.5  20.0 - 31.0 mmol/L Final    Anion Gap 09/01/2023 12  5 - 14 mmol/L Final    BUN 09/01/2023 21  9 - 23 mg/dL Final    Creatinine 92/75/7974 0.99  0.73 - 1.18 mg/dL Final    BUN/Creatinine Ratio 09/01/2023 21   Final    eGFR CKD-EPI (2021) Male 09/01/2023 >90  >=60 mL/min/1.11m2 Final    eGFR calculated with CKD-EPI 2021 equation in accordance with [employer] and [employer] of Nephrology Task Force recommendations.    Glucose 09/01/2023 117  70 - 179 mg/dL Final    Calcium 92/75/7974 8.9  8.7 - 10.4 mg/dL Final    Albumin 92/75/7974 3.8  3.4 - 5.0 g/dL Final    Total Protein 09/01/2023 6.6  5.7 - 8.2 g/dL Final    Total Bilirubin 09/01/2023 0.5  0.3 - 1.2 mg/dL Final    AST 92/75/7974 21  <=34 U/L Final    ALT 09/01/2023 35  10 - 49 U/L Final    Alkaline Phosphatase 09/01/2023 114  46 - 116 U/L Final    WBC 09/01/2023 4.8  3.6 - 11.2 10*9/L Final    RBC 09/01/2023 4.85  4.26 - 5.60 10*12/L Final    HGB 09/01/2023 14.7  12.9 - 16.5 g/dL Final    HCT 92/75/7974 42.4  39.0 - 48.0 % Final    MCV 09/01/2023 87.3  77.6 - 95.7 fL Final    MCH 09/01/2023 30.2  25.9 - 32.4 pg Final    MCHC 09/01/2023 34.6  32.0 - 36.0 g/dL Final    RDW 92/75/7974 13.1  12.2 - 15.2 % Final    MPV 09/01/2023 9.2  6.8 - 10.7 fL Final    Platelet 09/01/2023 162  150 - 450 10*9/L Final    nRBC 09/01/2023 0  <=4 /100 WBCs Final    Neutrophils % 09/01/2023 57.1  % Final    Lymphocytes % 09/01/2023 31.1  % Final    Monocytes % 09/01/2023 8.4  % Final    Eosinophils % 09/01/2023 2.7  % Final    Basophils % 09/01/2023 0.7  % Final    Absolute Neutrophils 09/01/2023 2.7  1.8 - 7.8 10*9/L Final    Absolute Lymphocytes 09/01/2023 1.5  1.1 - 3.6 10*9/L Final    Absolute Monocytes 09/01/2023 0.4  0.3 - 0.8 10*9/L Final    Absolute Eosinophils 09/01/2023 0.1  0.0 - 0.5 10*9/L Final    Absolute Basophils 09/01/2023 0.0  0.0 - 0.1 10*9/L Final

## 2023-09-01 NOTE — Unmapped (Signed)
 Access:  [employer]: no parameters in tx plan    Patient reported new or worsening symptoms: no    Dignicap: No     Treatment Administered :  trastuzumab     Patient Education Performed: no    Pre-Chemotherapy Blood Return : Yes     Hypersensitivity Reaction: no    Post Chemotherapy Blood Return: Yes     Heparin  Locked: Yes    Dressing: Band-aid     Stable Discharge: Yes

## 2023-09-02 DIAGNOSIS — C20 Malignant neoplasm of rectum: Principal | ICD-10-CM

## 2023-09-02 MED FILL — TUKYSA 50 MG TABLET: ORAL | 30 days supply | Qty: 120 | Fill #6

## 2023-09-02 MED FILL — TUKYSA 150 MG TABLET: ORAL | 30 days supply | Qty: 60 | Fill #6

## 2023-09-22 ENCOUNTER — Encounter
Admit: 2023-09-22 | Discharge: 2023-09-23 | Payer: MEDICARE | Attending: Hematology & Oncology | Primary: Hematology & Oncology

## 2023-09-22 ENCOUNTER — Encounter: Admit: 2023-09-22 | Discharge: 2023-09-23 | Payer: MEDICARE

## 2023-09-22 DIAGNOSIS — C20 Malignant neoplasm of rectum: Principal | ICD-10-CM

## 2023-09-22 LAB — COMPREHENSIVE METABOLIC PANEL
ALBUMIN: 3.7 g/dL (ref 3.4–5.0)
ALKALINE PHOSPHATASE: 206 U/L — ABNORMAL HIGH (ref 46–116)
ALT (SGPT): 228 U/L — ABNORMAL HIGH (ref 10–49)
ANION GAP: 13 mmol/L (ref 5–14)
AST (SGOT): 147 U/L — ABNORMAL HIGH (ref <=34)
BILIRUBIN TOTAL: 0.6 mg/dL (ref 0.3–1.2)
BLOOD UREA NITROGEN: 19 mg/dL (ref 9–23)
BUN / CREAT RATIO: 17
CALCIUM: 8.6 mg/dL — ABNORMAL LOW (ref 8.7–10.4)
CHLORIDE: 101 mmol/L (ref 98–107)
CO2: 27 mmol/L (ref 20.0–31.0)
CREATININE: 1.12 mg/dL (ref 0.73–1.18)
EGFR CKD-EPI (2021) MALE: 78 mL/min/1.73m2 (ref >=60)
GLUCOSE RANDOM: 105 mg/dL (ref 70–179)
POTASSIUM: 3.7 mmol/L (ref 3.4–4.8)
PROTEIN TOTAL: 6.7 g/dL (ref 5.7–8.2)
SODIUM: 141 mmol/L (ref 135–145)

## 2023-09-22 LAB — CBC W/ AUTO DIFF
BASOPHILS ABSOLUTE COUNT: 0 10*9/L (ref 0.0–0.1)
BASOPHILS RELATIVE PERCENT: 0.5 %
EOSINOPHILS ABSOLUTE COUNT: 0 10*9/L (ref 0.0–0.5)
EOSINOPHILS RELATIVE PERCENT: 0.5 %
HEMATOCRIT: 41.8 % (ref 39.0–48.0)
HEMOGLOBIN: 14.6 g/dL (ref 12.9–16.5)
LYMPHOCYTES ABSOLUTE COUNT: 1 10*9/L — ABNORMAL LOW (ref 1.1–3.6)
LYMPHOCYTES RELATIVE PERCENT: 25.3 %
MEAN CORPUSCULAR HEMOGLOBIN CONC: 34.9 g/dL (ref 32.0–36.0)
MEAN CORPUSCULAR HEMOGLOBIN: 30.2 pg (ref 25.9–32.4)
MEAN CORPUSCULAR VOLUME: 86.4 fL (ref 77.6–95.7)
MEAN PLATELET VOLUME: 9.4 fL (ref 6.8–10.7)
MONOCYTES ABSOLUTE COUNT: 0.5 10*9/L (ref 0.3–0.8)
MONOCYTES RELATIVE PERCENT: 12.8 %
NEUTROPHILS ABSOLUTE COUNT: 2.3 10*9/L (ref 1.8–7.8)
NEUTROPHILS RELATIVE PERCENT: 60.9 %
NUCLEATED RED BLOOD CELLS: 0 /100{WBCs} (ref <=4)
PLATELET COUNT: 110 10*9/L — ABNORMAL LOW (ref 150–450)
RED BLOOD CELL COUNT: 4.84 10*12/L (ref 4.26–5.60)
RED CELL DISTRIBUTION WIDTH: 13.1 % (ref 12.2–15.2)
WBC ADJUSTED: 3.8 10*9/L (ref 3.6–11.2)

## 2023-09-22 LAB — CEA: CARCINOEMBRYONIC ANTIGEN: <2 ng/mL (ref 0.0–5.0)

## 2023-09-22 MED ADMIN — trastuzumab-qyyp (TRAZIMERA) 600 mg in sodium chloride (NS) 0.9 % 250 mL IVPB: 6 mg/kg | INTRAVENOUS | @ 13:00:00 | Stop: 2023-09-22

## 2023-09-22 MED ADMIN — heparin, porcine (PF) 100 unit/mL injection 500 Units: 500 [IU] | INTRAVENOUS | @ 14:00:00 | Stop: 2023-09-22

## 2023-09-22 NOTE — Unmapped (Signed)
 Infusion on 09/22/2023   Component Date Value Ref Range Status    Sodium 09/22/2023 141  135 - 145 mmol/L Final    Potassium 09/22/2023 3.7  3.4 - 4.8 mmol/L Final    Chloride 09/22/2023 101  98 - 107 mmol/L Final    CO2 09/22/2023 27.0  20.0 - 31.0 mmol/L Final    Anion Gap 09/22/2023 13  5 - 14 mmol/L Final    BUN 09/22/2023 19  9 - 23 mg/dL Final    Creatinine 91/85/7974 1.12  0.73 - 1.18 mg/dL Final    BUN/Creatinine Ratio 09/22/2023 17   Final    eGFR CKD-EPI (2021) Male 09/22/2023 78  >=60 mL/min/1.9m2 Final    eGFR calculated with CKD-EPI 2021 equation in accordance with [employer] and [employer] of Nephrology Task Force recommendations.    Glucose 09/22/2023 105  70 - 179 mg/dL Final    Calcium 91/85/7974 8.6 (L)  8.7 - 10.4 mg/dL Final    Albumin 91/85/7974 3.7  3.4 - 5.0 g/dL Final    Total Protein 09/22/2023 6.7  5.7 - 8.2 g/dL Final    Total Bilirubin 09/22/2023 0.6  0.3 - 1.2 mg/dL Final    AST 91/85/7974 147 (H)  <=34 U/L Final    ALT 09/22/2023 228 (H)  10 - 49 U/L Final    Alkaline Phosphatase 09/22/2023 206 (H)  46 - 116 U/L Final    WBC 09/22/2023 3.8  3.6 - 11.2 10*9/L Final    RBC 09/22/2023 4.84  4.26 - 5.60 10*12/L Final    HGB 09/22/2023 14.6  12.9 - 16.5 g/dL Final    HCT 91/85/7974 41.8  39.0 - 48.0 % Final    MCV 09/22/2023 86.4  77.6 - 95.7 fL Final    MCH 09/22/2023 30.2  25.9 - 32.4 pg Final    MCHC 09/22/2023 34.9  32.0 - 36.0 g/dL Final    RDW 91/85/7974 13.1  12.2 - 15.2 % Final    MPV 09/22/2023 9.4  6.8 - 10.7 fL Final    Platelet 09/22/2023 110 (L)  150 - 450 10*9/L Final    nRBC 09/22/2023 0  <=4 /100 WBCs Final    Neutrophils % 09/22/2023 60.9  % Final    Lymphocytes % 09/22/2023 25.3  % Final    Monocytes % 09/22/2023 12.8  % Final    Eosinophils % 09/22/2023 0.5  % Final    Basophils % 09/22/2023 0.5  % Final    Absolute Neutrophils 09/22/2023 2.3  1.8 - 7.8 10*9/L Final    Absolute Lymphocytes 09/22/2023 1.0 (L)  1.1 - 3.6 10*9/L Final    Absolute Monocytes 09/22/2023 0.5  0.3 - 0.8 10*9/L Final    Absolute Eosinophils 09/22/2023 0.0  0.0 - 0.5 10*9/L Final    Absolute Basophils 09/22/2023 0.0  0.0 - 0.1 10*9/L Final

## 2023-09-22 NOTE — Unmapped (Signed)
 Access:  PORT    Labs: treatment plan with no parameters - Dr. Orvis made aware of pt elevated LFT's    Patient reported new or worsening symptoms (if yes, explanation needed): No - Pt denies any abd pain or N/V/D though admits to increased fatigue over the last week.    Provider Notified: Yes - Dr. Orvis notified of pt elevated LFT's     Ok to Treat given: Yes    Treatment Administered : OP TRASTUZUMAB  Q3W     Patient Education Performed: Yes = reviewed plan to HOLD tucatinib  x 1 week and return for lab draw next week per Dr. Orvis. Pt also aware to reduce any alcohol or Tylenol  intake. Pt verbalized understanding and aware to schedule appointments when he checks out today after treatment    Time Spent on Patient Education: 5 minutes    Pre-Chemotherapy Blood Return : Yes    Hypersensitivity Reaction (if yes, explanation needed): No    Post Chemotherapy Blood Return: Yes    Heparin  Locked: Yes    Dressing: gauze and band-aid    Stable Discharge (if no explanation needed): Yes    - Pt ambulated with stable gait to treatment area for planned treatment, Pt weight and vitals obtained. Pt states feeling well and denies any complaints at this time other than some generalized fatigue over last week. Pt Port accessed via sterile technique with good BR and labs drawn and sent. No Lab parameters to treat. Request for drug sent to pharmacy. Pt aware and in agreement with plan.      - Treatment administered per order and Pt tolerated treatment well with no adverse reaction noted. Port with good BR at completion and flushed and de-accessed per protocol. Patient was instructed to stop by the check out desk to complete today's visit and to schedule future appointments and discharged home to self care. Pt ambulated from treatment area with stable gait

## 2023-09-23 DIAGNOSIS — C20 Malignant neoplasm of rectum: Principal | ICD-10-CM

## 2023-09-26 DIAGNOSIS — C20 Malignant neoplasm of rectum: Principal | ICD-10-CM

## 2023-09-26 MED ORDER — TUKYSA 150 MG TABLET
ORAL_TABLET | Freq: Two times a day (BID) | ORAL | 6 refills | 30.00000 days
Start: 2023-09-26 — End: ?

## 2023-09-26 MED ORDER — TUKYSA 50 MG TABLET
ORAL_TABLET | Freq: Two times a day (BID) | ORAL | 6 refills | 30.00000 days
Start: 2023-09-26 — End: ?

## 2023-09-27 DIAGNOSIS — C20 Malignant neoplasm of rectum: Principal | ICD-10-CM

## 2023-09-27 NOTE — Unmapped (Signed)
Please refill if appropriate.    Most recent clinic visit: Visit date not found    Next clinic visit: Visit date not found

## 2023-09-27 NOTE — Unmapped (Addendum)
 Brief CPP Note:     I spoke with Max Stewart in infusion about his tucatinib . Dr. Orvis asked me to remind him to hold tucatinib  until next week when we can get more labs. He was aware and reported that he's been having minimal side effects overall. We had a brief discussion about his liver labs and he said he has been using tylenol  recently, which has been a change. He does not use frequently and has been just once a day recently.

## 2023-09-29 ENCOUNTER — Ambulatory Visit: Admit: 2023-09-29 | Discharge: 2023-09-30 | Payer: MEDICARE

## 2023-09-29 ENCOUNTER — Encounter
Admit: 2023-09-29 | Discharge: 2023-09-30 | Payer: MEDICARE | Attending: Hematology & Oncology | Primary: Hematology & Oncology

## 2023-09-29 DIAGNOSIS — C20 Malignant neoplasm of rectum: Principal | ICD-10-CM

## 2023-09-29 LAB — HEPATIC FUNCTION PANEL
ALBUMIN: 3.3 g/dL — ABNORMAL LOW (ref 3.4–5.0)
ALKALINE PHOSPHATASE: 128 U/L — ABNORMAL HIGH (ref 46–116)
ALT (SGPT): 39 U/L (ref 10–49)
AST (SGOT): 21 U/L (ref <=34)
BILIRUBIN DIRECT: 0.1 mg/dL (ref 0.00–0.30)
BILIRUBIN TOTAL: 0.3 mg/dL (ref 0.3–1.2)
PROTEIN TOTAL: 6.5 g/dL (ref 5.7–8.2)

## 2023-09-29 MED ORDER — ZZ IMS TEMPLATE
Freq: Two times a day (BID) | ORAL | 11 refills | 30.00000 days | Status: CN
Start: 2023-09-29 — End: ?

## 2023-09-29 MED ORDER — TUCATINIB 150 MG TABLET
ORAL_TABLET | Freq: Two times a day (BID) | ORAL | 11 refills | 30.00000 days | Status: CP
Start: 2023-09-29 — End: ?
  Filled 2023-10-05: qty 60, 30d supply, fill #0

## 2023-09-29 MED ORDER — TUCATINIB 50 MG TABLET
ORAL_TABLET | Freq: Two times a day (BID) | ORAL | 11 refills | 30.00000 days | Status: CP
Start: 2023-09-29 — End: ?

## 2023-09-29 MED ORDER — TUKYSA 150 MG TABLET
ORAL_TABLET | Freq: Two times a day (BID) | ORAL | 6 refills | 30.00000 days | Status: CN
Start: 2023-09-29 — End: ?

## 2023-09-29 MED ORDER — TUKYSA 50 MG TABLET
ORAL_TABLET | Freq: Two times a day (BID) | ORAL | 6 refills | 30.00000 days | Status: CN
Start: 2023-09-29 — End: ?

## 2023-09-29 MED ADMIN — heparin, porcine (PF) 100 unit/mL injection 500 Units: 500 [IU] | INTRAVENOUS | @ 13:00:00 | Stop: 2023-09-29

## 2023-09-29 NOTE — Unmapped (Signed)
Please refill if appropriate.    Most recent clinic visit: Visit date not found    Next clinic visit: Visit date not found

## 2023-09-29 NOTE — Unmapped (Signed)
 09/29/2023 - Spoke with patient to confirm patient needed both strengths of Tukysa , 150mg  and 50mg .      [facility] Specialty and Home Delivery Pharmacy Refill Coordination Note    Max Stewart, DOB: 1970/05/30  Phone: [phone] (home) [phone] (work)      All above HIPAA information was verified with patient.         09/26/2023     5:47 PM   Specialty Rx Medication Refill Questionnaire   Which Medications would you like refilled and shipped? Tucatnib   Please list all current allergies: Cipro shellfish   Have you missed any doses in the last 30 days? Yes   If Yes, how many doses have you missed ? 6   Have you had any changes to your medication(s) since your last refill? No   How much of each medication do you have remaining at home? (eg. number of tablets, injections, etc.) 2 weeks   Have you experienced any side effects in the last 30 days? No   Please enter the full address (street address, city, state, zip code) where you would like your medication(s) to be delivered to. [address], [place] [address]   Please specify on which day you would like your medication(s) to arrive. Note: if you need your medication(s) within 3 days, please call the pharmacy to schedule your order at [phone]  10/06/2023   Has your insurance changed since your last refill? No   Would you like a pharmacist to call you to discuss your medication(s)? No   Do you require a signature for your package? (Note: if we are billing Medicare Part B or your order contains a controlled substance, we will require a signature) No   I have been provided my out of pocket cost for my medication and approve the pharmacy to charge the amount to my credit card on file. Yes   Additional Comments: Missed doses due to high liver numbers and stopped taking for a week to see if it was the cause or the Tylenol  I was taking         Completed refill call assessment today to schedule patient's medication shipment from the [facility] Specialty and Home Delivery Pharmacy [phone]).  All relevant notes have been reviewed.       Confirmed patient received a Conservation officer, historic buildings and a Surveyor, mining with first shipment. The patient will receive a drug information handout for each medication shipped and additional FDA Medication Guides as required.         REFERRAL TO PHARMACIST     Referral to the pharmacist: Yes - high priority compliance concerns. Patient has missed more than 3 doses of medication. Refills were scheduled and concern routed (high priority) to pharmacist for evaluation.      SHIPPING     Shipping address confirmed in Epic.     Delivery Scheduled: Yes, Expected medication delivery date: 8/28.  However, Rx request for refills was sent to the provider as there are none remaining.     Medication will be delivered via Next Day Courier to the prescription address in Epic WAM.    Max Stewart   [facility] Specialty and Home Delivery Pharmacy Specialty Technician

## 2023-09-30 DIAGNOSIS — C20 Malignant neoplasm of rectum: Principal | ICD-10-CM

## 2023-10-03 NOTE — Unmapped (Signed)
 This pharmacist was notified by a technician that this patient has reported that they've missed 6 doses of their Tukysa .. I have reviewed the patient's medical record and have determined that no further pharmacist action is needed. The patient held their tucatinib  for a week for a spike in liver labs.      Approximate time spent: 0-5 minutes    Aneita DELENA [employer], [facility], Clinical Specialty Pharmacist  [facility]

## 2023-10-05 MED FILL — TUKYSA 50 MG TABLET: ORAL | 30 days supply | Qty: 120 | Fill #0

## 2023-10-06 DIAGNOSIS — C20 Malignant neoplasm of rectum: Principal | ICD-10-CM

## 2023-10-12 NOTE — Unmapped (Signed)
 [facility]     CONSULTING PROVIDERS  Dr. Franky Sprinkles - Radiation Oncology  Dr. Morene Freed, [facility] [place] [facility]   Dr. 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 FOLFOX x 8  05/2018 chemoradiotherapy to primary.   -Clinical complete response in liver and rectum, managed with Watch and Wait approach  03/2019 recurrent disease in rectum   05/2019 proctectomy with coloanal anastomosis, liver wedge resection (seg 3): liver with fibrosis, rectum invasive mod to poorly differentiated adenocarcinoma, 3 cm; into muscularis propria, no LVI, tumor at distal margin in area of tearing superceded by additional specimen, 0/16 nodes, two with prominent treatment effect. YpT2N0.  - complicated by abdominal abscess with MRSA  08/02/19 closure of diverting ileostomy  02/2020 2 sites of recurrence disease in liver adjacent to GB fossa  04/14/20 seg 4b wedge resection: two foci of adenocarcinoma c/w colorectal primary. Margin +; node negative   01/2021 SBRT 5000cGy in 8fx to met near GB fossa  06/2021 -09/2021 pelvic + possible right abd wall recurrence FOLFOX x 6  11/05/2021 tucatinib -trastuzumab  start [baseline echo nl]  - c1 LFTs 4x ULN. Improved quickly, resumed at tucatinib  250 bid    MOLECULAR PROFILE   TEMPUS from 06/2021 rectal mass biopsy:  HER2 amplified  TP53 and APC mutation  RAS/RAF WT  TMB 6.3, MSS    Treatment plan:  tucatinib  + trastuzumab .      ___________________________________________________________________    ASSESSMENT  1. 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 Stable    2. Type I DM- controlled on insulin  pump, no complications from DM  3. Elevated transaminases, improved from prior, CTM  4. ECOG 0    RECOMMENDATIONS  1. Tucatinib  250 mg BID + trastuzumab    2. RTC 3 weeks scans and treatment; 6 weeks for visit and treatment  Assessment & Plan  Metastatic HER2 amplified rectal adenocarcinoma    He continues to tolerate Tucatinib  and Trastuzumab  well  - Continue current treatment with Tucatinib  and Trastuzumab   - Plan for interval imaging to assess treatment response in 3 weeks; will reach out to patient regarding results    Elevated transaminases    Liver function tests showed slight elevation in transaminases on 8/14 but now normalized  (likely from increase tylenol  use, which he has stopped)  - Monitor liver function tests closely      Type I Diabetes Mellitus    He has Type I Diabetes Mellitus, managed with a Dexcom and Omnipod system. There are no acute issues related to diabetes management.      Patient and family member's questions answered to their satisfaction. Visit completed with the supervision of Dr. Sanoff and Madelin Ford, NP. She was available for consultation and in agreement with the plan unless specified in the addendum. This was high medical decision making on the basis of cancer and treatment with risk for complication requiring ongoing careful monitoring for severe toxicity.      I spent a total of 30 minutes in both face-to-face and non-face-to-face activities for this visit on the date of this encounter.       Sharrell Schmitt, AGNP-C  Nurse Practitioner - GI Medical Oncology       I was the precepting APP in the delivery of the service. Tammy A Triglianos, ANP  ______________________________________________________________    Interval History  Mr. Dipaola returns today for ongoing management of his rectal cancer with liver mets.  History of Present Illness  Max Stewart is a 53 year old male with metastatic HER2 positive colorectal cancer who presents for toxicity check on Tucatinib  + trastuzumab . He reports feeling well and without significant side effects - no N/V/D, BLE swelling, SOB, or rashes. He continues to be active at home and no limitations in his ADLs.     MEDICAL HISTORY  1. Type I DM- diagnosed at age 27, on insulin      SOCIAL HISTORY   Denies smoking and drugs. Drinks occasional alcohol. 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 He lives with his wife and two daughters     PHYSICAL EXAM  Vitals:    10/13/23 0917   BP: 137/78   Pulse: 57   Resp: 17   Temp: 36.4 ??C (97.5 ??F)   SpO2: 99%     GENERAL: well-developed, in no distress  PSYCH: full and appropriate range of affect with good insight and judgement  HEENT: NCAT, pupils equal, sclerae anicteric  EXT: warm and well perfused, no edema  SKIN: No rashes    OBJECTIVE DATA  Reviewed in EMR  Look good for ongoing treatment, LFTs back to normal     RADIOLOGY RESULTS   None today

## 2023-10-13 ENCOUNTER — Ambulatory Visit: Admit: 2023-10-13 | Discharge: 2023-10-14 | Payer: MEDICARE

## 2023-10-13 ENCOUNTER — Ambulatory Visit: Admit: 2023-10-13 | Discharge: 2023-10-13 | Payer: MEDICARE

## 2023-10-13 ENCOUNTER — Encounter: Admit: 2023-10-13 | Discharge: 2023-10-13 | Payer: MEDICARE

## 2023-10-13 DIAGNOSIS — Z79899 Other long term (current) drug therapy: Principal | ICD-10-CM

## 2023-10-13 DIAGNOSIS — R7989 Other specified abnormal findings of blood chemistry: Principal | ICD-10-CM

## 2023-10-13 DIAGNOSIS — C20 Malignant neoplasm of rectum: Principal | ICD-10-CM

## 2023-10-13 DIAGNOSIS — C7989 Secondary malignant neoplasm of other specified sites: Principal | ICD-10-CM

## 2023-10-13 DIAGNOSIS — Z09 Encounter for follow-up examination after completed treatment for conditions other than malignant neoplasm: Principal | ICD-10-CM

## 2023-10-13 DIAGNOSIS — E109 Type 1 diabetes mellitus without complications: Principal | ICD-10-CM

## 2023-10-13 LAB — COMPREHENSIVE METABOLIC PANEL
ALBUMIN: 3.6 g/dL (ref 3.4–5.0)
ALKALINE PHOSPHATASE: 103 U/L (ref 46–116)
ALT (SGPT): 21 U/L (ref 10–49)
ANION GAP: 11 mmol/L (ref 5–14)
AST (SGOT): 23 U/L (ref <=34)
BILIRUBIN TOTAL: 0.8 mg/dL (ref 0.3–1.2)
BLOOD UREA NITROGEN: 18 mg/dL (ref 9–23)
BUN / CREAT RATIO: 17
CALCIUM: 8.9 mg/dL (ref 8.7–10.4)
CHLORIDE: 104 mmol/L (ref 98–107)
CO2: 28.9 mmol/L (ref 20.0–31.0)
CREATININE: 1.04 mg/dL (ref 0.73–1.18)
EGFR CKD-EPI (2021) MALE: 85 mL/min/1.73m2 (ref >=60)
GLUCOSE RANDOM: 114 mg/dL (ref 70–179)
POTASSIUM: 4.3 mmol/L (ref 3.4–4.8)
PROTEIN TOTAL: 6.7 g/dL (ref 5.7–8.2)
SODIUM: 144 mmol/L (ref 135–145)

## 2023-10-13 LAB — CBC W/ AUTO DIFF
BASOPHILS ABSOLUTE COUNT: 0 10*9/L (ref 0.0–0.1)
BASOPHILS RELATIVE PERCENT: 0.5 %
EOSINOPHILS ABSOLUTE COUNT: 0.1 10*9/L (ref 0.0–0.5)
EOSINOPHILS RELATIVE PERCENT: 2.1 %
HEMATOCRIT: 41 % (ref 39.0–48.0)
HEMOGLOBIN: 14.1 g/dL (ref 12.9–16.5)
LYMPHOCYTES ABSOLUTE COUNT: 1.7 10*9/L (ref 1.1–3.6)
LYMPHOCYTES RELATIVE PERCENT: 37.9 %
MEAN CORPUSCULAR HEMOGLOBIN CONC: 34.2 g/dL (ref 32.0–36.0)
MEAN CORPUSCULAR HEMOGLOBIN: 29.8 pg (ref 25.9–32.4)
MEAN CORPUSCULAR VOLUME: 87 fL (ref 77.6–95.7)
MEAN PLATELET VOLUME: 9.7 fL (ref 6.8–10.7)
MONOCYTES ABSOLUTE COUNT: 0.3 10*9/L (ref 0.3–0.8)
MONOCYTES RELATIVE PERCENT: 7.1 %
NEUTROPHILS ABSOLUTE COUNT: 2.4 10*9/L (ref 1.8–7.8)
NEUTROPHILS RELATIVE PERCENT: 52.4 %
NUCLEATED RED BLOOD CELLS: 0 /100{WBCs} (ref <=4)
PLATELET COUNT: 153 10*9/L (ref 150–450)
RED BLOOD CELL COUNT: 4.71 10*12/L (ref 4.26–5.60)
RED CELL DISTRIBUTION WIDTH: 13.3 % (ref 12.2–15.2)
WBC ADJUSTED: 4.5 10*9/L (ref 3.6–11.2)

## 2023-10-13 LAB — CEA: CARCINOEMBRYONIC ANTIGEN: <2 ng/mL (ref 0.0–5.0)

## 2023-10-13 MED ADMIN — heparin, porcine (PF) 100 unit/mL injection 500 Units: 500 [IU] | INTRAVENOUS | @ 15:00:00 | Stop: 2023-10-13

## 2023-10-13 MED ADMIN — trastuzumab-qyyp (TRAZIMERA) 600 mg in sodium chloride (NS) 0.9 % 250 mL IVPB: 6 mg/kg | INTRAVENOUS | @ 15:00:00 | Stop: 2023-10-13

## 2023-10-13 NOTE — Unmapped (Signed)
 Clinical Support on 10/13/2023   Component Date Value Ref Range Status    Sodium 10/13/2023 144  135 - 145 mmol/L Final    Potassium 10/13/2023 4.3  3.4 - 4.8 mmol/L Final    Chloride 10/13/2023 104  98 - 107 mmol/L Final    CO2 10/13/2023 28.9  20.0 - 31.0 mmol/L Final    Anion Gap 10/13/2023 11  5 - 14 mmol/L Final    BUN 10/13/2023 18  9 - 23 mg/dL Final    Creatinine 90/95/7974 1.04  0.73 - 1.18 mg/dL Final    BUN/Creatinine Ratio 10/13/2023 17   Final    eGFR CKD-EPI (2021) Male 10/13/2023 85  >=60 mL/min/1.48m2 Final    eGFR calculated with CKD-EPI 2021 equation in accordance with [employer] and [employer] of Nephrology Task Force recommendations.    Glucose 10/13/2023 114  70 - 179 mg/dL Final    Calcium 90/95/7974 8.9  8.7 - 10.4 mg/dL Final    Albumin 90/95/7974 3.6  3.4 - 5.0 g/dL Final    Total Protein 10/13/2023 6.7  5.7 - 8.2 g/dL Final    Total Bilirubin 10/13/2023 0.8  0.3 - 1.2 mg/dL Final    AST 90/95/7974 23  <=34 U/L Final    ALT 10/13/2023 21  10 - 49 U/L Final    Alkaline Phosphatase 10/13/2023 103  46 - 116 U/L Final    WBC 10/13/2023 4.5  3.6 - 11.2 10*9/L Final    RBC 10/13/2023 4.71  4.26 - 5.60 10*12/L Final    HGB 10/13/2023 14.1  12.9 - 16.5 g/dL Final    HCT 90/95/7974 41.0  39.0 - 48.0 % Final    MCV 10/13/2023 87.0  77.6 - 95.7 fL Final    MCH 10/13/2023 29.8  25.9 - 32.4 pg Final    MCHC 10/13/2023 34.2  32.0 - 36.0 g/dL Final    RDW 90/95/7974 13.3  12.2 - 15.2 % Final    MPV 10/13/2023 9.7  6.8 - 10.7 fL Final    Platelet 10/13/2023 153  150 - 450 10*9/L Final    nRBC 10/13/2023 0  <=4 /100 WBCs Final    Neutrophils % 10/13/2023 52.4  % Final    Lymphocytes % 10/13/2023 37.9  % Final    Monocytes % 10/13/2023 7.1  % Final    Eosinophils % 10/13/2023 2.1  % Final    Basophils % 10/13/2023 0.5  % Final    Absolute Neutrophils 10/13/2023 2.4  1.8 - 7.8 10*9/L Final    Absolute Lymphocytes 10/13/2023 1.7  1.1 - 3.6 10*9/L Final    Absolute Monocytes 10/13/2023 0.3 0.3 - 0.8 10*9/L Final    Absolute Eosinophils 10/13/2023 0.1  0.0 - 0.5 10*9/L Final    Absolute Basophils 10/13/2023 0.0  0.0 - 0.1 10*9/L Final

## 2023-10-13 NOTE — Unmapped (Signed)
 Access:  PORT    Labs: Within parameters    Patient reported new or worsening symptoms (if yes, explanation needed): No    Provider Notified: N/A    Ok to Treat given: N/A    Dignicap: N/A    Treatment Administered : Trastuzumab  600mg      Patient Education Performed (if yes,explanation needed): No    Time Spent on Patient Education: 0 min    Pre-Chemotherapy Blood Return : Yes    Hypersensitivity Reaction (if yes, explanation needed): No    Post Chemotherapy Blood Return: Yes    Heparin  Locked: Yes    Dressing: band-aid at PORT deaccess site    Stable Discharge (if no explanation needed): Yes

## 2023-10-25 DIAGNOSIS — C20 Malignant neoplasm of rectum: Principal | ICD-10-CM

## 2023-10-27 DIAGNOSIS — C20 Malignant neoplasm of rectum: Principal | ICD-10-CM

## 2023-11-01 DIAGNOSIS — C20 Malignant neoplasm of rectum: Principal | ICD-10-CM

## 2023-11-03 ENCOUNTER — Ambulatory Visit: Admit: 2023-11-03 | Discharge: 2023-11-03 | Payer: PRIVATE HEALTH INSURANCE

## 2023-11-03 ENCOUNTER — Encounter: Admit: 2023-11-03 | Discharge: 2023-11-03 | Payer: PRIVATE HEALTH INSURANCE

## 2023-11-03 DIAGNOSIS — C20 Malignant neoplasm of rectum: Principal | ICD-10-CM

## 2023-11-03 LAB — COMPREHENSIVE METABOLIC PANEL
ALBUMIN: 3.5 g/dL (ref 3.4–5.0)
ALKALINE PHOSPHATASE: 116 U/L (ref 46–116)
ALT (SGPT): 38 U/L (ref 10–49)
ANION GAP: 12 mmol/L (ref 5–14)
AST (SGOT): 22 U/L (ref <=34)
BILIRUBIN TOTAL: 0.6 mg/dL (ref 0.3–1.2)
BLOOD UREA NITROGEN: 15 mg/dL (ref 9–23)
BUN / CREAT RATIO: 16
CALCIUM: 8.7 mg/dL (ref 8.7–10.4)
CHLORIDE: 102 mmol/L (ref 98–107)
CO2: 25.9 mmol/L (ref 20.0–31.0)
CREATININE: 0.95 mg/dL (ref 0.73–1.18)
EGFR CKD-EPI (2021) MALE: >90 mL/min/1.73m2 (ref >=60)
GLUCOSE RANDOM: 257 mg/dL — ABNORMAL HIGH (ref 70–179)
POTASSIUM: 4.7 mmol/L (ref 3.4–4.8)
PROTEIN TOTAL: 6.7 g/dL (ref 5.7–8.2)
SODIUM: 140 mmol/L (ref 135–145)

## 2023-11-03 LAB — CBC W/ AUTO DIFF
BASOPHILS ABSOLUTE COUNT: 0 10*9/L (ref 0.0–0.1)
BASOPHILS RELATIVE PERCENT: 0.5 %
EOSINOPHILS ABSOLUTE COUNT: 0.1 10*9/L (ref 0.0–0.5)
EOSINOPHILS RELATIVE PERCENT: 2.4 %
HEMATOCRIT: 40.1 % (ref 39.0–48.0)
HEMOGLOBIN: 14.1 g/dL (ref 12.9–16.5)
LYMPHOCYTES ABSOLUTE COUNT: 1.8 10*9/L (ref 1.1–3.6)
LYMPHOCYTES RELATIVE PERCENT: 36.6 %
MEAN CORPUSCULAR HEMOGLOBIN CONC: 35.1 g/dL (ref 32.0–36.0)
MEAN CORPUSCULAR HEMOGLOBIN: 30.2 pg (ref 25.9–32.4)
MEAN CORPUSCULAR VOLUME: 86 fL (ref 77.6–95.7)
MEAN PLATELET VOLUME: 9.9 fL (ref 6.8–10.7)
MONOCYTES ABSOLUTE COUNT: 0.3 10*9/L (ref 0.3–0.8)
MONOCYTES RELATIVE PERCENT: 5.9 %
NEUTROPHILS ABSOLUTE COUNT: 2.7 10*9/L (ref 1.8–7.8)
NEUTROPHILS RELATIVE PERCENT: 54.6 %
NUCLEATED RED BLOOD CELLS: 0 /100{WBCs} (ref <=4)
PLATELET COUNT: 153 10*9/L (ref 150–450)
RED BLOOD CELL COUNT: 4.67 10*12/L (ref 4.26–5.60)
RED CELL DISTRIBUTION WIDTH: 13.5 % (ref 12.2–15.2)
WBC ADJUSTED: 5 10*9/L (ref 3.6–11.2)

## 2023-11-03 LAB — CEA: CARCINOEMBRYONIC ANTIGEN: <2 ng/mL (ref 0.0–5.0)

## 2023-11-03 MED ADMIN — heparin, porcine (PF) 100 unit/mL injection 500 Units: 500 [IU] | INTRAVENOUS | @ 18:00:00 | Stop: 2023-11-03

## 2023-11-03 MED ADMIN — trastuzumab-qyyp (TRAZIMERA) 600 mg in sodium chloride (NS) 0.9 % 250 mL IVPB: 6 mg/kg | INTRAVENOUS | @ 18:00:00 | Stop: 2023-11-03

## 2023-11-03 NOTE — Unmapped (Signed)
 Access:  PORT    Labs: N/A parameters    Patient reported new or worsening symptoms (if yes, explanation needed): No    Treatment Administered : chemo    Pre-Chemotherapy Blood Return : Yes    Hypersensitivity Reaction (if yes, explanation needed): No    Post Chemotherapy Blood Return: Yes    Heparin  Locked: Yes    Dressing: band aid    Stable Discharge (if no explanation needed): Yes

## 2023-11-03 NOTE — Unmapped (Signed)
 The [facility] has made a second and final attempt to reach this patient to refill the following medication:tucatinib : TUKYSA  150 mg tablet and tucatinib : TUKYSA  50 mg tablet.      We have been unable to leave messages on the following phone numbers: [phone], have sent a MyChart message, have sent a text message to the following phone numbers: [phone], and have sent a Mychart questionnaire..    Dates contacted: 10/24/2023 and 11/03/2023  Last scheduled delivery: 10/05/2023    The patient may be at risk of non-compliance with this medication. The patient should call the [facility] at [phone]  Option 4, then Option 1: Oncology to refill medication.    Lucie HERO Adden Strout   [facility] On[facility]ologist

## 2023-11-03 NOTE — Unmapped (Signed)
 Clinical Support on 11/03/2023   Component Date Value Ref Range Status    Sodium 11/03/2023 140  135 - 145 mmol/L Final    Potassium 11/03/2023 4.7  3.4 - 4.8 mmol/L Final    Chloride 11/03/2023 102  98 - 107 mmol/L Final    CO2 11/03/2023 25.9  20.0 - 31.0 mmol/L Final    Anion Gap 11/03/2023 12  5 - 14 mmol/L Final    BUN 11/03/2023 15  9 - 23 mg/dL Final    Creatinine 90/74/7974 0.95  0.73 - 1.18 mg/dL Final    BUN/Creatinine Ratio 11/03/2023 16   Final    eGFR CKD-EPI (2021) Male 11/03/2023 >90  >=60 mL/min/1.58m2 Final    eGFR calculated with CKD-EPI 2021 equation in accordance with [employer] and [employer] of Nephrology Task Force recommendations.    Glucose 11/03/2023 257 (H)  70 - 179 mg/dL Final    Calcium 90/74/7974 8.7  8.7 - 10.4 mg/dL Final    Albumin 90/74/7974 3.5  3.4 - 5.0 g/dL Final    Total Protein 11/03/2023 6.7  5.7 - 8.2 g/dL Final    Total Bilirubin 11/03/2023 0.6  0.3 - 1.2 mg/dL Final    AST 90/74/7974 22  <=34 U/L Final    ALT 11/03/2023 38  10 - 49 U/L Final    Alkaline Phosphatase 11/03/2023 116  46 - 116 U/L Final    WBC 11/03/2023 5.0  3.6 - 11.2 10*9/L Final    RBC 11/03/2023 4.67  4.26 - 5.60 10*12/L Final    HGB 11/03/2023 14.1  12.9 - 16.5 g/dL Final    HCT 90/74/7974 40.1  39.0 - 48.0 % Final    MCV 11/03/2023 86.0  77.6 - 95.7 fL Final    MCH 11/03/2023 30.2  25.9 - 32.4 pg Final    MCHC 11/03/2023 35.1  32.0 - 36.0 g/dL Final    RDW 90/74/7974 13.5  12.2 - 15.2 % Final    MPV 11/03/2023 9.9  6.8 - 10.7 fL Final    Platelet 11/03/2023 153  150 - 450 10*9/L Final    nRBC 11/03/2023 0  <=4 /100 WBCs Final    Neutrophils % 11/03/2023 54.6  % Final    Lymphocytes % 11/03/2023 36.6  % Final    Monocytes % 11/03/2023 5.9  % Final    Eosinophils % 11/03/2023 2.4  % Final    Basophils % 11/03/2023 0.5  % Final    Absolute Neutrophils 11/03/2023 2.7  1.8 - 7.8 10*9/L Final    Absolute Lymphocytes 11/03/2023 1.8  1.1 - 3.6 10*9/L Final    Absolute Monocytes 11/03/2023 0.3  0.3 - 0.8 10*9/L Final    Absolute Eosinophils 11/03/2023 0.1  0.0 - 0.5 10*9/L Final    Absolute Basophils 11/03/2023 0.0  0.0 - 0.1 10*9/L Final

## 2023-11-04 ENCOUNTER — Inpatient Hospital Stay: Admit: 2023-11-04 | Discharge: 2023-11-04 | Payer: PRIVATE HEALTH INSURANCE

## 2023-11-04 MED ADMIN — gadopiclenol (ELUCIREM,VUEWAY) injection 10 mL: 10 mL | INTRAVENOUS | @ 19:00:00 | Stop: 2023-11-04

## 2023-11-09 DIAGNOSIS — C20 Malignant neoplasm of rectum: Principal | ICD-10-CM

## 2023-11-11 NOTE — Unmapped (Signed)
 [facility] Refill Coordination Note    Specialty Medication(s) to be Shipped:   Hematology/Oncology: Tukysa  150mg  and 50mg     Other medication(s) to be shipped: No additional medications requested for fill at this time    Specialty Medications not needed at this time: N/A     Max Stewart, DOB: [birth year]  Phone: [phone] (home) [phone] (work)      All above HIPAA information was verified with patient.     Was a Nurse, learning disability used for this call? No    Completed refill call assessment today to schedule patient's medication shipment from the [facility]  [phone]).  All relevant notes have been reviewed.     Specialty medication(s) and dose(s) confirmed: Regimen is correct and unchanged.   Changes to medications: Max Stewart reports no changes at this time.  Changes to insurance: No  New side effects reported not previously addressed with a pharmacist or physician: None reported  Questions for the pharmacist: No    Confirmed patient received a Conservation officer, historic buildings and a Surveyor, mining with first shipment. The patient will receive a drug information handout for each medication shipped and additional FDA Medication Guides as required.       DISEASE/MEDICATION-SPECIFIC INFORMATION        N/A    SPECIALTY MEDICATION ADHERENCE     Medication Adherence    Patient reported X missed doses in the last month: 0  Specialty Medication: tukysa  150mg   Patient is on additional specialty medications: Yes  Additional Specialty Medications: tukysa  50mg   Patient Reported Additional Medication X Missed Doses in the Last Month: 0  Patient is on more than two specialty medications: No              Were doses missed due to medication being on hold? No      tucatinib : TUKYSA  150 mg tablet: 2 days of medicine on hand     tucatinib : TUKYSA  50 mg tablet: 2 days of medicine on hand       REFERRAL TO PHARMACIST     Referral to the pharmacist: Not needed      SHIPPING Shipping address confirmed in Epic.     Cost and Payment: Patient has a $0 copay, payment information is not required.    Delivery Scheduled: Yes, Expected medication delivery date: 10.7.25. Have to order Tukysa  50mg  and pt prefer to have both meds ship together     Medication will be delivered via Same Day Courier to the prescription address in Epic WAM.    Max Stewart   [facility]  Specialty Technician

## 2023-11-15 MED FILL — TUKYSA 50 MG TABLET: ORAL | 30 days supply | Qty: 120 | Fill #1

## 2023-11-15 MED FILL — TUKYSA 150 MG TABLET: ORAL | 30 days supply | Qty: 60 | Fill #1

## 2023-11-22 DIAGNOSIS — C20 Malignant neoplasm of rectum: Principal | ICD-10-CM

## 2023-11-22 NOTE — Unmapped (Signed)
 [name]     CONSULTING PROVIDERS  Dr. Franky Sprinkles - Radiation Oncology  Dr. Morene Freed, [facility] Surgical Oncology   Dr. Madelynn, [facility] Surgical Oncology  ____________________________________________________________________    CANCER HISTORY  Diagnosis 10/12/17 metastatic Rectal Adenocarcinoma with synchronous liver mets, work-up with a 4cm rectal mass and 5 metastatic hepatic lesions    11/2017-03/2018. FOLFOX x 8  05/2018 chemoradiotherapy to primary.   -Clinical complete response in liver and rectum, managed with Watch and Wait approach  03/2019 recurrent disease in rectum   05/2019 proctectomy with coloanal anastomosis, liver wedge resection (seg 3): liver with fibrosis, rectum invasive mod to poorly differentiated adenocarcinoma, 3 cm; into muscularis propria, no LVI, tumor at distal margin in area of tearing superceded by additional specimen, 0/16 nodes, two with prominent treatment effect. YpT2N0.  - complicated by abdominal abscess with MRSA  08/02/19 closure of diverting ileostomy  02/2020 2 sites of recurrence disease in liver adjacent to GB fossa  04/14/20 seg 4b wedge resection: two foci of adenocarcinoma c/w colorectal primary. Margin +; node negative   01/2021 SBRT 5000cGy in 79fx to met near GB fossa  06/2021 -09/2021 pelvic + possible right abd wall recurrence FOLFOX x 6  11/05/2021 tucatinib -trastuzumab  start [baseline echo nl]  - c1 LFTs 4x ULN. Improved quickly, resumed at tucatinib  250 bid    MOLECULAR PROFILE   TEMPUS from 06/2021 rectal mass biopsy:  HER2 amplified  TP53 and APC mutation  RAS/RAF WT  TMB 6.3, MSS    Treatment plan:  tucatinib  + trastuzumab   ___________________________________________________________________    ASSESSMENT  1. 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 Stable    2. Type I DM- controlled on insulin  pump, no complications from DM  3. Elevated transaminases, resolved CTM  4. ECOG 0    RECOMMENDATIONS  1. Continue Tucatinib  250 mg BID + trastuzumab  without dose adjustment  2. RTC 3 weeks treatment; 6 weeks treatment; 9 weeks for scans + visit + treatment       RECOMMENDATIONS  Reviewed MRI imaging that was reassuring for disease stability. [name] continues to tolerate tucatinib +trastuzumab  well. Dry skin/crusting under his chin may be ingrown hair vs tucatinib  toxicity although would be atypical for tucatinib -associated dermatologic toxicity. Since it is just mildly bothersome, will continue to monitor. Given excellent tolerance and upcoming holidays, we agreed to have him proceed with next two infusions without provider visit. Will keep an eye on lab work to ensure no dose adjustments are needed. Plan for repeat MRI abd/pelvis + CT chest wo in 9 weeks.     Patient and family member's questions answered to their satisfaction. This was high medical decision making on the basis of cancer and treatment with risk for complication requiring ongoing careful monitoring for severe toxicity.      I spent a total of 30 minutes in both face-to-face and non-face-to-face activities for this visit on the date of this encounter.       Sharrell Schmitt, AGNP-C  Nurse Practitioner - GI Medical Oncology       ________________________________________________________    Interval History  Mr. Drudge returns today for ongoing management of his rectal cancer with liver mets.  History of Present Illness  Max Stewart is a 53 year old male with metastatic HER2 positive colorectal cancer who presents for toxicity check on Tucatinib  + trastuzumab . He reports feeling well and without significant side effects since last visit 6 weeks ago. No changes in  bowel frequency, fever/chills, SON, or BLE swelling. He endorses having some dry/sore spots under his chin that he initially thought were ingrown hairs; these spots have been present for months, he forgot to bring it up at previous appointments since it was minor. No hand/feet discoloration or skin crackling; no discrete rashes. Energy level and appetite stable.       MEDICAL HISTORY  1. Type I DM- diagnosed at age 31, on insulin      SOCIAL HISTORY   Denies smoking and drugs. Drinks occasional alcohol. 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 No drainage or erythema. Otherwise, no rashes    OBJECTIVE DATA  Reviewed in EMR - Plts slightly low. Otherwise labs are appropriate for treatment today/no dose adjustments needed      RADIOLOGY RESULTS (personally reviewed by myself and Dr. Orvis; our interpretation is as below)  Stable imaging without new areas of disease; omental nodule smaller

## 2023-11-24 ENCOUNTER — Ambulatory Visit: Admit: 2023-11-24 | Discharge: 2023-11-25 | Payer: PRIVATE HEALTH INSURANCE

## 2023-11-24 ENCOUNTER — Encounter: Admit: 2023-11-24 | Discharge: 2023-11-25 | Payer: PRIVATE HEALTH INSURANCE

## 2023-11-24 DIAGNOSIS — C20 Malignant neoplasm of rectum: Principal | ICD-10-CM

## 2023-11-24 LAB — COMPREHENSIVE METABOLIC PANEL
ALBUMIN: 3.6 g/dL (ref 3.4–5.0)
ALKALINE PHOSPHATASE: 113 U/L (ref 46–116)
ALT (SGPT): 32 U/L (ref 10–49)
ANION GAP: 11 mmol/L (ref 5–14)
AST (SGOT): 25 U/L (ref <=34)
BILIRUBIN TOTAL: 0.8 mg/dL (ref 0.3–1.2)
BLOOD UREA NITROGEN: 20 mg/dL (ref 9–23)
BUN / CREAT RATIO: 20
CALCIUM: 8.8 mg/dL (ref 8.7–10.4)
CHLORIDE: 101 mmol/L (ref 98–107)
CO2: 27 mmol/L (ref 20.0–31.0)
CREATININE: 0.98 mg/dL (ref 0.73–1.18)
EGFR CKD-EPI (2021) MALE: >90 mL/min/1.73m2 (ref >=60)
GLUCOSE RANDOM: 359 mg/dL — ABNORMAL HIGH (ref 70–179)
POTASSIUM: 4.5 mmol/L (ref 3.4–4.8)
PROTEIN TOTAL: 6.7 g/dL (ref 5.7–8.2)
SODIUM: 139 mmol/L (ref 135–145)

## 2023-11-24 LAB — CBC W/ AUTO DIFF
BASOPHILS ABSOLUTE COUNT: 0 10*9/L (ref 0.0–0.1)
BASOPHILS RELATIVE PERCENT: 0.5 %
EOSINOPHILS ABSOLUTE COUNT: 0.1 10*9/L (ref 0.0–0.5)
EOSINOPHILS RELATIVE PERCENT: 2.5 %
HEMATOCRIT: 40.8 % (ref 39.0–48.0)
HEMOGLOBIN: 13.9 g/dL (ref 12.9–16.5)
LYMPHOCYTES ABSOLUTE COUNT: 1.6 10*9/L (ref 1.1–3.6)
LYMPHOCYTES RELATIVE PERCENT: 29.7 %
MEAN CORPUSCULAR HEMOGLOBIN CONC: 34.2 g/dL (ref 32.0–36.0)
MEAN CORPUSCULAR HEMOGLOBIN: 29.6 pg (ref 25.9–32.4)
MEAN CORPUSCULAR VOLUME: 86.6 fL (ref 77.6–95.7)
MEAN PLATELET VOLUME: 9.5 fL (ref 6.8–10.7)
MONOCYTES ABSOLUTE COUNT: 0.3 10*9/L (ref 0.3–0.8)
MONOCYTES RELATIVE PERCENT: 5.2 %
NEUTROPHILS ABSOLUTE COUNT: 3.3 10*9/L (ref 1.8–7.8)
NEUTROPHILS RELATIVE PERCENT: 62.1 %
NUCLEATED RED BLOOD CELLS: 0 /100{WBCs} (ref <=4)
PLATELET COUNT: 143 10*9/L — ABNORMAL LOW (ref 150–450)
RED BLOOD CELL COUNT: 4.71 10*12/L (ref 4.26–5.60)
RED CELL DISTRIBUTION WIDTH: 13.8 % (ref 12.2–15.2)
WBC ADJUSTED: 5.3 10*9/L (ref 3.6–11.2)

## 2023-11-24 LAB — CEA: CARCINOEMBRYONIC ANTIGEN: <2 ng/mL (ref 0.0–5.0)

## 2023-11-24 MED ADMIN — trastuzumab-qyyp (TRAZIMERA) 600 mg in sodium chloride (NS) 0.9 % 250 mL IVPB: 6 mg/kg | INTRAVENOUS | @ 18:00:00 | Stop: 2023-11-24

## 2023-11-24 MED ADMIN — heparin, porcine (PF) 100 unit/mL injection 500 Units: 500 [IU] | INTRAVENOUS | @ 19:00:00 | Stop: 2023-11-24

## 2023-11-24 NOTE — Unmapped (Signed)
 Clinical Support on 11/24/2023   Component Date Value Ref Range Status    Sodium 11/24/2023 139  135 - 145 mmol/L Final    Potassium 11/24/2023 4.5  3.4 - 4.8 mmol/L Final    Chloride 11/24/2023 101  98 - 107 mmol/L Final    CO2 11/24/2023 27.0  20.0 - 31.0 mmol/L Final    Anion Gap 11/24/2023 11  5 - 14 mmol/L Final    BUN 11/24/2023 20  9 - 23 mg/dL Final    Creatinine 89/83/7974 0.98  0.73 - 1.18 mg/dL Final    BUN/Creatinine Ratio 11/24/2023 20   Final    eGFR CKD-EPI (2021) Male 11/24/2023 >90  >=60 mL/min/1.65m2 Final    eGFR calculated with CKD-EPI 2021 equation in accordance with [employer] and [employer] of Nephrology Task Force recommendations.    Glucose 11/24/2023 359 (H)  70 - 179 mg/dL Final    Calcium 89/83/7974 8.8  8.7 - 10.4 mg/dL Final    Albumin 89/83/7974 3.6  3.4 - 5.0 g/dL Final    Total Protein 11/24/2023 6.7  5.7 - 8.2 g/dL Final    Total Bilirubin 11/24/2023 0.8  0.3 - 1.2 mg/dL Final    AST 89/83/7974 25  <=34 U/L Final    ALT 11/24/2023 32  10 - 49 U/L Final    Alkaline Phosphatase 11/24/2023 113  46 - 116 U/L Final    WBC 11/24/2023 5.3  3.6 - 11.2 10*9/L Final    RBC 11/24/2023 4.71  4.26 - 5.60 10*12/L Final    HGB 11/24/2023 13.9  12.9 - 16.5 g/dL Final    HCT 89/83/7974 40.8  39.0 - 48.0 % Final    MCV 11/24/2023 86.6  77.6 - 95.7 fL Final    MCH 11/24/2023 29.6  25.9 - 32.4 pg Final    MCHC 11/24/2023 34.2  32.0 - 36.0 g/dL Final    RDW 89/83/7974 13.8  12.2 - 15.2 % Final    MPV 11/24/2023 9.5  6.8 - 10.7 fL Final    Platelet 11/24/2023 143 (L)  150 - 450 10*9/L Final    nRBC 11/24/2023 0  <=4 /100 WBCs Final    Neutrophils % 11/24/2023 62.1  % Final    Lymphocytes % 11/24/2023 29.7  % Final    Monocytes % 11/24/2023 5.2  % Final    Eosinophils % 11/24/2023 2.5  % Final    Basophils % 11/24/2023 0.5  % Final    Absolute Neutrophils 11/24/2023 3.3  1.8 - 7.8 10*9/L Final    Absolute Lymphocytes 11/24/2023 1.6  1.1 - 3.6 10*9/L Final    Absolute Monocytes 11/24/2023 0.3  0.3 - 0.8 10*9/L Final    Absolute Eosinophils 11/24/2023 0.1  0.0 - 0.5 10*9/L Final    Absolute Basophils 11/24/2023 0.0  0.0 - 0.1 10*9/L Final

## 2023-11-24 NOTE — Unmapped (Signed)
 Access:  PORT    Labs: Within parameters    Patient reported new or worsening symptoms (if yes, explanation needed): No    Provider Notified: N/A    Ok to Treat given: N/A    Dignicap: N/A    Treatment Administered : trastuzumab -qyyp (TRAZIMERA ) 600 mg     Patient Education Performed (if yes,explanation needed): No    Time Spent on Patient Education: 0 min    Pre-Chemotherapy Blood Return : Yes    Hypersensitivity Reaction (if yes, explanation needed): No    Post Chemotherapy Blood Return: Yes    Heparin  Locked: Yes    Dressing: band-aid applied to port de-access site    Stable Discharge (if no explanation needed): Yes

## 2023-11-29 DIAGNOSIS — C20 Malignant neoplasm of rectum: Principal | ICD-10-CM

## 2023-12-08 DIAGNOSIS — C20 Malignant neoplasm of rectum: Principal | ICD-10-CM

## 2023-12-09 DIAGNOSIS — C20 Malignant neoplasm of rectum: Principal | ICD-10-CM

## 2023-12-09 NOTE — Progress Notes (Signed)
 [facility] Refill Coordination Note    Specialty Medication(s) to be Shipped:   Hematology/Oncology: Tukysa  50mg  and 150mg     Other medication(s) to be shipped: No additional medications requested for fill at this time    Specialty Medications not needed at this time: N/A     Max Stewart, DOB: 11/18/1970  Phone: [phone] (home) [phone] (work)      All above HIPAA information was verified with patient.     Was a nurse, learning disability used for this call? No    Completed refill call assessment today to schedule patient's medication shipment from the [facility]  [phone]).  All relevant notes have been reviewed.     Specialty medication(s) and dose(s) confirmed: Regimen is correct and unchanged.   Changes to medications: Max Stewart reports no changes at this time.  Changes to insurance: No  New side effects reported not previously addressed with a pharmacist or physician: None reported  Questions for the pharmacist: No    Confirmed patient received a Conservation Officer, Historic Buildings and a Surveyor, Mining with first shipment. The patient will receive a drug information handout for each medication shipped and additional FDA Medication Guides as required.       DISEASE/MEDICATION-SPECIFIC INFORMATION        N/A    SPECIALTY MEDICATION ADHERENCE     Medication Adherence    Patient reported X missed doses in the last month: 0  Specialty Medication: Tukysa  150mg   Patient is on additional specialty medications: Yes  Additional Specialty Medications: Tukysa  50mg   Patient Reported Additional Medication X Missed Doses in the Last Month: 0  Patient is on more than two specialty medications: No  Informant: patient     Were doses missed due to medication being on hold? No    Tukysa   50 mg: 5 days of medicine on hand   Tukysa   150 mg: 5 days of medicine on hand       REFERRAL TO PHARMACIST     Referral to the pharmacist: Not needed      [facility]     Shipping address confirmed in Epic.     Cost and Payment: Unable to determine copay at this time as the prescription requires a prior authorization/financial assistance. Patient is aware that shipment will be held until copay has been approved and payment information collected, if needed.    Delivery Scheduled: Yes, Expected medication delivery date: 12/13/23.     Medication will be delivered via Next Day Courier to the prescription address in Epic [place].    Max Stewart   [facility]  Specialty Technician

## 2023-12-12 MED FILL — TUKYSA 50 MG TABLET: ORAL | 30 days supply | Qty: 120 | Fill #2

## 2023-12-12 MED FILL — TUKYSA 150 MG TABLET: ORAL | 30 days supply | Qty: 60 | Fill #2

## 2023-12-15 ENCOUNTER — Ambulatory Visit: Admit: 2023-12-15 | Discharge: 2023-12-15 | Payer: PRIVATE HEALTH INSURANCE

## 2023-12-15 ENCOUNTER — Encounter: Admit: 2023-12-15 | Discharge: 2023-12-15 | Payer: PRIVATE HEALTH INSURANCE

## 2023-12-15 DIAGNOSIS — C20 Malignant neoplasm of rectum: Principal | ICD-10-CM

## 2023-12-15 LAB — COMPREHENSIVE METABOLIC PANEL
ALBUMIN: 3.7 g/dL (ref 3.4–5.0)
ALKALINE PHOSPHATASE: 137 U/L — ABNORMAL HIGH (ref 46–116)
ALT (SGPT): 356 U/L — ABNORMAL HIGH (ref 10–49)
ANION GAP: 12 mmol/L (ref 5–14)
AST (SGOT): 467 U/L — ABNORMAL HIGH (ref <=34)
BILIRUBIN TOTAL: 0.8 mg/dL (ref 0.3–1.2)
BLOOD UREA NITROGEN: 24 mg/dL — ABNORMAL HIGH (ref 9–23)
BUN / CREAT RATIO: 21
CALCIUM: 8.9 mg/dL (ref 8.7–10.4)
CHLORIDE: 103 mmol/L (ref 98–107)
CO2: 27.8 mmol/L (ref 20.0–31.0)
CREATININE: 1.17 mg/dL (ref 0.73–1.18)
EGFR CKD-EPI (2021) MALE: 74 mL/min/1.73m2 (ref >=60)
GLUCOSE RANDOM: 166 mg/dL (ref 70–179)
POTASSIUM: 4.3 mmol/L (ref 3.4–4.8)
PROTEIN TOTAL: 6.8 g/dL (ref 5.7–8.2)
SODIUM: 143 mmol/L (ref 135–145)

## 2023-12-15 LAB — CBC W/ AUTO DIFF
BASOPHILS ABSOLUTE COUNT: 0 10*9/L (ref 0.0–0.1)
BASOPHILS RELATIVE PERCENT: 0.6 %
EOSINOPHILS ABSOLUTE COUNT: 0.1 10*9/L (ref 0.0–0.5)
EOSINOPHILS RELATIVE PERCENT: 1.8 %
HEMATOCRIT: 41.9 % (ref 39.0–48.0)
HEMOGLOBIN: 14.4 g/dL (ref 12.9–16.5)
LYMPHOCYTES ABSOLUTE COUNT: 1.7 10*9/L (ref 1.1–3.6)
LYMPHOCYTES RELATIVE PERCENT: 33.9 %
MEAN CORPUSCULAR HEMOGLOBIN CONC: 34.4 g/dL (ref 32.0–36.0)
MEAN CORPUSCULAR HEMOGLOBIN: 29.8 pg (ref 25.9–32.4)
MEAN CORPUSCULAR VOLUME: 86.6 fL (ref 77.6–95.7)
MEAN PLATELET VOLUME: 9.9 fL (ref 6.8–10.7)
MONOCYTES ABSOLUTE COUNT: 0.3 10*9/L (ref 0.3–0.8)
MONOCYTES RELATIVE PERCENT: 6.9 %
NEUTROPHILS ABSOLUTE COUNT: 2.9 10*9/L (ref 1.8–7.8)
NEUTROPHILS RELATIVE PERCENT: 56.8 %
NUCLEATED RED BLOOD CELLS: 0 /100{WBCs} (ref <=4)
PLATELET COUNT: 170 10*9/L (ref 150–450)
RED BLOOD CELL COUNT: 4.83 10*12/L (ref 4.26–5.60)
RED CELL DISTRIBUTION WIDTH: 14.2 % (ref 12.2–15.2)
WBC ADJUSTED: 5.1 10*9/L (ref 3.6–11.2)

## 2023-12-15 LAB — CEA: CARCINOEMBRYONIC ANTIGEN: <2 ng/mL (ref 0.0–5.0)

## 2023-12-15 MED ADMIN — trastuzumab-qyyp (TRAZIMERA) 600 mg in sodium chloride (NS) 0.9 % 250 mL IVPB: 6 mg/kg | INTRAVENOUS | @ 20:00:00 | Stop: 2023-12-15

## 2023-12-15 MED ADMIN — heparin, porcine (PF) 100 unit/mL injection 500 Units: 500 [IU] | INTRAVENOUS | @ 20:00:00 | Stop: 2023-12-15

## 2023-12-15 NOTE — Progress Notes (Signed)
 Access:  PORT    Labs: N/A parameters    Patient reported new or worsening symptoms (if yes, explanation needed): No    Provider Notified: N/A    Ok to Treat given: N/A    Dignicap: N/A    Treatment Administered : chemotherapy-- trastuzumab  infusion    Patient Education Performed (if yes,explanation needed): No    Pre-Chemotherapy Blood Return : Yes    Hypersensitivity Reaction (if yes, explanation needed): No    Post Chemotherapy Blood Return: Yes    Heparin  Locked: Yes    Dressing: gauze/Band-Aid    Stable Discharge (if no explanation needed): Yes

## 2024-01-03 DIAGNOSIS — C20 Malignant neoplasm of rectum: Principal | ICD-10-CM

## 2024-01-03 NOTE — Progress Notes (Signed)
 [facility] Delivery Pharm[facility] Refill Coordination Note    Max Stewart, DOB: 01-18-1971  Phone: [phone] (home) [phone] (work)      All above HIPAA information was verified with patient.         01/03/2024    11:14 AM   Specialty Rx Medication Refill Questionnaire   Which Medications would you like refilled and shipped? Tukysa    Please list all current allergies: Shellfish cipro   Have you missed any doses in the last 30 days? No   Have you had any changes to your medication(s) since your last refill? No   How much of each medication do you have remaining at home? (eg. number of tablets, injections, etc.) 1 week   Have you experienced any side effects in the last 30 days? No   Please enter the full address (street address, city, state, zip code) where you would like your medication(s) to be delivered to. [address] , [place] [address]   Please specify on which day you would like your medication(s) to arrive. Note: if you need your medication(s) within 3 days, please call the pharmacy to schedule your order at [phone]  01/10/2024   Has your insurance changed since your last refill? No   Would you like a pharmacist to call you to discuss your medication(s)? No   Do you require a signature for your package? (Note: if we are billing Medicare Part B or your order contains a controlled substance, we will require a signature) No   I have been provided my out of pocket cost for my medication and approve the pharmacy to charge the amount to my credit card on file. Yes         Completed refill call assessment today to schedule patient's medication shipment from the [facility] and [facility] Delivery Pharm[facility] [phone]).  All relevant notes have been reviewed.       Confirmed patient received a Conservation Officer, Historic Buildings and a Surveyor, Mining with first shipment. The patient will receive a drug information handout for each medication shipped and additional FDA Medication Guides as required.         REFERRAL TO PHARMACIST     Referral to the pharmacist: Not needed      [facility]     Shipping address confirmed in Epic.     Delivery Scheduled: Yes, Expected medication delivery date: 12/2.     Medication will be delivered via Next Day Courier to the prescription address in Epic [place].    Dorothyann JAYSON Lands   [facility] Specialty Technician

## 2024-01-04 ENCOUNTER — Encounter: Admit: 2024-01-04 | Discharge: 2024-01-05 | Payer: PRIVATE HEALTH INSURANCE

## 2024-01-04 DIAGNOSIS — C20 Malignant neoplasm of rectum: Principal | ICD-10-CM

## 2024-01-04 LAB — CBC W/ AUTO DIFF
BASOPHILS ABSOLUTE COUNT: 0 10*9/L (ref 0.0–0.1)
BASOPHILS RELATIVE PERCENT: 0.7 %
EOSINOPHILS ABSOLUTE COUNT: 0.1 10*9/L (ref 0.0–0.5)
EOSINOPHILS RELATIVE PERCENT: 2.4 %
HEMATOCRIT: 41.8 % (ref 39.0–48.0)
HEMOGLOBIN: 14.3 g/dL (ref 12.9–16.5)
LYMPHOCYTES ABSOLUTE COUNT: 1.6 10*9/L (ref 1.1–3.6)
LYMPHOCYTES RELATIVE PERCENT: 33.5 %
MEAN CORPUSCULAR HEMOGLOBIN CONC: 34.2 g/dL (ref 32.0–36.0)
MEAN CORPUSCULAR HEMOGLOBIN: 29.5 pg (ref 25.9–32.4)
MEAN CORPUSCULAR VOLUME: 86.2 fL (ref 77.6–95.7)
MEAN PLATELET VOLUME: 9.9 fL (ref 6.8–10.7)
MONOCYTES ABSOLUTE COUNT: 0.4 10*9/L (ref 0.3–0.8)
MONOCYTES RELATIVE PERCENT: 7.7 %
NEUTROPHILS ABSOLUTE COUNT: 2.7 10*9/L (ref 1.8–7.8)
NEUTROPHILS RELATIVE PERCENT: 55.7 %
NUCLEATED RED BLOOD CELLS: 0 /100{WBCs} (ref <=4)
PLATELET COUNT: 154 10*9/L (ref 150–450)
RED BLOOD CELL COUNT: 4.84 10*12/L (ref 4.26–5.60)
RED CELL DISTRIBUTION WIDTH: 13.9 % (ref 12.2–15.2)
WBC ADJUSTED: 4.8 10*9/L (ref 3.6–11.2)

## 2024-01-04 LAB — COMPREHENSIVE METABOLIC PANEL
ALBUMIN: 3.5 g/dL (ref 3.4–5.0)
ALKALINE PHOSPHATASE: 111 U/L (ref 46–116)
ALT (SGPT): 27 U/L (ref 10–49)
ANION GAP: 12 mmol/L (ref 5–14)
AST (SGOT): 22 U/L (ref <=34)
BILIRUBIN TOTAL: 0.6 mg/dL (ref 0.3–1.2)
BLOOD UREA NITROGEN: 18 mg/dL (ref 9–23)
BUN / CREAT RATIO: 17
CALCIUM: 8.5 mg/dL — ABNORMAL LOW (ref 8.7–10.4)
CHLORIDE: 104 mmol/L (ref 98–107)
CO2: 26.5 mmol/L (ref 20.0–31.0)
CREATININE: 1.03 mg/dL (ref 0.73–1.18)
EGFR CKD-EPI (2021) MALE: 86 mL/min/1.73m2 (ref >=60)
GLUCOSE RANDOM: 132 mg/dL (ref 70–179)
POTASSIUM: 3.9 mmol/L (ref 3.4–4.8)
PROTEIN TOTAL: 6.5 g/dL (ref 5.7–8.2)
SODIUM: 142 mmol/L (ref 135–145)

## 2024-01-04 LAB — CEA: CARCINOEMBRYONIC ANTIGEN: <2 ng/mL (ref 0.0–5.0)

## 2024-01-04 MED ADMIN — heparin, porcine (PF) 100 unit/mL injection 500 Units: 500 [IU] | INTRAVENOUS | @ 15:00:00 | Stop: 2024-01-04

## 2024-01-04 MED ADMIN — trastuzumab-qyyp (TRAZIMERA) 600 mg in sodium chloride (NS) 0.9 % 250 mL IVPB: 6 mg/kg | INTRAVENOUS | @ 14:00:00 | Stop: 2024-01-04

## 2024-01-04 NOTE — Progress Notes (Signed)
 Access:  PORT    Labs: N/A parameters    Patient reported new or worsening symptoms (if yes, explanation needed): No    Provider Notified: N/A    Ok to Treat given: N/A    Dignicap: N/A    Treatment Administered : chemotherapy-- trastuzumab     Patient Education Performed (if yes,explanation needed): No    Pre-Chemotherapy Blood Return : Yes    Hypersensitivity Reaction (if yes, explanation needed): No    Post Chemotherapy Blood Return: Yes    Heparin  Locked: Yes    Dressing: gauze/Band-Aid dressing to port site    Stable Discharge (if no explanation needed): Yes

## 2024-01-09 MED FILL — TUKYSA 50 MG TABLET: ORAL | 30 days supply | Qty: 120 | Fill #3

## 2024-01-09 MED FILL — TUKYSA 150 MG TABLET: ORAL | 30 days supply | Qty: 60 | Fill #3

## 2024-01-26 ENCOUNTER — Ambulatory Visit
Admit: 2024-01-26 | Discharge: 2024-01-26 | Payer: MEDICARE | Attending: Hematology & Oncology | Primary: Hematology & Oncology

## 2024-01-26 ENCOUNTER — Encounter: Admit: 2024-01-26 | Discharge: 2024-01-26 | Payer: MEDICARE

## 2024-01-26 ENCOUNTER — Inpatient Hospital Stay: Admit: 2024-01-26 | Discharge: 2024-01-26 | Payer: PRIVATE HEALTH INSURANCE

## 2024-01-26 ENCOUNTER — Ambulatory Visit: Admit: 2024-01-26 | Discharge: 2024-01-26 | Payer: MEDICARE

## 2024-01-26 DIAGNOSIS — C20 Malignant neoplasm of rectum: Principal | ICD-10-CM

## 2024-01-26 DIAGNOSIS — E109 Type 1 diabetes mellitus without complications: Principal | ICD-10-CM

## 2024-01-26 LAB — CBC W/ AUTO DIFF
BASOPHILS ABSOLUTE COUNT: 0 10*9/L (ref 0.0–0.1)
BASOPHILS RELATIVE PERCENT: 0.5 %
EOSINOPHILS ABSOLUTE COUNT: 0.1 10*9/L (ref 0.0–0.5)
EOSINOPHILS RELATIVE PERCENT: 1.7 %
HEMATOCRIT: 44 % (ref 39.0–48.0)
HEMOGLOBIN: 14.9 g/dL (ref 12.9–16.5)
LYMPHOCYTES ABSOLUTE COUNT: 2.2 10*9/L (ref 1.1–3.6)
LYMPHOCYTES RELATIVE PERCENT: 37.9 %
MEAN CORPUSCULAR HEMOGLOBIN CONC: 34 g/dL (ref 32.0–36.0)
MEAN CORPUSCULAR HEMOGLOBIN: 29.7 pg (ref 25.9–32.4)
MEAN CORPUSCULAR VOLUME: 87.4 fL (ref 77.6–95.7)
MEAN PLATELET VOLUME: 9.5 fL (ref 6.8–10.7)
MONOCYTES ABSOLUTE COUNT: 0.4 10*9/L (ref 0.3–0.8)
MONOCYTES RELATIVE PERCENT: 6.7 %
NEUTROPHILS ABSOLUTE COUNT: 3 10*9/L (ref 1.8–7.8)
NEUTROPHILS RELATIVE PERCENT: 53.2 %
NUCLEATED RED BLOOD CELLS: 0 /100{WBCs} (ref <=4)
PLATELET COUNT: 171 10*9/L (ref 150–450)
RED BLOOD CELL COUNT: 5.04 10*12/L (ref 4.26–5.60)
RED CELL DISTRIBUTION WIDTH: 13.8 % (ref 12.2–15.2)
WBC ADJUSTED: 5.7 10*9/L (ref 3.6–11.2)

## 2024-01-26 LAB — COMPREHENSIVE METABOLIC PANEL
ALBUMIN: 3.7 g/dL (ref 3.4–5.0)
ALKALINE PHOSPHATASE: 114 U/L (ref 46–116)
ALT (SGPT): 48 U/L (ref 10–49)
ANION GAP: 12 mmol/L (ref 5–14)
AST (SGOT): 32 U/L (ref <=34)
BILIRUBIN TOTAL: 0.8 mg/dL (ref 0.3–1.2)
BLOOD UREA NITROGEN: 18 mg/dL (ref 9–23)
BUN / CREAT RATIO: 17
CALCIUM: 9 mg/dL (ref 8.7–10.4)
CHLORIDE: 102 mmol/L (ref 98–107)
CO2: 27.9 mmol/L (ref 20.0–31.0)
CREATININE: 1.04 mg/dL (ref 0.73–1.18)
EGFR CKD-EPI (2021) MALE: 85 mL/min/1.73m2 (ref >=60)
GLUCOSE RANDOM: 135 mg/dL (ref 70–179)
POTASSIUM: 4.7 mmol/L (ref 3.4–4.8)
PROTEIN TOTAL: 7.2 g/dL (ref 5.7–8.2)
SODIUM: 142 mmol/L (ref 135–145)

## 2024-01-26 LAB — CEA: CARCINOEMBRYONIC ANTIGEN: <2 ng/mL (ref 0.0–5.0)

## 2024-01-26 MED ORDER — TRIAMCINOLONE ACETONIDE 0.1 % TOPICAL CREAM
Freq: Two times a day (BID) | TOPICAL | 1 refills | 0.00000 days | Status: CP
Start: 2024-01-26 — End: 2025-01-25

## 2024-01-26 MED ADMIN — gadopiclenol (ELUCIREM,VUEWAY) injection 10 mL: 10 mL | INTRAVENOUS | @ 15:00:00 | Stop: 2024-01-26

## 2024-01-26 MED ADMIN — heparin, porcine (PF) 100 unit/mL injection 500 Units: 500 [IU] | INTRAVENOUS | @ 19:00:00 | Stop: 2024-01-26

## 2024-01-26 MED ADMIN — trastuzumab-qyyp (TRAZIMERA) 600 mg in sodium chloride (NS) 0.9 % 250 mL IVPB: 6 mg/kg | INTRAVENOUS | @ 18:00:00 | Stop: 2024-01-26

## 2024-01-26 NOTE — Progress Notes (Signed)
 [facility]     CONSULTING PROVIDERS  Dr. Franky Sprinkles - Radiation Oncology  Dr. Morene Freed, [facility] Surgical Oncology   Dr. 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 FOLFOX x 8  05/2018 chemoradiotherapy to primary.   -Clinical complete response in liver and rectum, managed with Watch and Wait approach  03/2019 recurrent disease in rectum   05/2019 proctectomy with coloanal anastomosis, liver wedge resection (seg 3): liver with fibrosis, rectum invasive mod to poorly differentiated adenocarcinoma, 3 cm; into muscularis propria, no LVI, tumor at distal margin in area of tearing superceded by additional specimen, 0/16 nodes, two with prominent treatment effect. YpT2N0.  - complicated by abdominal abscess with MRSA  08/02/19 closure of diverting ileostomy  02/2020 2 sites of recurrence disease in liver adjacent to GB fossa  04/14/20 seg 4b wedge resection: two foci of adenocarcinoma c/w colorectal primary. Margin +; node negative   01/2021 SBRT 5000cGy in 73fx to met near GB fossa  06/2021 -09/2021 pelvic + possible right abd wall recurrence FOLFOX x 6  11/05/2021 tucatinib -trastuzumab  start [baseline echo nl]  - c1 LFTs 4x ULN. Improved quickly, resumed at tucatinib  250 bid    MOLECULAR PROFILE   TEMPUS from 06/2021 rectal mass biopsy:  HER2 amplified  TP53 and APC mutation  RAS/RAF WT  TMB 6.3, MSS    Treatment plan:  tucatinib  + trastuzumab   ___________________________________________________________________    ASSESSMENT  1. 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 Stable    2. Type I DM- controlled on insulin  pump, no complications from DM  3. Intermittent transaminitis, possibly tucatinib  related. CTM.   4. ECOG 0    RECOMMENDATIONS  1. Continue Tucatinib  250 mg BID + trastuzumab  without dose adjustment  2. Kenalog  to rash- encouraged to stop picking at it  3. RTC 12 weeks for scans + visit + treatment       RECOMMENDATIONS  Scans look good- stalbe presacral mass and abd wall met. Indeterminate right omental finding is stable. Nothing in chest.   The rash is likely from tucatinib . Looks pretty inflammed right now though some may be just because he is picking at it  Currently looks like he is tolerating treatment well. No dose mod or adjustemnt    This was high risk medical decision making on the basis of advanced cancer and treatment with high risk for complication requiring ongoing careful monitoring for severe toxicity. '    ________________________________________________________    Interval History  Mr. Eber returns today for ongoing management of his rectal cancer with liver mets.  History of Present Illness  Notes he has itchy, crusted nodules /scabs in his beard under his chin. He picks at them a lot. Wonders if it is related to tucatinib   Otherwise he is fine. Continues to work, eat normally. Bowels always abnormal but stable.     MEDICAL HISTORY  1. Type I DM- diagnosed at age 21, on insulin      SOCIAL HISTORY   Denies smoking and drugs. Drinks occasional alcohol. 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 He lives with his wife and two daughters     PHYSICAL EXAM  There were no vitals filed for this visit.  GENERAL: well-developed, in no distress  PSYCH: full and appropriate range  of affect with good insight and judgement  EXT: warm and well perfused, no edema  SKIN: crusted papules under chin, mildly erythematous. Otherwise is skin is normal.     OBJECTIVE DATA  Reviewed in EMR       RADIOLOGY RESULTS    Scans look good- stalbe presacral mass and abd wall met. Indeterminate right omental finding is stable. Nothing in chest.

## 2024-01-26 NOTE — Progress Notes (Signed)
 Access:  PORT    Labs: Within parameters    Patient reported new or worsening symptoms (if yes, explanation needed): No    Provider Notified: N/A    Ok to Treat given: Yes    Dignicap: No    Treatment Administered : Trastuzumab      Patient Education Performed (if yes,explanation needed): No    Patient/Patient's Representative verbalized understanding of the education provided, with no additional questions or concerns at this time. :Yes    Time Spent on Patient Education: 0 min    Pre-Chemotherapy Blood Return : Yes    Hypersensitivity Reaction (if yes, explanation needed): No    Post Chemotherapy Blood Return: Yes    Heparin  Locked: Yes    Dressing: Gauze/Band-aid    Stable Discharge (if no explanation needed): Yes

## 2024-01-31 DIAGNOSIS — C20 Malignant neoplasm of rectum: Principal | ICD-10-CM

## 2024-02-14 DIAGNOSIS — C20 Malignant neoplasm of rectum: Principal | ICD-10-CM

## 2024-02-14 NOTE — Progress Notes (Deleted)
 02/14/2024 - Patient originally requested delivery for 02/14/2024. Delivery is not possible on this date due to delay in dispensing. I have reached out to the patient and confirmed that delivery on 02/16/2024 is ok.    [facility] Refill Coordination Note    Max Stewart, DOB: 1970-07-26  Phone: [phone] (home) [phone] (work)      All above HIPAA information was verified with patient.         02/13/2024    11:33 PM   Specialty Rx Medication Refill Questionnaire   Which Medications would you like refilled and shipped? Tuysa 150, Tukysa  50, 2 days left   Please list all current allergies: Shellfish. Cipro   Have you missed any doses in the last 30 days? No   Have you had any changes to your medication(s) since your last refill? No   How much of each medication do you have remaining at home? (eg. number of tablets, injections, etc.) 4   Have you experienced any side effects in the last 30 days? No   Please enter the full address (street address, city, state, zip code) where you would like your medication(s) to be delivered to. [address] , [place] [address]   Please specify on which day you would like your medication(s) to arrive. Note: if you need your medication(s) within 3 days, please call the pharmacy to schedule your order at [phone]  02/15/2024   Has your insurance changed since your last refill? No   Would you like a pharmacist to call you to discuss your medication(s)? No   Do you require a signature for your package? (Note: if we are billing Medicare Part B or your order contains a controlled substance, we will require a signature) No   I have been provided my out of pocket cost for my medication and approve the pharmacy to charge the amount to my credit card on file. Yes         Completed refill call assessment today to schedule patient's medication shipment from the [facility] Delivery Pharm[facility] [phone]).  All relevant notes have been reviewed.       Confirmed patient received a Conservation Officer, Historic Buildings and a Surveyor, Mining with first shipment. The patient will receive a drug information handout for each medication shipped and additional FDA Medication Guides as required.         REFERRAL TO PHARMACIST     Referral to the pharmacist: Not needed      [facility]     Shipping address confirmed in Epic.     Delivery Scheduled: Yes, Expected medication delivery date: 02/16/2024.     Medication will be delivered via Next Day Courier to the prescription address in Epic [facility].    Max Stewart Max Dross   [facility] Specialty Technician

## 2024-02-15 DIAGNOSIS — C20 Malignant neoplasm of rectum: Principal | ICD-10-CM

## 2024-02-15 MED FILL — TUKYSA 150 MG TABLET: ORAL | 30 days supply | Qty: 60 | Fill #4

## 2024-02-15 NOTE — Progress Notes (Signed)
 02/15/2024 - Patient originally requested delivery for 02/15/2024. Delivery is not possible on this date due to insufficient inventory. I have reached out to the patient and confirmed that delivery on 02/16/2024 for Tukysa  150 mg and 02/18/2024 for Tukysa  50 mg is ok.    [facility] Delivery Pharm[facility] Refill Coordination Note    Max Stewart, DOB: Nov 29, 1970  Phone: [phone] (home) [phone] (work)      All above HIPAA information was verified with patient.         02/13/2024    11:33 PM   Specialty Rx Medication Refill Questionnaire   Which Medications would you like refilled and shipped? Tuysa 150, Tukysa  50, 2 days left   Please list all current allergies: Shellfish. Cipro   Have you missed any doses in the last 30 days? No   Have you had any changes to your medication(s) since your last refill? No   How much of each medication do you have remaining at home? (eg. number of tablets, injections, etc.) 4   Have you experienced any side effects in the last 30 days? No   Please enter the full address (street address, city, state, zip code) where you would like your medication(s) to be delivered to. [address] , [place] [address]   Please specify on which day you would like your medication(s) to arrive. Note: if you need your medication(s) within 3 days, please call the pharmacy to schedule your order at [phone]  02/15/2024   Has your insurance changed since your last refill? No   Would you like a pharmacist to call you to discuss your medication(s)? No   Do you require a signature for your package? (Note: if we are billing Medicare Part B or your order contains a controlled substance, we will require a signature) No   I have been provided my out of pocket cost for my medication and approve the pharmacy to charge the amount to my credit card on file. Yes         Completed refill call assessment today to schedule patient's medication shipment from the [facility] and Home Delivery Pharmacy [phone]).  All relevant notes have been reviewed.       Confirmed patient received a Conservation Officer, Historic Buildings and a Surveyor, Mining with first shipment. The patient will receive a drug information handout for each medication shipped and additional FDA Medication Guides as required.         REFERRAL TO PHARMACIST     Referral to the pharmacist: Not needed      [facility]     Shipping address confirmed in Epic.     Delivery Scheduled: Yes, Expected medication delivery date: 02/16/2024 for Tukysa  150 mg and 02/18/2024 Tukysa  50 mg.     Medication will be delivered via Next Day Courier to the prescription address in Epic [facility].    Max Stewart   [facility] Delivery Pharm[facility] Specialty Technician

## 2024-02-16 ENCOUNTER — Encounter: Admit: 2024-02-16 | Discharge: 2024-02-17 | Payer: PRIVATE HEALTH INSURANCE

## 2024-02-16 ENCOUNTER — Ambulatory Visit: Admit: 2024-02-16 | Discharge: 2024-02-17 | Payer: PRIVATE HEALTH INSURANCE

## 2024-02-16 DIAGNOSIS — C20 Malignant neoplasm of rectum: Principal | ICD-10-CM

## 2024-02-16 LAB — CBC W/ AUTO DIFF
BASOPHILS ABSOLUTE COUNT: 0 10*9/L (ref 0.0–0.1)
BASOPHILS RELATIVE PERCENT: 0.7 %
EOSINOPHILS ABSOLUTE COUNT: 0.1 10*9/L (ref 0.0–0.5)
EOSINOPHILS RELATIVE PERCENT: 2 %
HEMATOCRIT: 42.2 % (ref 39.0–48.0)
HEMOGLOBIN: 14.4 g/dL (ref 12.9–16.5)
LYMPHOCYTES ABSOLUTE COUNT: 2 10*9/L (ref 1.1–3.6)
LYMPHOCYTES RELATIVE PERCENT: 32.3 %
MEAN CORPUSCULAR HEMOGLOBIN CONC: 34.1 g/dL (ref 32.0–36.0)
MEAN CORPUSCULAR HEMOGLOBIN: 29.8 pg (ref 25.9–32.4)
MEAN CORPUSCULAR VOLUME: 87.5 fL (ref 77.6–95.7)
MEAN PLATELET VOLUME: 9.5 fL (ref 6.8–10.7)
MONOCYTES ABSOLUTE COUNT: 0.4 10*9/L (ref 0.3–0.8)
MONOCYTES RELATIVE PERCENT: 7.4 %
NEUTROPHILS ABSOLUTE COUNT: 3.5 10*9/L (ref 1.8–7.8)
NEUTROPHILS RELATIVE PERCENT: 57.6 %
NUCLEATED RED BLOOD CELLS: 0 /100{WBCs} (ref <=4)
PLATELET COUNT: 161 10*9/L (ref 150–450)
RED BLOOD CELL COUNT: 4.82 10*12/L (ref 4.26–5.60)
RED CELL DISTRIBUTION WIDTH: 13.6 % (ref 12.2–15.2)
WBC ADJUSTED: 6 10*9/L (ref 3.6–11.2)

## 2024-02-16 LAB — COMPREHENSIVE METABOLIC PANEL
ALBUMIN: 3.7 g/dL (ref 3.4–5.0)
ALKALINE PHOSPHATASE: 106 U/L (ref 46–116)
ALT (SGPT): 20 U/L (ref 10–49)
ANION GAP: 13 mmol/L (ref 5–14)
AST (SGOT): 27 U/L (ref <=34)
BILIRUBIN TOTAL: 0.7 mg/dL (ref 0.3–1.2)
BLOOD UREA NITROGEN: 20 mg/dL (ref 9–23)
BUN / CREAT RATIO: 20
CALCIUM: 9.1 mg/dL (ref 8.7–10.4)
CHLORIDE: 101 mmol/L (ref 98–107)
CO2: 28.4 mmol/L (ref 20.0–31.0)
CREATININE: 1 mg/dL (ref 0.73–1.18)
EGFR CKD-EPI (2021) MALE: 89 mL/min/1.73m2 (ref >=60)
GLUCOSE RANDOM: 99 mg/dL (ref 70–179)
POTASSIUM: 4.2 mmol/L (ref 3.4–4.8)
PROTEIN TOTAL: 7.2 g/dL (ref 5.7–8.2)
SODIUM: 142 mmol/L (ref 135–145)

## 2024-02-16 LAB — CEA: CARCINOEMBRYONIC ANTIGEN: 2 ng/mL (ref 0.0–5.0)

## 2024-02-16 MED ADMIN — heparin, porcine (PF) 100 unit/mL injection 500 Units: 500 [IU] | INTRAVENOUS | @ 20:00:00 | Stop: 2024-02-16

## 2024-02-16 MED ADMIN — trastuzumab-qyyp (TRAZIMERA) 600 mg in sodium chloride (NS) 0.9 % 250 mL IVPB: 6 mg/kg | INTRAVENOUS | @ 20:00:00 | Stop: 2024-02-16

## 2024-02-16 NOTE — Progress Notes (Signed)
 Patient arrived in clinic in stable, ambulatory condition. Weight and vital signs recorded. He feels well for treatment today.     Access:  PORT - accessed by this RN, labs drawn, flushed with brisk blood return     Labs: N/A parameters - no parameters    Patient reported new or worsening symptoms (if yes, explanation needed): No    Provider Notified: N/A    Ok to Treat given: Yes    Dignicap: No    Treatment Administered : trastuzumab     Patient Education Performed (if yes,explanation needed): No    Patient/Patient's Representative verbalized understanding of the education provided, with no additional questions or concerns at this time. :N/A    Time Spent on Patient Education: 0 min    Pre-Chemotherapy Blood Return : Yes    Hypersensitivity Reaction (if yes, explanation needed): No    Post Chemotherapy Blood Return: Yes    Heparin  Locked: Yes    Dressing: band aid over port site    Stable Discharge (if no explanation needed): Yes    Patient was instructed to stop by the check out desk to complete today's visit and to receive a printed after visit summary then discharged home to self care.

## 2024-02-16 NOTE — Patient Instructions (Signed)
 Appointment on 02/16/2024   Component Date Value Ref Range Status    Sodium 02/16/2024 142  135 - 145 mmol/L Final    Potassium 02/16/2024 4.2  3.4 - 4.8 mmol/L Final    Chloride 02/16/2024 101  98 - 107 mmol/L Final    CO2 02/16/2024 28.4  20.0 - 31.0 mmol/L Final    Anion Gap 02/16/2024 13  5 - 14 mmol/L Final    BUN 02/16/2024 20  9 - 23 mg/dL Final    Creatinine 98/91/7973 1.00  0.73 - 1.18 mg/dL Final    BUN/Creatinine Ratio 02/16/2024 20   Final    eGFR CKD-EPI (2021) Male 02/16/2024 89  >=60 mL/min/1.68m2 Final    eGFR calculated with CKD-EPI 2021 equation in accordance with [employer] and [employer]ph[employer] Task Force recommendations.    Glucose 02/16/2024 99  70 - 179 mg/dL Final    Calcium 98/91/7973 9.1  8.7 - 10.4 mg/dL Final    Albumin 98/91/7973 3.7  3.4 - 5.0 g/dL Final    Total Protein 02/16/2024 7.2  5.7 - 8.2 g/dL Final    Total Bilirubin 02/16/2024 0.7  0.3 - 1.2 mg/dL Final    AST 98/91/7973 27  <=34 U/L Final    ALT 02/16/2024 20  10 - 49 U/L Final    Alkaline Phosphatase 02/16/2024 106  46 - 116 U/L Final    WBC 02/16/2024 6.0  3.6 - 11.2 10*9/L Final    RBC 02/16/2024 4.82  4.26 - 5.60 10*12/L Final    HGB 02/16/2024 14.4  12.9 - 16.5 g/dL Final    HCT 98/91/7973 42.2  39.0 - 48.0 % Final    MCV 02/16/2024 87.5  77.6 - 95.7 fL Final    MCH 02/16/2024 29.8  25.9 - 32.4 pg Final    MCHC 02/16/2024 34.1  32.0 - 36.0 g/dL Final    RDW 98/91/7973 13.6  12.2 - 15.2 % Final    MPV 02/16/2024 9.5  6.8 - 10.7 fL Final    Platelet 02/16/2024 161  150 - 450 10*9/L Final    nRBC 02/16/2024 0  <=4 /100 WBCs Final    Neutrophils % 02/16/2024 57.6  % Final    Lymphocytes % 02/16/2024 32.3  % Final    Monocytes % 02/16/2024 7.4  % Final    Eosinophils % 02/16/2024 2.0  % Final    Basophils % 02/16/2024 0.7  % Final    Absolute Neutrophils 02/16/2024 3.5  1.8 - 7.8 10*9/L Final    Absolute Lymphocytes 02/16/2024 2.0  1.1 - 3.6 10*9/L Final    Absolute Monocytes 02/16/2024 0.4  0.3 - 0.8 10*9/L Final    Absolute Eosinophils 02/16/2024 0.1  0.0 - 0.5 10*9/L Final    Absolute Basophils 02/16/2024 0.0  0.0 - 0.1 10*9/L Final

## 2024-02-17 MED FILL — TUKYSA 50 MG TABLET: ORAL | 30 days supply | Qty: 120 | Fill #4

## 2024-03-08 ENCOUNTER — Ambulatory Visit: Admit: 2024-03-08 | Discharge: 2024-03-09 | Payer: PRIVATE HEALTH INSURANCE

## 2024-03-08 ENCOUNTER — Encounter: Admit: 2024-03-08 | Discharge: 2024-03-09 | Payer: PRIVATE HEALTH INSURANCE

## 2024-03-08 DIAGNOSIS — C20 Malignant neoplasm of rectum: Principal | ICD-10-CM

## 2024-03-08 MED ADMIN — trastuzumab-qyyp (TRAZIMERA) 600 mg in sodium chloride (NS) 0.9 % 250 mL IVPB: 6 mg/kg | INTRAVENOUS | @ 20:00:00 | Stop: 2024-03-08

## 2024-03-08 MED ADMIN — heparin, porcine (PF) 100 unit/mL injection 500 Units: 500 [IU] | INTRAVENOUS | @ 21:00:00 | Stop: 2024-03-08

## 2024-03-08 NOTE — Progress Notes (Signed)
 Patient arrived in infusion in stable, ambulatory condition. Weight and vitals recorded. He feels well for treatment today.     Access:  PORT    Labs: N/A parameters    Patient reported new or worsening symptoms (if yes, explanation needed): No    Provider Notified: N/A    Ok to Treat given: Yes    Dignicap: No    Treatment Administered : trastuzumab     Pre-Chemotherapy Blood Return : Yes    Hypersensitivity Reaction (if yes, explanation needed): No    Post Chemotherapy Blood Return: Yes    Heparin  Locked: Yes    Dressing: band aid     Stable Discharge (if no explanation needed): Yes    Patient was instructed to stop by the check out desk to complete today's visit and to receive a printed after visit summary then discharged home to self care.

## 2024-03-09 DIAGNOSIS — C20 Malignant neoplasm of rectum: Principal | ICD-10-CM

## 2024-03-09 NOTE — Progress Notes (Signed)
 [name] Delivery Pharmacy Refill Coordination Note    Max Stewart, DOB: 1970/06/09  Phone: [phone] (home) [phone] (work)      All above HIPAA information was verified with patient.         03/08/2024     3:34 PM   Specialty Rx Medication Refill Questionnaire   Which Medications would you like refilled and shipped? Tukysa    Please list all current allergies: Shell fish cioro   Have you missed any doses in the last 30 days? No   Have you had any changes to your medication(s) since your last refill? No   How much of each medication do you have remaining at home? (eg. number of tablets, injections, etc.) 10   Have you experienced any side effects in the last 30 days? No   Please enter the full address (street address, city, state, zip code) where you would like your medication(s) to be delivered to. [address] [place] [address]   Please specify on which day you would like your medication(s) to arrive. Note: if you need your medication(s) within 3 days, please call the pharmacy to schedule your order at [phone]  03/14/2024   Has your insurance changed since your last refill? No   Would you like a pharmacist to call you to discuss your medication(s)? No   Do you require a signature for your package? (Note: if we are billing Medicare Part B or your order contains a controlled substance, we will require a signature) No   I have been provided my out of pocket cost for my medication and approve the pharmacy to charge the amount to my credit card on file. Yes         Completed refill call assessment today to schedule patient's medication shipment from the [facility] and [facility] Delivery Pharmacy [phone]).  All relevant notes have been reviewed.       Confirmed patient received a Conservation Officer, Historic Buildings and a Surveyor, Mining with first shipment. The patient will receive a drug information handout for each medication shipped and additional FDA Medication Guides as required. REFERRAL TO PHARMACIST     Referral to the pharmacist: Not needed      [facility]     Shipping address confirmed in Epic.     Delivery Scheduled: Yes, Expected medication delivery date: 03/14/24.     Medication will be delivered via Next Day Courier to the prescription address in Epic [facility].    [name]   [place] [facility] Specialty Technician

## 2024-03-13 MED FILL — TUKYSA 150 MG TABLET: ORAL | 30 days supply | Qty: 60 | Fill #5

## 2024-03-13 MED FILL — TUKYSA 50 MG TABLET: ORAL | 30 days supply | Qty: 120 | Fill #5
